# Patient Record
Sex: Female | Born: 1940 | Hispanic: No | Smoking: Never smoker
Health system: Southern US, Community
[De-identification: ages and names within clinical notes are randomized; demographics above are authoritative.]

## PROBLEM LIST (undated history)

## (undated) DIAGNOSIS — M1990 Unspecified osteoarthritis, unspecified site: Secondary | ICD-10-CM

## (undated) DIAGNOSIS — I10 Essential (primary) hypertension: Secondary | ICD-10-CM

## (undated) DIAGNOSIS — J45909 Unspecified asthma, uncomplicated: Secondary | ICD-10-CM

## (undated) DIAGNOSIS — G1221 Amyotrophic lateral sclerosis: Secondary | ICD-10-CM

## (undated) DIAGNOSIS — K5900 Constipation, unspecified: Secondary | ICD-10-CM

## (undated) DIAGNOSIS — E079 Disorder of thyroid, unspecified: Secondary | ICD-10-CM

## (undated) HISTORY — PX: KNEE ARTHROSCOPY: SHX127

## (undated) HISTORY — PX: JOINT REPLACEMENT: SHX530

## (undated) HISTORY — PX: APPENDECTOMY: SHX54

## (undated) HISTORY — PX: BACK SURGERY: SHX140

---

## 1998-07-23 ENCOUNTER — Inpatient Hospital Stay (HOSPITAL_COMMUNITY): Admission: RE | Admit: 1998-07-23 | Discharge: 1998-07-27 | Payer: Self-pay | Admitting: Neurosurgery

## 1999-09-22 ENCOUNTER — Encounter: Admission: RE | Admit: 1999-09-22 | Discharge: 1999-09-22 | Payer: Self-pay | Admitting: Neurosurgery

## 1999-09-22 ENCOUNTER — Encounter: Payer: Self-pay | Admitting: Neurosurgery

## 2000-07-21 ENCOUNTER — Ambulatory Visit (HOSPITAL_COMMUNITY): Admission: RE | Admit: 2000-07-21 | Discharge: 2000-07-21 | Payer: Self-pay | Admitting: Gastroenterology

## 2000-08-25 ENCOUNTER — Encounter: Payer: Self-pay | Admitting: Gastroenterology

## 2000-08-25 ENCOUNTER — Ambulatory Visit (HOSPITAL_COMMUNITY): Admission: RE | Admit: 2000-08-25 | Discharge: 2000-08-25 | Payer: Self-pay | Admitting: Gastroenterology

## 2001-06-29 ENCOUNTER — Encounter: Payer: Self-pay | Admitting: Internal Medicine

## 2001-06-29 ENCOUNTER — Ambulatory Visit (HOSPITAL_COMMUNITY): Admission: RE | Admit: 2001-06-29 | Discharge: 2001-06-29 | Payer: Self-pay | Admitting: Internal Medicine

## 2004-12-29 ENCOUNTER — Ambulatory Visit (HOSPITAL_COMMUNITY): Admission: RE | Admit: 2004-12-29 | Discharge: 2004-12-29 | Payer: Self-pay | Admitting: Obstetrics and Gynecology

## 2005-01-05 ENCOUNTER — Ambulatory Visit: Payer: Self-pay | Admitting: Orthopedic Surgery

## 2005-07-06 ENCOUNTER — Ambulatory Visit: Payer: Self-pay | Admitting: Orthopedic Surgery

## 2005-07-14 ENCOUNTER — Ambulatory Visit: Payer: Self-pay | Admitting: Orthopedic Surgery

## 2005-07-14 ENCOUNTER — Ambulatory Visit (HOSPITAL_COMMUNITY): Admission: RE | Admit: 2005-07-14 | Discharge: 2005-07-14 | Payer: Self-pay | Admitting: Orthopedic Surgery

## 2005-07-16 ENCOUNTER — Ambulatory Visit: Payer: Self-pay | Admitting: Orthopedic Surgery

## 2005-07-17 ENCOUNTER — Encounter (HOSPITAL_COMMUNITY): Admission: RE | Admit: 2005-07-17 | Discharge: 2005-08-16 | Payer: Self-pay | Admitting: Orthopedic Surgery

## 2005-08-06 ENCOUNTER — Ambulatory Visit: Payer: Self-pay | Admitting: Orthopedic Surgery

## 2005-08-18 ENCOUNTER — Encounter (HOSPITAL_COMMUNITY): Admission: RE | Admit: 2005-08-18 | Discharge: 2005-08-29 | Payer: Self-pay | Admitting: Orthopedic Surgery

## 2005-08-31 ENCOUNTER — Ambulatory Visit: Payer: Self-pay | Admitting: Orthopedic Surgery

## 2005-10-12 ENCOUNTER — Ambulatory Visit: Payer: Self-pay | Admitting: Orthopedic Surgery

## 2005-10-19 ENCOUNTER — Ambulatory Visit: Payer: Self-pay | Admitting: Orthopedic Surgery

## 2005-10-26 ENCOUNTER — Ambulatory Visit: Payer: Self-pay | Admitting: Orthopedic Surgery

## 2005-11-19 ENCOUNTER — Ambulatory Visit: Payer: Self-pay | Admitting: Orthopedic Surgery

## 2005-12-10 ENCOUNTER — Ambulatory Visit: Payer: Self-pay | Admitting: Orthopedic Surgery

## 2005-12-23 ENCOUNTER — Ambulatory Visit: Payer: Self-pay | Admitting: Orthopedic Surgery

## 2005-12-30 ENCOUNTER — Ambulatory Visit: Payer: Self-pay | Admitting: Orthopedic Surgery

## 2006-07-22 ENCOUNTER — Encounter: Payer: Self-pay | Admitting: Orthopedic Surgery

## 2006-07-22 ENCOUNTER — Ambulatory Visit (HOSPITAL_COMMUNITY): Admission: RE | Admit: 2006-07-22 | Discharge: 2006-07-22 | Payer: Self-pay | Admitting: Internal Medicine

## 2008-02-10 ENCOUNTER — Other Ambulatory Visit: Admission: RE | Admit: 2008-02-10 | Discharge: 2008-02-10 | Payer: Self-pay | Admitting: Obstetrics & Gynecology

## 2008-04-02 ENCOUNTER — Ambulatory Visit (HOSPITAL_COMMUNITY): Admission: RE | Admit: 2008-04-02 | Discharge: 2008-04-02 | Payer: Self-pay | Admitting: Cardiology

## 2008-06-25 ENCOUNTER — Ambulatory Visit: Payer: Self-pay | Admitting: Orthopedic Surgery

## 2008-06-25 DIAGNOSIS — M1710 Unilateral primary osteoarthritis, unspecified knee: Secondary | ICD-10-CM

## 2008-06-25 DIAGNOSIS — IMO0002 Reserved for concepts with insufficient information to code with codable children: Secondary | ICD-10-CM | POA: Insufficient documentation

## 2008-06-25 DIAGNOSIS — M25559 Pain in unspecified hip: Secondary | ICD-10-CM | POA: Insufficient documentation

## 2008-06-25 DIAGNOSIS — M76899 Other specified enthesopathies of unspecified lower limb, excluding foot: Secondary | ICD-10-CM | POA: Insufficient documentation

## 2008-07-20 ENCOUNTER — Encounter: Payer: Self-pay | Admitting: Orthopedic Surgery

## 2011-02-17 ENCOUNTER — Emergency Department (HOSPITAL_BASED_OUTPATIENT_CLINIC_OR_DEPARTMENT_OTHER): Payer: Medicare Other

## 2011-02-17 ENCOUNTER — Emergency Department (HOSPITAL_BASED_OUTPATIENT_CLINIC_OR_DEPARTMENT_OTHER)
Admission: EM | Admit: 2011-02-17 | Discharge: 2011-02-17 | Disposition: A | Discharge status: Home / Self Care | Payer: Medicare Other | Attending: Emergency Medicine | Admitting: Emergency Medicine

## 2011-02-17 DIAGNOSIS — M79609 Pain in unspecified limb: Secondary | ICD-10-CM

## 2011-02-17 DIAGNOSIS — E039 Hypothyroidism, unspecified: Secondary | ICD-10-CM | POA: Insufficient documentation

## 2011-02-17 DIAGNOSIS — R079 Chest pain, unspecified: Secondary | ICD-10-CM

## 2011-02-17 DIAGNOSIS — I10 Essential (primary) hypertension: Secondary | ICD-10-CM | POA: Insufficient documentation

## 2011-02-17 DIAGNOSIS — Z79899 Other long term (current) drug therapy: Secondary | ICD-10-CM | POA: Insufficient documentation

## 2011-02-17 DIAGNOSIS — K219 Gastro-esophageal reflux disease without esophagitis: Secondary | ICD-10-CM | POA: Insufficient documentation

## 2011-02-17 DIAGNOSIS — Z8739 Personal history of other diseases of the musculoskeletal system and connective tissue: Secondary | ICD-10-CM | POA: Insufficient documentation

## 2011-02-17 LAB — DIFFERENTIAL
Basophils Relative: 0 % (ref 0–1)
Eosinophils Absolute: 0.2 10*3/uL (ref 0.0–0.7)
Eosinophils Relative: 2 % (ref 0–5)
Lymphs Abs: 2.5 10*3/uL (ref 0.7–4.0)
Monocytes Relative: 9 % (ref 3–12)

## 2011-02-17 LAB — BASIC METABOLIC PANEL
BUN: 23 mg/dL (ref 6–23)
Creatinine, Ser: 0.7 mg/dL (ref 0.4–1.2)
GFR calc Af Amer: >60 mL/min (ref >60)
GFR calc non Af Amer: >60 mL/min (ref >60)
Potassium: 3.7 mEq/L (ref 3.5–5.1)

## 2011-02-17 LAB — POCT CARDIAC MARKERS: Myoglobin, poc: 61.6 ng/mL (ref 12–200)

## 2011-02-17 LAB — CBC
MCH: 29.4 pg (ref 26.0–34.0)
MCHC: 34.9 g/dL (ref 30.0–36.0)
MCV: 84.3 fL (ref 78.0–100.0)
Platelets: 281 10*3/uL (ref 150–400)
RDW: 13.6 % (ref 11.5–15.5)

## 2011-03-06 ENCOUNTER — Inpatient Hospital Stay (HOSPITAL_BASED_OUTPATIENT_CLINIC_OR_DEPARTMENT_OTHER)
Admission: RE | Admit: 2011-03-06 | Discharge: 2011-03-06 | Disposition: A | Discharge status: Home / Self Care | Payer: Medicare Other | Source: Ambulatory Visit | Attending: Cardiology | Admitting: Cardiology

## 2011-03-06 DIAGNOSIS — R072 Precordial pain: Secondary | ICD-10-CM | POA: Insufficient documentation

## 2011-03-06 DIAGNOSIS — I2510 Atherosclerotic heart disease of native coronary artery without angina pectoris: Secondary | ICD-10-CM | POA: Insufficient documentation

## 2011-03-09 ENCOUNTER — Other Ambulatory Visit (HOSPITAL_COMMUNITY): Payer: Self-pay | Admitting: Gastroenterology

## 2011-03-09 DIAGNOSIS — R110 Nausea: Secondary | ICD-10-CM

## 2011-03-10 ENCOUNTER — Inpatient Hospital Stay (HOSPITAL_BASED_OUTPATIENT_CLINIC_OR_DEPARTMENT_OTHER): Admission: RE | Admit: 2011-03-10 | Payer: Medicare Other | Source: Ambulatory Visit | Admitting: Cardiology

## 2011-03-17 ENCOUNTER — Other Ambulatory Visit (HOSPITAL_COMMUNITY): Payer: Medicare Other

## 2011-04-02 NOTE — Cardiovascular Report (Signed)
NAMEJIZELLE, Rebecca Osborn                ACCOUNT NO.:  [id number]  MEDICAL RECORD NO.:  [id number]          PATIENT TYPE:  LOCATION:                                 FACILITY:  PHYSICIAN:  Finnis Colee N. Sharyn Lull, M.D. DATE OF BIRTH:  March 23, 1941  DATE OF PROCEDURE:  03/06/2011 DATE OF DISCHARGE:                           CARDIAC CATHETERIZATION   PROCEDURE:  Left cardiac catheterization with selective left and right coronary angiography, left ventriculography graft via right groin using Judkins technique.  INDICATIONS FOR PROCEDURE:  Rebecca Osborn is a 70 year old Asian female with past medical history significant for hypertension, hypothyroidism, GERD, and positive family history of coronary artery disease who complains of recurrent left infraclavicular and left precordial chest pain and left arm pain off and on.  Denies any nausea, vomiting, or diaphoresis.  Also complains of exertional dyspnea with minimal exertion.  Denies PND, orthopnea, or leg swelling.  Denies palpitation, lightheadedness, or syncope.  The patient had Persantine Myoview in May 2009 which was positive by EKG criteria, but showed no evidence of reversible ischemia on nuclear scan and was treated medically, but due to recurrent chest pain and left arm pain, the patient is referred for left cath.  PROCEDURE:  After obtaining informed consent, the patient was brought to the Cath Lab and was placed on fluoroscopy table.  Right groin was prepped and draped in the usual fashion.  Xylocaine 1% was used for local anesthesia in the right groin.  With the help of thin-wall needle, a 4-French arterial sheath was placed.  The sheath was aspirated and flushed.  Next, 4-French left Judkins catheter was advanced over the wire under fluoroscopic guidance up to the ascending aorta.  Wire was pulled out.  The catheter was aspirated and connected to the manifold. Catheter was further advanced and engaged into left coronary ostium. Multiple  views of the left system were taken.  Next, catheter was disengaged and was pulled out over the wire and was replaced with 4- [place] 3-D right diagnostic catheter which was advanced over the wire under fluoroscopic guidance up to the ascending aorta.  Wire was pulled out.  The catheter was aspirated and connected to the manifold. Catheter was further advanced and engaged into right coronary ostium. Multiple views of the right system were taken.  Next, the catheter was disengaged and was pulled out over the wire and was replaced with 4- [place] pigtail catheter which was advanced over the wire under fluoroscopic guidance up to the ascending aorta.  Wire was pulled out. The catheter was aspirated and connected to the manifold.  Catheter was further advanced across aortic valve into the LV.  LV pressures were recorded.  Next, LV-graphy was done in 30-degree RAO position.  Post- angiographic pressures were recorded from LV and then pullback pressures were recorded from the aorta.  There was no gradient across aortic valve.  Next, pigtail catheter was pulled out over the wire.  Sheaths were aspirated and flushed.  FINDINGS:  LV showed good LV systolic function.  EF of 55-60%.  Left main was patent.  LAD has 5-10% mid stenosis.  Diagonal 1 to diagonal 3  were small which were patent.  Left circumflex was large which was patent which tapers down in AV groove after giving off OM-2.  OM-1 is very, very small.  OM-2 is moderate size which is patent, which bifurcates in inferior and superior branch.  RCA is patent.  PDA and PLV branches are patent.  The patient tolerated the procedure well.  There are no complications.  The patient was transferred to the recovery room in stable condition.     Eduardo Osier. Sharyn Lull, M.D.     MNH/MEDQ  D:  03/06/2011  T:  03/07/2011  Job:  [id number]  Electronically Signed by Rinaldo Cloud M.D. on 04/02/2011 08:33:14 AM

## 2011-04-17 NOTE — Op Note (Signed)
[name], [name]                ACCOUNT NO.:  [id number]   MEDICAL RECORD NO.:  [id number]          PATIENT TYPE:  AMB   LOCATION:  DAY                           FACILITY:  [facility]   PHYSICIAN:  Vickki Hearing, M.D.DATE OF BIRTH:  [birth year]   DATE OF PROCEDURE:  07/14/2005  DATE OF DISCHARGE:  07/14/2005                                 OPERATIVE REPORT   PREOPERATIVE DIAGNOSIS:  Medial meniscal tear, left knee.   POSTOPERATIVE DIAGNOSIS:  Osteoarthritis, left knee.   PROCEDURE:  Arthroscopy, left knee, limited synovectomy.   SURGEON:  Dr. Romeo Apple.   ANESTHETIC:  General.   OPERATIVE FINDINGS:  The patient had complete bare bone on the trochlear  portion of her femur. Medial femoral condyle had grade 1 changes. The  patella had grade 1 changes. The entire rest of the knee was normal. There  were no loose bodies, but there were two pedunculated cartilaginous  structures which were attached to the synovium which were removed.   The patient identified as Rebecca Osborn, left knee marked for surgery,  countersigned by the surgeon, and history and physical updated. She was  taken to the operating room for general anesthesia. Left leg was prepped and  draped in sterile technique. Time-out was taken as required. Two-portal  arthroscopy was done. Diagnostic arthroscopy was performed first. Probe was  placed medially. Structures were palpated. We also looked through the notch  posteriorly to look for loose bodies. We only found two enlarged structures  on the medial side which appeared to be pedunculated and appeared to have  the potential to become loose bodies. We removed those. We did a limited  synovectomy including removal of medial plica, irrigated the knee, and  closed with Steri-Strips and injected 30 cc of Marcaine plain. We also  addressed the knee sterilely, placed a CryoCuff, extubated the patient, and  took her to the recovery room in stable condition      Vickki Hearing, M.D.  Electronically Signed     SEH/MEDQ  D:  07/15/2005  T:  07/16/2005  Job:  [id number]

## 2011-04-17 NOTE — Procedures (Signed)
[place]. [facility]  Patient:    Rebecca Osborn, Rebecca Osborn                       MRN: [id number] Proc. Date: 07/21/00 Adm. Date:  [id number] Attending:  Charna Elizabeth CC:         Dr. Theodis Blaze, [place], [place]   Procedure Report  DATE OF BIRTH:  May 13, 1942  PROCEDURE PERFORMED:  Colonoscopy.  ENDOSCOPIST:  Anselmo Rod, M.D.  INSTRUMENT USED:  Olympus video colonoscope.  INDICATIONS:  Rectal bleeding with history of chronic constipation in a 70 year old [place] female.  Rule out polyps, masses, hemorrhoids, etc.  PREPROCEDURE PREPARATION:  Informed consent was procured from the patient. The patient was fasted for 8 hours prior to the procedure and prepped with a bottle of magnesium citrate and a gallon of NuLytely the night prior to the procedure.  PREPROCEDURE PHYSICAL:  Patient has stable vital signs.  NECK: Supple.  CHEST:  Clear to auscultation. S1, S2 regular.  ABDOMEN:  Soft with normal abdominal bowel sounds.  DESCRIPTION OF PROCEDURE:  The patient was placed in left lateral decubitus position and sedated with 70 mg of Demerol and 7 mg of Versed intravenously. Once the patient was adequately sedated and maintained on low-flow oxygen and continuous cardiac monitoring, the Olympus video colonoscope was advanced from the rectum to the cecum without difficulty.  The patient had an excellent prep.  Small external hemorrhoids were appreciated on anal inspection and small nonbleeding internal hemorrhoids were seen on retroflexion.  The rest of the colonic mucosa appeared healthy up to the cecum.  The appendiceal orifice and the ileocecal valve were clearly visualized.  The patient had no evidence of masses, polyps, erosions, ulcerations or diverticular disease.  The patient tolerated the procedure well without complication.  IMPRESSION:  Normal colonoscopy except for small nonbleeding internal and external hemorrhoids.  RECOMMENDATIONS: 1. A  high-fiber diet has been recommended for the patient along with liberal    fluid intake. 2. Stool softeners are to be used at nighttime until her fiber intake is    maximized and bowel movements are regulated. 3. Outpatient follow-up is advised with Dr. Minus Liberty in the next 3-4 weeks. DD:  07/22/00 TD:  07/22/00 Job: [id number]

## 2011-04-17 NOTE — H&P (Signed)
Rebecca Osborn, Rebecca Osborn                ACCOUNT NO.:  [id number]   MEDICAL RECORD NO.:  [id number]          PATIENT TYPE:  AMB   LOCATION:  DAY                           FACILITY:  [facility]   PHYSICIAN:  Vickki Hearing, M.D.DATE OF BIRTH:  1941-01-17   DATE OF ADMISSION:  DATE OF DISCHARGE:  LH                                HISTORY & PHYSICAL   CHIEF COMPLAINT:  Pain and catching, left knee.   HISTORY:  She is 70 years old.  She had a lumbar fusion.  Has some right hip  bursitis and mechanical problems in the left knee with loose body documented  by MRI with more frequent giving away, and this has become worse since  February 2006.  She presents now for arthroscopy, left knee, removal of  loose body, and resection of torn medial meniscus.  She has occasionally in  the past complained of some shortness of breath.  She has some thyroid  disease.  Her other systems were normal.   She has no allergies.   She does take Prevacid, Zyrtec, thyroxine, verapamil.   She is status post appendectomy.   Family history of asthma.  Family physician is Dr. Felecia Shelling.   SOCIAL HISTORY:  She is married. 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  She does not  smoke or drink.  She does report some caffeine use via 3 cups of tea per  day.   PHYSICAL EXAMINATION:  VITAL SIGNS:  Her weight is 170.  Her pulse is 72,.  Her respiratory rate is 18.  GENERAL:  Her appearance is normal.  Body habitus is ectomorphic.  CARDIOVASCULAR:  No swelling or varicosities.  Pulses and temperature  normal.  LYMPH NODES:  Benign.  SKIN:  Normal.  NEUROPSYCH:  Exam shows no abnormal findings.  LUNGS:  Normal respirations with clear breath sounds.  HEART:  Rate and rhythm are normal.  MUSCULOSKELETAL:  Exam shows medial joint line tenderness.  Full range of  motion.  Good stability to the knee and tenderness along the medial  compartment.  There are some mild crepitations.   She had an MRI done several months ago, and it showed she had a  loose body.  We do not have that film with [place].  I did review it February 2006.   IMPRESSION:  Loose body, torn medial meniscus, left knee.   PLAN:  Arthroscopy, left knee.      Vickki Hearing, M.D.  Electronically Signed     SEH/MEDQ  D:  07/13/2005  T:  07/13/2005  Job:  [id number]

## 2011-12-08 DIAGNOSIS — R319 Hematuria, unspecified: Secondary | ICD-10-CM | POA: Diagnosis not present

## 2011-12-08 DIAGNOSIS — K5900 Constipation, unspecified: Secondary | ICD-10-CM | POA: Diagnosis not present

## 2011-12-08 DIAGNOSIS — R3915 Urgency of urination: Secondary | ICD-10-CM | POA: Diagnosis not present

## 2011-12-08 DIAGNOSIS — R350 Frequency of micturition: Secondary | ICD-10-CM | POA: Diagnosis not present

## 2011-12-30 DIAGNOSIS — Z1231 Encounter for screening mammogram for malignant neoplasm of breast: Secondary | ICD-10-CM | POA: Diagnosis not present

## 2011-12-30 DIAGNOSIS — Z13820 Encounter for screening for osteoporosis: Secondary | ICD-10-CM | POA: Diagnosis not present

## 2011-12-30 DIAGNOSIS — M899 Disorder of bone, unspecified: Secondary | ICD-10-CM | POA: Diagnosis not present

## 2011-12-30 DIAGNOSIS — M949 Disorder of cartilage, unspecified: Secondary | ICD-10-CM | POA: Diagnosis not present

## 2012-01-12 DIAGNOSIS — R3915 Urgency of urination: Secondary | ICD-10-CM | POA: Diagnosis not present

## 2012-01-12 DIAGNOSIS — R350 Frequency of micturition: Secondary | ICD-10-CM | POA: Diagnosis not present

## 2012-01-12 DIAGNOSIS — R319 Hematuria, unspecified: Secondary | ICD-10-CM | POA: Diagnosis not present

## 2012-01-12 DIAGNOSIS — K5900 Constipation, unspecified: Secondary | ICD-10-CM | POA: Diagnosis not present

## 2012-01-12 DIAGNOSIS — D259 Leiomyoma of uterus, unspecified: Secondary | ICD-10-CM | POA: Diagnosis not present

## 2012-01-12 DIAGNOSIS — R109 Unspecified abdominal pain: Secondary | ICD-10-CM | POA: Diagnosis not present

## 2012-02-09 ENCOUNTER — Ambulatory Visit: Payer: Self-pay | Admitting: Internal Medicine

## 2012-02-09 DIAGNOSIS — G4700 Insomnia, unspecified: Secondary | ICD-10-CM | POA: Diagnosis not present

## 2012-02-09 DIAGNOSIS — J45909 Unspecified asthma, uncomplicated: Secondary | ICD-10-CM | POA: Diagnosis not present

## 2012-02-09 DIAGNOSIS — M1990 Unspecified osteoarthritis, unspecified site: Secondary | ICD-10-CM | POA: Diagnosis not present

## 2012-02-09 DIAGNOSIS — M76899 Other specified enthesopathies of unspecified lower limb, excluding foot: Secondary | ICD-10-CM | POA: Diagnosis not present

## 2012-02-09 DIAGNOSIS — R059 Cough, unspecified: Secondary | ICD-10-CM | POA: Diagnosis not present

## 2012-02-09 DIAGNOSIS — E039 Hypothyroidism, unspecified: Secondary | ICD-10-CM | POA: Diagnosis not present

## 2012-02-09 DIAGNOSIS — R05 Cough: Secondary | ICD-10-CM | POA: Diagnosis not present

## 2012-02-09 DIAGNOSIS — M25559 Pain in unspecified hip: Secondary | ICD-10-CM | POA: Diagnosis not present

## 2012-02-09 DIAGNOSIS — M25569 Pain in unspecified knee: Secondary | ICD-10-CM | POA: Diagnosis not present

## 2012-04-20 DIAGNOSIS — M2240 Chondromalacia patellae, unspecified knee: Secondary | ICD-10-CM | POA: Diagnosis not present

## 2012-04-20 DIAGNOSIS — M25569 Pain in unspecified knee: Secondary | ICD-10-CM | POA: Diagnosis not present

## 2012-04-20 DIAGNOSIS — M25469 Effusion, unspecified knee: Secondary | ICD-10-CM | POA: Diagnosis not present

## 2012-05-06 DIAGNOSIS — IMO0002 Reserved for concepts with insufficient information to code with codable children: Secondary | ICD-10-CM | POA: Diagnosis not present

## 2012-05-06 DIAGNOSIS — M25569 Pain in unspecified knee: Secondary | ICD-10-CM | POA: Diagnosis not present

## 2012-05-06 DIAGNOSIS — M1710 Unilateral primary osteoarthritis, unspecified knee: Secondary | ICD-10-CM | POA: Diagnosis not present

## 2012-05-12 DIAGNOSIS — M1710 Unilateral primary osteoarthritis, unspecified knee: Secondary | ICD-10-CM | POA: Diagnosis not present

## 2012-05-12 DIAGNOSIS — IMO0002 Reserved for concepts with insufficient information to code with codable children: Secondary | ICD-10-CM | POA: Diagnosis not present

## 2012-05-12 DIAGNOSIS — M25569 Pain in unspecified knee: Secondary | ICD-10-CM | POA: Diagnosis not present

## 2012-05-20 DIAGNOSIS — M1710 Unilateral primary osteoarthritis, unspecified knee: Secondary | ICD-10-CM | POA: Diagnosis not present

## 2012-07-19 DIAGNOSIS — M1710 Unilateral primary osteoarthritis, unspecified knee: Secondary | ICD-10-CM | POA: Diagnosis not present

## 2012-07-25 DIAGNOSIS — M1710 Unilateral primary osteoarthritis, unspecified knee: Secondary | ICD-10-CM | POA: Diagnosis not present

## 2012-07-29 DIAGNOSIS — M25569 Pain in unspecified knee: Secondary | ICD-10-CM | POA: Diagnosis not present

## 2012-07-30 DIAGNOSIS — M25569 Pain in unspecified knee: Secondary | ICD-10-CM | POA: Diagnosis not present

## 2012-09-16 DIAGNOSIS — M25569 Pain in unspecified knee: Secondary | ICD-10-CM | POA: Diagnosis not present

## 2012-09-17 DIAGNOSIS — I10 Essential (primary) hypertension: Secondary | ICD-10-CM | POA: Diagnosis not present

## 2012-09-17 DIAGNOSIS — E039 Hypothyroidism, unspecified: Secondary | ICD-10-CM | POA: Diagnosis not present

## 2012-09-17 DIAGNOSIS — R5383 Other fatigue: Secondary | ICD-10-CM | POA: Diagnosis not present

## 2012-09-17 DIAGNOSIS — R252 Cramp and spasm: Secondary | ICD-10-CM | POA: Diagnosis not present

## 2012-09-17 DIAGNOSIS — I872 Venous insufficiency (chronic) (peripheral): Secondary | ICD-10-CM | POA: Diagnosis not present

## 2012-09-17 DIAGNOSIS — E669 Obesity, unspecified: Secondary | ICD-10-CM | POA: Diagnosis not present

## 2012-09-17 DIAGNOSIS — R5381 Other malaise: Secondary | ICD-10-CM | POA: Diagnosis not present

## 2012-10-03 DIAGNOSIS — Z1211 Encounter for screening for malignant neoplasm of colon: Secondary | ICD-10-CM | POA: Diagnosis not present

## 2012-10-03 DIAGNOSIS — R143 Flatulence: Secondary | ICD-10-CM | POA: Diagnosis not present

## 2012-10-03 DIAGNOSIS — R141 Gas pain: Secondary | ICD-10-CM | POA: Diagnosis not present

## 2012-10-03 DIAGNOSIS — K219 Gastro-esophageal reflux disease without esophagitis: Secondary | ICD-10-CM | POA: Diagnosis not present

## 2012-10-03 DIAGNOSIS — K5900 Constipation, unspecified: Secondary | ICD-10-CM | POA: Diagnosis not present

## 2012-10-11 DIAGNOSIS — K219 Gastro-esophageal reflux disease without esophagitis: Secondary | ICD-10-CM | POA: Diagnosis not present

## 2012-10-11 DIAGNOSIS — E039 Hypothyroidism, unspecified: Secondary | ICD-10-CM | POA: Diagnosis not present

## 2012-10-11 DIAGNOSIS — I10 Essential (primary) hypertension: Secondary | ICD-10-CM | POA: Diagnosis not present

## 2012-10-11 DIAGNOSIS — R300 Dysuria: Secondary | ICD-10-CM | POA: Diagnosis not present

## 2012-10-11 DIAGNOSIS — R809 Proteinuria, unspecified: Secondary | ICD-10-CM | POA: Diagnosis not present

## 2012-10-11 DIAGNOSIS — Z23 Encounter for immunization: Secondary | ICD-10-CM | POA: Diagnosis not present

## 2012-10-23 ENCOUNTER — Emergency Department (HOSPITAL_BASED_OUTPATIENT_CLINIC_OR_DEPARTMENT_OTHER)
Admission: EM | Admit: 2012-10-23 | Discharge: 2012-10-23 | Disposition: A | Discharge status: Home / Self Care | Payer: Medicare Other | Attending: Emergency Medicine | Admitting: Emergency Medicine

## 2012-10-23 ENCOUNTER — Emergency Department (HOSPITAL_BASED_OUTPATIENT_CLINIC_OR_DEPARTMENT_OTHER): Payer: Medicare Other

## 2012-10-23 ENCOUNTER — Encounter (HOSPITAL_BASED_OUTPATIENT_CLINIC_OR_DEPARTMENT_OTHER): Payer: Self-pay | Admitting: Emergency Medicine

## 2012-10-23 DIAGNOSIS — K7689 Other specified diseases of liver: Secondary | ICD-10-CM | POA: Diagnosis not present

## 2012-10-23 DIAGNOSIS — E669 Obesity, unspecified: Secondary | ICD-10-CM | POA: Diagnosis not present

## 2012-10-23 DIAGNOSIS — R110 Nausea: Secondary | ICD-10-CM | POA: Insufficient documentation

## 2012-10-23 DIAGNOSIS — J45909 Unspecified asthma, uncomplicated: Secondary | ICD-10-CM | POA: Insufficient documentation

## 2012-10-23 DIAGNOSIS — I10 Essential (primary) hypertension: Secondary | ICD-10-CM | POA: Insufficient documentation

## 2012-10-23 DIAGNOSIS — Z8739 Personal history of other diseases of the musculoskeletal system and connective tissue: Secondary | ICD-10-CM | POA: Diagnosis not present

## 2012-10-23 DIAGNOSIS — R1013 Epigastric pain: Secondary | ICD-10-CM | POA: Diagnosis not present

## 2012-10-23 DIAGNOSIS — E079 Disorder of thyroid, unspecified: Secondary | ICD-10-CM | POA: Insufficient documentation

## 2012-10-23 DIAGNOSIS — Z79899 Other long term (current) drug therapy: Secondary | ICD-10-CM | POA: Insufficient documentation

## 2012-10-23 HISTORY — DX: Disorder of thyroid, unspecified: E07.9

## 2012-10-23 HISTORY — DX: Essential (primary) hypertension: I10

## 2012-10-23 HISTORY — DX: Unspecified osteoarthritis, unspecified site: M19.90

## 2012-10-23 HISTORY — DX: Unspecified asthma, uncomplicated: J45.909

## 2012-10-23 LAB — URINALYSIS, ROUTINE W REFLEX MICROSCOPIC
Bilirubin Urine: NEGATIVE
Glucose, UA: NEGATIVE mg/dL
Nitrite: NEGATIVE
Specific Gravity, Urine: 1.007 (ref 1.005–1.030)
pH: 6.5 (ref 5.0–8.0)

## 2012-10-23 LAB — CBC WITH DIFFERENTIAL/PLATELET
Eosinophils Relative: 1 % (ref 0–5)
HCT: 37.1 % (ref 36.0–46.0)
Hemoglobin: 13 g/dL (ref 12.0–15.0)
Lymphocytes Relative: 30 % (ref 12–46)
Lymphs Abs: 2.5 10*3/uL (ref 0.7–4.0)
MCV: 85.1 fL (ref 78.0–100.0)
Platelets: 336 10*3/uL (ref 150–400)
RBC: 4.36 MIL/uL (ref 3.87–5.11)
WBC: 8.4 10*3/uL (ref 4.0–10.5)

## 2012-10-23 LAB — COMPREHENSIVE METABOLIC PANEL
ALT: 22 U/L (ref 0–35)
Alkaline Phosphatase: 68 U/L (ref 39–117)
CO2: 25 mEq/L (ref 19–32)
Calcium: 10.3 mg/dL (ref 8.4–10.5)
GFR calc Af Amer: 85 mL/min — ABNORMAL LOW (ref >90)
GFR calc non Af Amer: 74 mL/min — ABNORMAL LOW (ref >90)
Glucose, Bld: 100 mg/dL — ABNORMAL HIGH (ref 70–99)
Potassium: 3.7 mEq/L (ref 3.5–5.1)
Sodium: 138 mEq/L (ref 135–145)

## 2012-10-23 MED ORDER — GI COCKTAIL ~~LOC~~
30.0000 mL | Freq: Once | ORAL | Status: AC
  Administered 2012-10-23: 30 mL via ORAL
  Filled 2012-10-23: qty 30

## 2012-10-23 MED ORDER — FAMOTIDINE IN NACL 20-0.9 MG/50ML-% IV SOLN
20.0000 mg | Freq: Once | INTRAVENOUS | Status: AC
  Administered 2012-10-23: 20 mg via INTRAVENOUS
  Filled 2012-10-23: qty 50

## 2012-10-23 MED ORDER — PANTOPRAZOLE SODIUM 40 MG IV SOLR
40.0000 mg | Freq: Once | INTRAVENOUS | Status: AC
  Administered 2012-10-23: 40 mg via INTRAVENOUS
  Filled 2012-10-23: qty 40

## 2012-10-23 MED ORDER — TRAMADOL HCL 50 MG PO TABS: 50.0000 mg | ORAL_TABLET | Freq: Four times a day (QID) | ORAL | Status: DC | PRN

## 2012-10-23 NOTE — ED Notes (Signed)
burning in diffuse abdomen with reflux, pt taknig nexium; constipation 3 weeks ago; nausea, no vomiting do diarrhea, but looser than normal stools; abd pain dexribed as cramping. denies fever but has had chills. Denies bloody or black stools.

## 2012-10-23 NOTE — ED Provider Notes (Signed)
History     CSN: [id number]  Arrival date & time 10/23/12  1351   First MD Initiated Contact with Patient 10/23/12 1419      Chief Complaint  Patient presents with  . Abdominal Pain    burning in diffuse abdomen with reflux, pt taknig nexium; constipation 3 weeks ago; nausea, no vomiting do diarrhea, but looser than normal stools; abd pain dexribed as cramping. denies fever but has had chills.     (Consider location/radiation/quality/duration/timing/severity/associated sxs/prior treatment) HPI History provided by pt and her son.  Patient's son reports chronic epigastric pain, for which she is scheduled to have an endoscopy for further evaluation on 11/10/12.  Pt reports that pain acutely worsened 4 days ago.  Describes as burning that is aggravated by eating, occasionally radiates into chest and is associated w/ mild nausea.  Non-positional and non-exertional.  No relief w/ nexium and carafate.  Denies associated fever, cough, SOB, vomiting, diarrhea, hematochezia/melena, and urinary sx, w/ exception of isolated episode of dysuria 2 days ago.  Past abd surgeries include appendectomy and hysterectomy.  Dr. Loreta Ave contacted ED today and spoke w/ Dr. Bernette Mayers.  She recommends CT abdomen. Past Medical History  Diagnosis Date  . Hypertension   . Thyroid disease   . Arthritis   . Asthma     Past Surgical History  Procedure Date  . Joint replacement   . Appendectomy     Family History  Problem Relation Age of Onset  . Asthma Brother   . Hypertension Brother     History  Substance Use Topics  . Smoking status: Never Smoker   . Smokeless tobacco: Not on file  . Alcohol Use: No    OB History    Grav Para Term Preterm Abortions TAB SAB Ect Mult Living                  Review of Systems  All other systems reviewed and are negative.    Allergies  Penicillins  Home Medications  No current outpatient prescriptions on file.  BP 150/77  Pulse 65  Temp 97.7 F (36.5 C)  (Oral)  Resp 20  SpO2 99%  Physical Exam  Nursing note and vitals reviewed. Constitutional: She is oriented to person, place, and time. She appears well-developed and well-nourished. No distress.  HENT:  Head: Normocephalic and atraumatic.  Eyes:       Normal appearance  Neck: Normal range of motion.  Cardiovascular: Normal rate and regular rhythm.   Pulmonary/Chest: Effort normal and breath sounds normal. No respiratory distress. She exhibits no tenderness.  Abdominal: Soft. Bowel sounds are normal. She exhibits no distension and no mass. There is no tenderness. There is no rebound and no guarding.       obese  Genitourinary:       No CVA tenderness  Musculoskeletal: Normal range of motion.  Neurological: She is alert and oriented to person, place, and time.  Skin: Skin is warm and dry. No rash noted.  Psychiatric: She has a normal mood and affect. Her behavior is normal.    ED Course  Procedures (including critical care time)   Date: 10/23/2012  Rate: 59  Rhythm: sinus bradycardia  QRS Axis: normal  Intervals: normal  ST/T Wave abnormalities: nonspecific T wave changes  Conduction Disutrbances:none  Narrative Interpretation:   Old EKG Reviewed: unchanged   Labs Reviewed  URINALYSIS, ROUTINE W REFLEX MICROSCOPIC - Abnormal; Notable for the following:    Hgb urine dipstick TRACE (*)  All other components within normal limits  COMPREHENSIVE METABOLIC PANEL - Abnormal; Notable for the following:    Glucose, Bld 100 (*)     GFR calc non Af Amer 74 (*)     GFR calc Af Amer 85 (*)     All other components within normal limits  URINE MICROSCOPIC-ADD ON  CBC WITH DIFFERENTIAL  LIPASE, BLOOD   US Abdomen Complete  10/23/2012  *RADIOLOGY REPORT*  Clinical Data:  Epigastric pain.  COMPLETE ABDOMINAL ULTRASOUND  Comparison:  None.  Findings:  Gallbladder:  No gallstones, gallbladder wall thickening, or pericholecystic fluid.  Common bile duct:  Measures 0.3 cm.  Liver:   Demonstrates diffusely increased echogenicity consistent with fatty infiltration.  No focal lesion or intrahepatic biliary ductal dilatation.  IVC:  Appears normal.  Pancreas:  No focal abnormality seen.  Spleen:  Measures 7.1 cm and is unremarkable in appearance.  Right Kidney:  Measures 10.7 cm and appears normal.  Left Kidney:  Measures 9.9 cm and appears normal.  Abdominal aorta:  No aneurysm identified.  IMPRESSION: Negative for gallstones or acute abnormality.  Diffuse fatty infiltration of the liver.   Original Report Authenticated By: Holley Dexter, M.D.      1. Epigastric pain       MDM  Pt w/ h/o chronic gastritis presents w/ acutely worsened epigastric pain; radiates into chest, aggravated by eating and associated w/ mild nausea.  Dr. Loreta Ave, GI, spoke with Dr. Bernette Mayers and recommended CT abd/pelvis for further evaluation.  Based on history and exam, CT is not indicated at this time.  [place] ordered to r/o cholelithiasis and negative.  Labs unremarkable as well.  All results discussed with patient and her son.  She received IV protonix and pepcid as well as GI cocktail and pain improved.  Recommended that she continue her nexium and carafate and I prescribed tramadol as well.  She has an endoscopy scheduled for 12/12.  Return precautions discussed.         Otilio Miu, [place] 10/23/12 2257

## 2012-10-24 NOTE — ED Provider Notes (Signed)
Medical screening examination/treatment/procedure(s) were performed by non-physician practitioner and as supervising physician I was immediately available for consultation/collaboration.   Shelda Jakes, MD 10/24/12 [phone]

## 2012-11-10 DIAGNOSIS — Z1211 Encounter for screening for malignant neoplasm of colon: Secondary | ICD-10-CM | POA: Diagnosis not present

## 2012-11-10 DIAGNOSIS — K219 Gastro-esophageal reflux disease without esophagitis: Secondary | ICD-10-CM | POA: Diagnosis not present

## 2012-11-10 DIAGNOSIS — K2980 Duodenitis without bleeding: Secondary | ICD-10-CM | POA: Diagnosis not present

## 2012-11-10 DIAGNOSIS — K5900 Constipation, unspecified: Secondary | ICD-10-CM | POA: Diagnosis not present

## 2012-11-10 DIAGNOSIS — D126 Benign neoplasm of colon, unspecified: Secondary | ICD-10-CM | POA: Diagnosis not present

## 2012-12-05 DIAGNOSIS — H10029 Other mucopurulent conjunctivitis, unspecified eye: Secondary | ICD-10-CM | POA: Diagnosis not present

## 2012-12-12 DIAGNOSIS — I10 Essential (primary) hypertension: Secondary | ICD-10-CM | POA: Diagnosis not present

## 2012-12-12 DIAGNOSIS — R0602 Shortness of breath: Secondary | ICD-10-CM | POA: Diagnosis not present

## 2012-12-12 DIAGNOSIS — J309 Allergic rhinitis, unspecified: Secondary | ICD-10-CM | POA: Diagnosis not present

## 2012-12-12 DIAGNOSIS — N390 Urinary tract infection, site not specified: Secondary | ICD-10-CM | POA: Diagnosis not present

## 2012-12-12 DIAGNOSIS — E782 Mixed hyperlipidemia: Secondary | ICD-10-CM | POA: Diagnosis not present

## 2013-03-06 DIAGNOSIS — M1990 Unspecified osteoarthritis, unspecified site: Secondary | ICD-10-CM | POA: Diagnosis not present

## 2013-03-06 DIAGNOSIS — M25569 Pain in unspecified knee: Secondary | ICD-10-CM | POA: Diagnosis not present

## 2013-03-06 DIAGNOSIS — M25519 Pain in unspecified shoulder: Secondary | ICD-10-CM | POA: Diagnosis not present

## 2013-03-06 DIAGNOSIS — M359 Systemic involvement of connective tissue, unspecified: Secondary | ICD-10-CM | POA: Diagnosis not present

## 2013-03-13 DIAGNOSIS — M25519 Pain in unspecified shoulder: Secondary | ICD-10-CM | POA: Diagnosis not present

## 2013-03-20 DIAGNOSIS — M25519 Pain in unspecified shoulder: Secondary | ICD-10-CM | POA: Diagnosis not present

## 2013-04-11 DIAGNOSIS — M25519 Pain in unspecified shoulder: Secondary | ICD-10-CM | POA: Diagnosis not present

## 2013-04-11 DIAGNOSIS — T7840XA Allergy, unspecified, initial encounter: Secondary | ICD-10-CM | POA: Diagnosis not present

## 2013-05-26 DIAGNOSIS — R05 Cough: Secondary | ICD-10-CM | POA: Diagnosis not present

## 2013-05-26 DIAGNOSIS — J45909 Unspecified asthma, uncomplicated: Secondary | ICD-10-CM | POA: Diagnosis not present

## 2013-05-26 DIAGNOSIS — R350 Frequency of micturition: Secondary | ICD-10-CM | POA: Diagnosis not present

## 2013-05-26 DIAGNOSIS — R059 Cough, unspecified: Secondary | ICD-10-CM | POA: Diagnosis not present

## 2013-07-26 DIAGNOSIS — M1990 Unspecified osteoarthritis, unspecified site: Secondary | ICD-10-CM | POA: Diagnosis not present

## 2013-07-26 DIAGNOSIS — M25569 Pain in unspecified knee: Secondary | ICD-10-CM | POA: Diagnosis not present

## 2013-09-07 DIAGNOSIS — R609 Edema, unspecified: Secondary | ICD-10-CM | POA: Diagnosis not present

## 2013-09-07 DIAGNOSIS — M79609 Pain in unspecified limb: Secondary | ICD-10-CM | POA: Diagnosis not present

## 2013-09-07 DIAGNOSIS — M47812 Spondylosis without myelopathy or radiculopathy, cervical region: Secondary | ICD-10-CM | POA: Diagnosis not present

## 2013-09-07 DIAGNOSIS — M629 Disorder of muscle, unspecified: Secondary | ICD-10-CM | POA: Diagnosis not present

## 2013-09-07 DIAGNOSIS — M5030 Other cervical disc degeneration, unspecified cervical region: Secondary | ICD-10-CM | POA: Diagnosis not present

## 2013-09-07 DIAGNOSIS — M542 Cervicalgia: Secondary | ICD-10-CM | POA: Diagnosis not present

## 2013-10-02 DIAGNOSIS — Z23 Encounter for immunization: Secondary | ICD-10-CM | POA: Diagnosis not present

## 2013-10-02 DIAGNOSIS — I10 Essential (primary) hypertension: Secondary | ICD-10-CM | POA: Diagnosis not present

## 2013-10-02 DIAGNOSIS — R300 Dysuria: Secondary | ICD-10-CM | POA: Diagnosis not present

## 2013-10-02 DIAGNOSIS — E039 Hypothyroidism, unspecified: Secondary | ICD-10-CM | POA: Diagnosis not present

## 2013-10-03 DIAGNOSIS — I10 Essential (primary) hypertension: Secondary | ICD-10-CM | POA: Diagnosis not present

## 2013-10-03 DIAGNOSIS — E782 Mixed hyperlipidemia: Secondary | ICD-10-CM | POA: Diagnosis not present

## 2013-10-03 DIAGNOSIS — Z79899 Other long term (current) drug therapy: Secondary | ICD-10-CM | POA: Diagnosis not present

## 2013-10-03 DIAGNOSIS — E039 Hypothyroidism, unspecified: Secondary | ICD-10-CM | POA: Diagnosis not present

## 2013-10-25 DIAGNOSIS — IMO0002 Reserved for concepts with insufficient information to code with codable children: Secondary | ICD-10-CM | POA: Diagnosis not present

## 2013-10-25 DIAGNOSIS — M25569 Pain in unspecified knee: Secondary | ICD-10-CM | POA: Diagnosis not present

## 2013-10-25 DIAGNOSIS — M1710 Unilateral primary osteoarthritis, unspecified knee: Secondary | ICD-10-CM | POA: Diagnosis not present

## 2013-12-21 DIAGNOSIS — M25569 Pain in unspecified knee: Secondary | ICD-10-CM | POA: Diagnosis not present

## 2013-12-26 DIAGNOSIS — M25569 Pain in unspecified knee: Secondary | ICD-10-CM | POA: Diagnosis not present

## 2014-01-02 DIAGNOSIS — H2510 Age-related nuclear cataract, unspecified eye: Secondary | ICD-10-CM | POA: Diagnosis not present

## 2014-01-02 DIAGNOSIS — H04129 Dry eye syndrome of unspecified lacrimal gland: Secondary | ICD-10-CM | POA: Diagnosis not present

## 2014-01-02 DIAGNOSIS — H43399 Other vitreous opacities, unspecified eye: Secondary | ICD-10-CM | POA: Diagnosis not present

## 2014-01-03 DIAGNOSIS — G500 Trigeminal neuralgia: Secondary | ICD-10-CM | POA: Diagnosis not present

## 2014-01-04 DIAGNOSIS — M25569 Pain in unspecified knee: Secondary | ICD-10-CM | POA: Diagnosis not present

## 2014-01-08 DIAGNOSIS — R51 Headache: Secondary | ICD-10-CM | POA: Diagnosis not present

## 2014-01-09 DIAGNOSIS — M25569 Pain in unspecified knee: Secondary | ICD-10-CM | POA: Diagnosis not present

## 2014-02-01 DIAGNOSIS — G4489 Other headache syndrome: Secondary | ICD-10-CM | POA: Diagnosis not present

## 2014-02-01 DIAGNOSIS — R109 Unspecified abdominal pain: Secondary | ICD-10-CM | POA: Diagnosis not present

## 2014-02-01 DIAGNOSIS — E039 Hypothyroidism, unspecified: Secondary | ICD-10-CM | POA: Diagnosis not present

## 2014-02-01 DIAGNOSIS — I10 Essential (primary) hypertension: Secondary | ICD-10-CM | POA: Diagnosis not present

## 2014-02-02 DIAGNOSIS — R109 Unspecified abdominal pain: Secondary | ICD-10-CM | POA: Diagnosis not present

## 2014-04-11 ENCOUNTER — Emergency Department (HOSPITAL_BASED_OUTPATIENT_CLINIC_OR_DEPARTMENT_OTHER): Payer: Medicare Other

## 2014-04-11 ENCOUNTER — Emergency Department (HOSPITAL_BASED_OUTPATIENT_CLINIC_OR_DEPARTMENT_OTHER)
Admission: EM | Admit: 2014-04-11 | Discharge: 2014-04-11 | Disposition: A | Discharge status: Home / Self Care | Payer: Medicare Other | Attending: Emergency Medicine | Admitting: Emergency Medicine

## 2014-04-11 ENCOUNTER — Encounter (HOSPITAL_BASED_OUTPATIENT_CLINIC_OR_DEPARTMENT_OTHER): Payer: Self-pay | Admitting: Emergency Medicine

## 2014-04-11 DIAGNOSIS — Z87442 Personal history of urinary calculi: Secondary | ICD-10-CM | POA: Diagnosis not present

## 2014-04-11 DIAGNOSIS — J45909 Unspecified asthma, uncomplicated: Secondary | ICD-10-CM | POA: Insufficient documentation

## 2014-04-11 DIAGNOSIS — R072 Precordial pain: Secondary | ICD-10-CM | POA: Diagnosis not present

## 2014-04-11 DIAGNOSIS — E079 Disorder of thyroid, unspecified: Secondary | ICD-10-CM | POA: Diagnosis not present

## 2014-04-11 DIAGNOSIS — I10 Essential (primary) hypertension: Secondary | ICD-10-CM | POA: Insufficient documentation

## 2014-04-11 DIAGNOSIS — R079 Chest pain, unspecified: Secondary | ICD-10-CM

## 2014-04-11 DIAGNOSIS — Z79899 Other long term (current) drug therapy: Secondary | ICD-10-CM | POA: Diagnosis not present

## 2014-04-11 DIAGNOSIS — Z8739 Personal history of other diseases of the musculoskeletal system and connective tissue: Secondary | ICD-10-CM | POA: Diagnosis not present

## 2014-04-11 DIAGNOSIS — Z880 Allergy status to penicillin: Secondary | ICD-10-CM | POA: Insufficient documentation

## 2014-04-11 LAB — CBC
HEMATOCRIT: 37.3 % (ref 36.0–46.0)
Hemoglobin: 12.8 g/dL (ref 12.0–15.0)
MCH: 29.9 pg (ref 26.0–34.0)
MCHC: 34.3 g/dL (ref 30.0–36.0)
MCV: 87.1 fL (ref 78.0–100.0)
PLATELETS: 299 10*3/uL (ref 150–400)
RBC: 4.28 MIL/uL (ref 3.87–5.11)
RDW: 14.7 % (ref 11.5–15.5)
WBC: 8.2 10*3/uL (ref 4.0–10.5)

## 2014-04-11 LAB — COMPREHENSIVE METABOLIC PANEL
ALT: 23 U/L (ref 0–35)
AST: 22 U/L (ref 0–37)
Albumin: 4.2 g/dL (ref 3.5–5.2)
Alkaline Phosphatase: 72 U/L (ref 39–117)
BUN: 20 mg/dL (ref 6–23)
CALCIUM: 10.3 mg/dL (ref 8.4–10.5)
CO2: 24 meq/L (ref 19–32)
Chloride: 104 mEq/L (ref 96–112)
Creatinine, Ser: 0.7 mg/dL (ref 0.50–1.10)
GFR calc Af Amer: >90 mL/min (ref >90)
GFR calc non Af Amer: 86 mL/min — ABNORMAL LOW (ref >90)
Glucose, Bld: 99 mg/dL (ref 70–99)
Potassium: 3.9 mEq/L (ref 3.7–5.3)
Sodium: 143 mEq/L (ref 137–147)
Total Bilirubin: 0.3 mg/dL (ref 0.3–1.2)
Total Protein: 7.3 g/dL (ref 6.0–8.3)

## 2014-04-11 LAB — TROPONIN I

## 2014-04-11 NOTE — ED Notes (Signed)
Pt c/o chest pain " goes and comes" x 3 days today left arm numbness

## 2014-04-11 NOTE — ED Provider Notes (Addendum)
CSN: [id number]     Arrival date & time 04/11/14  1248 History   First MD Initiated Contact with Patient 04/11/14 1303     Chief Complaint  Patient presents with  . Chest Pain     (Consider location/radiation/quality/duration/timing/severity/associated sxs/prior Treatment) HPI Comments: Patient is a 73 year old female with history of hypertension. She presents today with complaints of tightness in her chest and neck that has been coming and going for the past 3 days. She denies any injury or trauma. She denies any shortness of breath, nausea, diaphoresis. She denies any exertional symptoms. She has seen Dr. Terrence Dupont in the past Caryl Pina had a heart cath 3 years ago. This revealed essentially normal coronary arteries.  Patient is a 73 y.o. female presenting with chest pain. The history is provided by the patient.  Chest Pain Pain location:  Substernal area Pain quality: pressure   Pain radiates to:  Does not radiate Pain radiates to the back: no   Pain severity:  Moderate Onset quality:  Sudden Duration:  3 days Timing:  Intermittent Progression:  Unchanged Chronicity:  New Relieved by:  Nothing Worsened by:  Nothing tried Ineffective treatments:  None tried   Past Medical History  Diagnosis Date  . Hypertension   . Thyroid disease   . Arthritis   . Asthma    Past Surgical History  Procedure Laterality Date  . Joint replacement    . Appendectomy     Family History  Problem Relation Age of Onset  . Asthma Brother   . Hypertension Brother    History  Substance Use Topics  . Smoking status: Never Smoker   . Smokeless tobacco: Not on file  . Alcohol Use: No   OB History   Grav Para Term Preterm Abortions TAB SAB Ect Mult Living                 Review of Systems  Cardiovascular: Positive for chest pain.  All other systems reviewed and are negative.     Allergies  Penicillins  Home Medications   Prior to Admission medications   Medication Sig Start Date End  Date Taking? Authorizing Provider  amLODipine (NORVASC) 10 MG tablet Take 10 mg by mouth daily.    Historical Provider, MD  bisoprolol-hydrochlorothiazide [facility]) 2.5-6.25 MG per tablet Take 1 tablet by mouth daily.    Historical Provider, MD  esomeprazole (NEXIUM) 40 MG capsule Take 40 mg by mouth daily before breakfast.    Historical Provider, MD  levothyroxine (SYNTHROID, LEVOTHROID) 88 MCG tablet Take 88 mcg by mouth daily.    Historical Provider, MD  sucralfate (CARAFATE) 1 GM/10ML suspension Take 1 g by mouth 4 (four) times daily.    Historical Provider, MD  traMADol (ULTRAM) 50 MG tablet Take 1 tablet (50 mg total) by mouth every 6 (six) hours as needed for pain. 10/23/12   Arville Lime Schinlever, PA-C   BP 149/74  Pulse 72  Temp(Src) 98 F (36.7 C)  Resp 16  Ht 5' (1.524 m)  Wt 175 lb (79.379 kg)  BMI 34.18 kg/m2  SpO2 99% Physical Exam  Nursing note and vitals reviewed. Constitutional: She is oriented to person, place, and time. She appears well-developed and well-nourished. No distress.  HENT:  Head: Normocephalic and atraumatic.  Neck: Normal range of motion. Neck supple.  Cardiovascular: Normal rate and regular rhythm.  Exam reveals no gallop and no friction rub.   No murmur heard. Pulmonary/Chest: Effort normal and breath sounds normal. No respiratory distress.  She has no wheezes.  Abdominal: Soft. Bowel sounds are normal. She exhibits no distension. There is no tenderness.  Musculoskeletal: Normal range of motion.  Neurological: She is alert and oriented to person, place, and time.  Skin: Skin is warm and dry. She is not diaphoretic.    ED Course  Procedures (including critical care time) Labs Review Labs Reviewed  COMPREHENSIVE METABOLIC PANEL - Abnormal; Notable for the following:    GFR calc non Af Amer 86 (*)    All other components within normal limits  TROPONIN I  CBC    Imaging Review Dg Chest 2 View  04/11/2014   CLINICAL DATA:  Left-sided chest pain  with arm numbness and tingling  EXAM: CHEST  2 VIEW  COMPARISON:  None.  FINDINGS: The lungs are adequately inflated. There is no focal infiltrate. The cardiopericardial silhouette is not enlarged. The pulmonary vascularity is not engorged. The mediastinum is normal in width. There is no pleural effusion or pneumothorax or pneumomediastinum. The observed portions of the bony thorax appear normal. The left shoulder exhibits no significant degenerative change. There is gentle dextroscoliosis of the lumbar spine.  IMPRESSION: There is no evidence of pneumonia nor CHF nor other active cardiopulmonary disease.   Electronically Signed   By: David  Martinique   On: 04/11/2014 13:53     EKG Interpretation   Date/Time:  Wednesday Apr 11 2014 12:55:21 EDT Ventricular Rate:  68 PR Interval:  136 QRS Duration: 90 QT Interval:  428 QTC Calculation: 455 R Axis:   71 Text Interpretation:  Normal sinus rhythm Normal ECG Confirmed by DELOS   MD, Hobson Lax ([id number]) on 04/11/2014 1:19:33 PM      MDM   Final diagnoses:  None    Patient is a 73 year old female who presents with complaints of chest and neck discomfort. She has a history of hypertension but no other cardiac risk factors. Workup reveals an unchanged ekg times 2, negative troponin with 3 days of symptoms, and no other acute abnormality on the chest xray or labs. Reviewing the medical record, she had an essentially normal heart cath three years ago.  I strongly doubt a cardiac etiology.  While in the ED, she experienced "numbness" in the neck and face.  Repeat ekg was unchanged.  I spent a significant amount of time with the patient and family discussing the results of the tests and possible cause of her symptoms.  I have offered to speak with Dr. Terrence Dupont about admission, however the patient declines and would like to follow up on an out patient basis.  Will discharge to home with follow up with Dr. Terrence Dupont.    Veryl Speak, MD 04/11/14 [place], MD 04/11/14 [phone]

## 2014-04-11 NOTE — ED Notes (Signed)
MD at bedside. 

## 2014-04-11 NOTE — Discharge Instructions (Signed)
Follow up with Dr. Terrence Dupont in the next 2-3 days and return to the ER if your symptoms significantly worsen or change.   Chest Pain (Nonspecific) It is often hard to give a specific diagnosis for the cause of chest pain. There is always a chance that your pain could be related to something serious, such as a heart attack or a blood clot in the lungs. You need to follow up with your caregiver for further evaluation. CAUSES   Heartburn.  Pneumonia or bronchitis.  Anxiety or stress.  Inflammation around your heart (pericarditis) or lung (pleuritis or pleurisy).  A blood clot in the lung.  A collapsed lung (pneumothorax). It can develop suddenly on its own (spontaneous pneumothorax) or from injury (trauma) to the chest.  Shingles infection (herpes zoster virus). The chest wall is composed of bones, muscles, and cartilage. Any of these can be the source of the pain.  The bones can be bruised by injury.  The muscles or cartilage can be strained by coughing or overwork.  The cartilage can be affected by inflammation and become sore (costochondritis). DIAGNOSIS  Lab tests or other studies, such as X-rays, electrocardiography, stress testing, or cardiac imaging, may be needed to find the cause of your pain.  TREATMENT   Treatment depends on what may be causing your chest pain. Treatment may include:  Acid blockers for heartburn.  Anti-inflammatory medicine.  Pain medicine for inflammatory conditions.  Antibiotics if an infection is present.  You may be advised to change lifestyle habits. This includes stopping smoking and avoiding alcohol, caffeine, and chocolate.  You may be advised to keep your head raised (elevated) when sleeping. This reduces the chance of acid going backward from your stomach into your esophagus.  Most of the time, nonspecific chest pain will improve within 2 to 3 days with rest and mild pain medicine. HOME CARE INSTRUCTIONS   If antibiotics were  prescribed, take your antibiotics as directed. Finish them even if you start to feel better.  For the next few days, avoid physical activities that bring on chest pain. Continue physical activities as directed.  Do not smoke.  Avoid drinking alcohol.  Only take over-the-counter or prescription medicine for pain, discomfort, or fever as directed by your caregiver.  Follow your caregiver's suggestions for further testing if your chest pain does not go away.  Keep any follow-up appointments you made. If you do not go to an appointment, you could develop lasting (chronic) problems with pain. If there is any problem keeping an appointment, you must call to reschedule. SEEK MEDICAL CARE IF:   You think you are having problems from the medicine you are taking. Read your medicine instructions carefully.  Your chest pain does not go away, even after treatment.  You develop a rash with blisters on your chest. SEEK IMMEDIATE MEDICAL CARE IF:   You have increased chest pain or pain that spreads to your arm, neck, jaw, back, or abdomen.  You develop shortness of breath, an increasing cough, or you are coughing up blood.  You have severe back or abdominal pain, feel nauseous, or vomit.  You develop severe weakness, fainting, or chills.  You have a fever. THIS IS AN EMERGENCY. Do not wait to see if the pain will go away. Get medical help at once. Call your local emergency services (911 in [place] Do not drive yourself to the hospital. MAKE SURE YOU:   Understand these instructions.  Will watch your condition.  Will get help right  away if you are not doing well or get worse. Document Released: 08/26/2005 Document Revised: 02/08/2012 Document Reviewed: 06/21/2008 [facility] Patient Information 2014 [place].

## 2014-04-11 NOTE — ED Notes (Addendum)
Pt c/o pain to back of neck, bilat jaw-NSR on monitor-EDP [place] notified-orders for 2nd EKG

## 2014-07-04 DIAGNOSIS — M76829 Posterior tibial tendinitis, unspecified leg: Secondary | ICD-10-CM | POA: Diagnosis not present

## 2014-08-02 DIAGNOSIS — E669 Obesity, unspecified: Secondary | ICD-10-CM | POA: Diagnosis not present

## 2014-08-02 DIAGNOSIS — R1013 Epigastric pain: Secondary | ICD-10-CM | POA: Diagnosis not present

## 2014-08-02 DIAGNOSIS — R1011 Right upper quadrant pain: Secondary | ICD-10-CM | POA: Diagnosis not present

## 2014-08-02 DIAGNOSIS — R110 Nausea: Secondary | ICD-10-CM | POA: Diagnosis not present

## 2014-08-02 DIAGNOSIS — K219 Gastro-esophageal reflux disease without esophagitis: Secondary | ICD-10-CM | POA: Diagnosis not present

## 2014-08-03 ENCOUNTER — Other Ambulatory Visit: Payer: Self-pay | Admitting: Gastroenterology

## 2014-08-03 DIAGNOSIS — R110 Nausea: Secondary | ICD-10-CM

## 2014-08-03 DIAGNOSIS — R1011 Right upper quadrant pain: Secondary | ICD-10-CM

## 2014-08-07 DIAGNOSIS — R300 Dysuria: Secondary | ICD-10-CM | POA: Diagnosis not present

## 2014-08-07 DIAGNOSIS — Z0000 Encounter for general adult medical examination without abnormal findings: Secondary | ICD-10-CM | POA: Diagnosis not present

## 2014-08-07 DIAGNOSIS — N390 Urinary tract infection, site not specified: Secondary | ICD-10-CM | POA: Diagnosis not present

## 2014-08-07 DIAGNOSIS — E039 Hypothyroidism, unspecified: Secondary | ICD-10-CM | POA: Diagnosis not present

## 2014-08-07 DIAGNOSIS — I10 Essential (primary) hypertension: Secondary | ICD-10-CM | POA: Diagnosis not present

## 2014-08-07 DIAGNOSIS — R109 Unspecified abdominal pain: Secondary | ICD-10-CM | POA: Diagnosis not present

## 2014-08-07 DIAGNOSIS — R319 Hematuria, unspecified: Secondary | ICD-10-CM | POA: Diagnosis not present

## 2014-08-13 DIAGNOSIS — R319 Hematuria, unspecified: Secondary | ICD-10-CM | POA: Diagnosis not present

## 2014-08-13 DIAGNOSIS — N390 Urinary tract infection, site not specified: Secondary | ICD-10-CM | POA: Diagnosis not present

## 2014-08-16 ENCOUNTER — Encounter (HOSPITAL_COMMUNITY)
Admission: RE | Admit: 2014-08-16 | Discharge: 2014-08-16 | Disposition: A | Discharge status: Home / Self Care | Payer: Medicare Other | Source: Ambulatory Visit | Attending: Gastroenterology | Admitting: Gastroenterology

## 2014-08-16 ENCOUNTER — Ambulatory Visit (HOSPITAL_COMMUNITY)
Admission: RE | Admit: 2014-08-16 | Discharge: 2014-08-16 | Disposition: A | Discharge status: Home / Self Care | Payer: Medicare Other | Source: Ambulatory Visit | Attending: Gastroenterology | Admitting: Gastroenterology

## 2014-08-16 DIAGNOSIS — K219 Gastro-esophageal reflux disease without esophagitis: Secondary | ICD-10-CM | POA: Insufficient documentation

## 2014-08-16 DIAGNOSIS — R110 Nausea: Secondary | ICD-10-CM

## 2014-08-16 DIAGNOSIS — R1011 Right upper quadrant pain: Secondary | ICD-10-CM

## 2014-08-16 DIAGNOSIS — K7689 Other specified diseases of liver: Secondary | ICD-10-CM | POA: Diagnosis not present

## 2014-08-16 MED ORDER — SINCALIDE 5 MCG IJ SOLR
0.0200 ug/kg | Freq: Once | INTRAMUSCULAR | Status: AC
  Administered 2014-08-16: 1.59 ug via INTRAVENOUS

## 2014-08-16 MED ORDER — SINCALIDE 5 MCG IJ SOLR
INTRAMUSCULAR | Status: AC
  Administered 2014-08-16: 1.59 ug via INTRAVENOUS
  Filled 2014-08-16: qty 5

## 2014-08-16 MED ORDER — TECHNETIUM TC 99M MEBROFENIN IV KIT
5.0000 | PACK | Freq: Once | INTRAVENOUS | Status: AC | PRN
  Administered 2014-08-16: 5 via INTRAVENOUS

## 2014-09-18 DIAGNOSIS — N390 Urinary tract infection, site not specified: Secondary | ICD-10-CM | POA: Diagnosis not present

## 2014-09-18 DIAGNOSIS — E039 Hypothyroidism, unspecified: Secondary | ICD-10-CM | POA: Diagnosis not present

## 2014-09-18 DIAGNOSIS — R319 Hematuria, unspecified: Secondary | ICD-10-CM | POA: Diagnosis not present

## 2014-09-18 DIAGNOSIS — R10819 Abdominal tenderness, unspecified site: Secondary | ICD-10-CM | POA: Diagnosis not present

## 2014-09-18 DIAGNOSIS — J309 Allergic rhinitis, unspecified: Secondary | ICD-10-CM | POA: Diagnosis not present

## 2014-09-18 DIAGNOSIS — K219 Gastro-esophageal reflux disease without esophagitis: Secondary | ICD-10-CM | POA: Diagnosis not present

## 2014-09-18 DIAGNOSIS — I10 Essential (primary) hypertension: Secondary | ICD-10-CM | POA: Diagnosis not present

## 2014-11-16 DIAGNOSIS — N3281 Overactive bladder: Secondary | ICD-10-CM | POA: Diagnosis not present

## 2014-11-16 DIAGNOSIS — R319 Hematuria, unspecified: Secondary | ICD-10-CM | POA: Diagnosis not present

## 2014-11-16 DIAGNOSIS — M545 Low back pain: Secondary | ICD-10-CM | POA: Diagnosis not present

## 2014-11-16 DIAGNOSIS — K5900 Constipation, unspecified: Secondary | ICD-10-CM | POA: Diagnosis not present

## 2014-11-17 DIAGNOSIS — R350 Frequency of micturition: Secondary | ICD-10-CM | POA: Diagnosis not present

## 2014-11-21 DIAGNOSIS — N889 Noninflammatory disorder of cervix uteri, unspecified: Secondary | ICD-10-CM | POA: Diagnosis not present

## 2014-11-21 DIAGNOSIS — Z124 Encounter for screening for malignant neoplasm of cervix: Secondary | ICD-10-CM | POA: Diagnosis not present

## 2014-11-21 DIAGNOSIS — N9489 Other specified conditions associated with female genital organs and menstrual cycle: Secondary | ICD-10-CM | POA: Diagnosis not present

## 2014-11-21 DIAGNOSIS — Z780 Asymptomatic menopausal state: Secondary | ICD-10-CM | POA: Diagnosis not present

## 2014-11-21 DIAGNOSIS — R109 Unspecified abdominal pain: Secondary | ICD-10-CM | POA: Diagnosis not present

## 2014-11-21 DIAGNOSIS — Z1151 Encounter for screening for human papillomavirus (HPV): Secondary | ICD-10-CM | POA: Diagnosis not present

## 2014-11-29 DIAGNOSIS — N889 Noninflammatory disorder of cervix uteri, unspecified: Secondary | ICD-10-CM | POA: Diagnosis not present

## 2014-11-29 DIAGNOSIS — Z1151 Encounter for screening for human papillomavirus (HPV): Secondary | ICD-10-CM | POA: Diagnosis not present

## 2014-11-29 DIAGNOSIS — Z124 Encounter for screening for malignant neoplasm of cervix: Secondary | ICD-10-CM | POA: Diagnosis not present

## 2014-12-07 DIAGNOSIS — R312 Other microscopic hematuria: Secondary | ICD-10-CM | POA: Diagnosis not present

## 2014-12-11 DIAGNOSIS — R102 Pelvic and perineal pain: Secondary | ICD-10-CM | POA: Diagnosis not present

## 2014-12-14 DIAGNOSIS — N3281 Overactive bladder: Secondary | ICD-10-CM | POA: Diagnosis not present

## 2014-12-14 DIAGNOSIS — K5900 Constipation, unspecified: Secondary | ICD-10-CM | POA: Diagnosis not present

## 2014-12-14 DIAGNOSIS — M545 Low back pain: Secondary | ICD-10-CM | POA: Diagnosis not present

## 2014-12-14 DIAGNOSIS — R312 Other microscopic hematuria: Secondary | ICD-10-CM | POA: Diagnosis not present

## 2014-12-14 DIAGNOSIS — R319 Hematuria, unspecified: Secondary | ICD-10-CM | POA: Diagnosis not present

## 2014-12-18 DIAGNOSIS — Z1231 Encounter for screening mammogram for malignant neoplasm of breast: Secondary | ICD-10-CM | POA: Diagnosis not present

## 2014-12-18 DIAGNOSIS — Z13820 Encounter for screening for osteoporosis: Secondary | ICD-10-CM | POA: Diagnosis not present

## 2014-12-18 DIAGNOSIS — Z780 Asymptomatic menopausal state: Secondary | ICD-10-CM | POA: Diagnosis not present

## 2015-01-04 DIAGNOSIS — R109 Unspecified abdominal pain: Secondary | ICD-10-CM | POA: Diagnosis not present

## 2015-01-09 DIAGNOSIS — K2970 Gastritis, unspecified, without bleeding: Secondary | ICD-10-CM | POA: Diagnosis not present

## 2015-01-09 DIAGNOSIS — R1013 Epigastric pain: Secondary | ICD-10-CM | POA: Diagnosis not present

## 2015-01-09 DIAGNOSIS — K317 Polyp of stomach and duodenum: Secondary | ICD-10-CM | POA: Diagnosis not present

## 2015-01-09 DIAGNOSIS — K219 Gastro-esophageal reflux disease without esophagitis: Secondary | ICD-10-CM | POA: Diagnosis not present

## 2015-01-18 DIAGNOSIS — M545 Low back pain: Secondary | ICD-10-CM | POA: Diagnosis not present

## 2015-01-18 DIAGNOSIS — K5900 Constipation, unspecified: Secondary | ICD-10-CM | POA: Diagnosis not present

## 2015-01-18 DIAGNOSIS — R312 Other microscopic hematuria: Secondary | ICD-10-CM | POA: Diagnosis not present

## 2015-01-18 DIAGNOSIS — N3281 Overactive bladder: Secondary | ICD-10-CM | POA: Diagnosis not present

## 2015-01-23 DIAGNOSIS — Z23 Encounter for immunization: Secondary | ICD-10-CM | POA: Diagnosis not present

## 2015-09-20 DIAGNOSIS — M25669 Stiffness of unspecified knee, not elsewhere classified: Secondary | ICD-10-CM | POA: Diagnosis not present

## 2015-09-20 DIAGNOSIS — J3089 Other allergic rhinitis: Secondary | ICD-10-CM | POA: Diagnosis not present

## 2015-09-20 DIAGNOSIS — E039 Hypothyroidism, unspecified: Secondary | ICD-10-CM | POA: Diagnosis not present

## 2015-09-20 DIAGNOSIS — M25569 Pain in unspecified knee: Secondary | ICD-10-CM | POA: Diagnosis not present

## 2015-09-20 DIAGNOSIS — R5383 Other fatigue: Secondary | ICD-10-CM | POA: Diagnosis not present

## 2015-10-15 DIAGNOSIS — K219 Gastro-esophageal reflux disease without esophagitis: Secondary | ICD-10-CM | POA: Diagnosis not present

## 2015-10-15 DIAGNOSIS — E039 Hypothyroidism, unspecified: Secondary | ICD-10-CM | POA: Diagnosis not present

## 2015-10-15 DIAGNOSIS — E782 Mixed hyperlipidemia: Secondary | ICD-10-CM | POA: Diagnosis not present

## 2015-10-15 DIAGNOSIS — N390 Urinary tract infection, site not specified: Secondary | ICD-10-CM | POA: Diagnosis not present

## 2015-10-15 DIAGNOSIS — I10 Essential (primary) hypertension: Secondary | ICD-10-CM | POA: Diagnosis not present

## 2015-10-29 DIAGNOSIS — Z7409 Other reduced mobility: Secondary | ICD-10-CM | POA: Diagnosis not present

## 2015-10-29 DIAGNOSIS — M25669 Stiffness of unspecified knee, not elsewhere classified: Secondary | ICD-10-CM | POA: Diagnosis not present

## 2015-10-29 DIAGNOSIS — M2550 Pain in unspecified joint: Secondary | ICD-10-CM | POA: Diagnosis not present

## 2015-10-29 DIAGNOSIS — R2689 Other abnormalities of gait and mobility: Secondary | ICD-10-CM | POA: Diagnosis not present

## 2015-11-14 DIAGNOSIS — M25669 Stiffness of unspecified knee, not elsewhere classified: Secondary | ICD-10-CM | POA: Diagnosis not present

## 2015-11-14 DIAGNOSIS — M2550 Pain in unspecified joint: Secondary | ICD-10-CM | POA: Diagnosis not present

## 2015-11-14 DIAGNOSIS — Z7409 Other reduced mobility: Secondary | ICD-10-CM | POA: Diagnosis not present

## 2015-11-14 DIAGNOSIS — R2689 Other abnormalities of gait and mobility: Secondary | ICD-10-CM | POA: Diagnosis not present

## 2015-11-19 DIAGNOSIS — Z7409 Other reduced mobility: Secondary | ICD-10-CM | POA: Diagnosis not present

## 2015-11-19 DIAGNOSIS — M25669 Stiffness of unspecified knee, not elsewhere classified: Secondary | ICD-10-CM | POA: Diagnosis not present

## 2015-11-19 DIAGNOSIS — M2550 Pain in unspecified joint: Secondary | ICD-10-CM | POA: Diagnosis not present

## 2015-11-19 DIAGNOSIS — R2689 Other abnormalities of gait and mobility: Secondary | ICD-10-CM | POA: Diagnosis not present

## 2015-12-11 DIAGNOSIS — R062 Wheezing: Secondary | ICD-10-CM | POA: Diagnosis not present

## 2015-12-11 DIAGNOSIS — R05 Cough: Secondary | ICD-10-CM | POA: Diagnosis not present

## 2015-12-11 DIAGNOSIS — J45901 Unspecified asthma with (acute) exacerbation: Secondary | ICD-10-CM | POA: Diagnosis not present

## 2015-12-28 DIAGNOSIS — H938X1 Other specified disorders of right ear: Secondary | ICD-10-CM | POA: Diagnosis not present

## 2015-12-28 DIAGNOSIS — H8111 Benign paroxysmal vertigo, right ear: Secondary | ICD-10-CM | POA: Diagnosis not present

## 2015-12-28 DIAGNOSIS — M542 Cervicalgia: Secondary | ICD-10-CM | POA: Diagnosis not present

## 2015-12-28 DIAGNOSIS — R51 Headache: Secondary | ICD-10-CM | POA: Diagnosis not present

## 2016-01-17 DIAGNOSIS — N390 Urinary tract infection, site not specified: Secondary | ICD-10-CM | POA: Diagnosis not present

## 2016-01-17 DIAGNOSIS — M150 Primary generalized (osteo)arthritis: Secondary | ICD-10-CM | POA: Diagnosis not present

## 2016-01-17 DIAGNOSIS — D519 Vitamin B12 deficiency anemia, unspecified: Secondary | ICD-10-CM | POA: Diagnosis not present

## 2016-01-17 DIAGNOSIS — N952 Postmenopausal atrophic vaginitis: Secondary | ICD-10-CM | POA: Diagnosis not present

## 2016-01-17 DIAGNOSIS — R319 Hematuria, unspecified: Secondary | ICD-10-CM | POA: Diagnosis not present

## 2016-01-17 DIAGNOSIS — E039 Hypothyroidism, unspecified: Secondary | ICD-10-CM | POA: Diagnosis not present

## 2016-01-22 DIAGNOSIS — E042 Nontoxic multinodular goiter: Secondary | ICD-10-CM | POA: Diagnosis not present

## 2016-01-22 DIAGNOSIS — E034 Atrophy of thyroid (acquired): Secondary | ICD-10-CM | POA: Diagnosis not present

## 2016-01-23 DIAGNOSIS — Z0001 Encounter for general adult medical examination with abnormal findings: Secondary | ICD-10-CM | POA: Diagnosis not present

## 2016-01-23 DIAGNOSIS — E782 Mixed hyperlipidemia: Secondary | ICD-10-CM | POA: Diagnosis not present

## 2016-01-23 DIAGNOSIS — E039 Hypothyroidism, unspecified: Secondary | ICD-10-CM | POA: Diagnosis not present

## 2016-02-14 DIAGNOSIS — R319 Hematuria, unspecified: Secondary | ICD-10-CM | POA: Diagnosis not present

## 2016-02-14 DIAGNOSIS — E039 Hypothyroidism, unspecified: Secondary | ICD-10-CM | POA: Diagnosis not present

## 2016-02-14 DIAGNOSIS — E782 Mixed hyperlipidemia: Secondary | ICD-10-CM | POA: Diagnosis not present

## 2016-02-14 DIAGNOSIS — R10819 Abdominal tenderness, unspecified site: Secondary | ICD-10-CM | POA: Diagnosis not present

## 2016-02-14 DIAGNOSIS — N390 Urinary tract infection, site not specified: Secondary | ICD-10-CM | POA: Diagnosis not present

## 2016-02-28 DIAGNOSIS — R1030 Lower abdominal pain, unspecified: Secondary | ICD-10-CM | POA: Diagnosis not present

## 2016-02-28 DIAGNOSIS — M545 Low back pain: Secondary | ICD-10-CM | POA: Diagnosis not present

## 2016-02-28 DIAGNOSIS — R0782 Intercostal pain: Secondary | ICD-10-CM | POA: Diagnosis not present

## 2016-02-28 DIAGNOSIS — R918 Other nonspecific abnormal finding of lung field: Secondary | ICD-10-CM | POA: Diagnosis not present

## 2016-02-28 DIAGNOSIS — R0602 Shortness of breath: Secondary | ICD-10-CM | POA: Diagnosis not present

## 2016-02-28 DIAGNOSIS — N3001 Acute cystitis with hematuria: Secondary | ICD-10-CM | POA: Diagnosis not present

## 2016-03-05 DIAGNOSIS — R319 Hematuria, unspecified: Secondary | ICD-10-CM | POA: Diagnosis not present

## 2016-03-05 DIAGNOSIS — R10819 Abdominal tenderness, unspecified site: Secondary | ICD-10-CM | POA: Diagnosis not present

## 2016-03-05 DIAGNOSIS — R079 Chest pain, unspecified: Secondary | ICD-10-CM | POA: Diagnosis not present

## 2016-03-05 DIAGNOSIS — N390 Urinary tract infection, site not specified: Secondary | ICD-10-CM | POA: Diagnosis not present

## 2016-03-11 ENCOUNTER — Other Ambulatory Visit: Payer: Self-pay | Admitting: Physician Assistant

## 2016-03-11 ENCOUNTER — Ambulatory Visit
Admission: RE | Admit: 2016-03-11 | Discharge: 2016-03-11 | Disposition: A | Discharge status: Home / Self Care | Payer: Medicare Other | Source: Ambulatory Visit | Attending: Physician Assistant | Admitting: Physician Assistant

## 2016-03-11 DIAGNOSIS — Z09 Encounter for follow-up examination after completed treatment for conditions other than malignant neoplasm: Secondary | ICD-10-CM | POA: Diagnosis not present

## 2016-03-11 DIAGNOSIS — J189 Pneumonia, unspecified organism: Secondary | ICD-10-CM

## 2016-03-11 DIAGNOSIS — Z8701 Personal history of pneumonia (recurrent): Secondary | ICD-10-CM | POA: Diagnosis not present

## 2016-03-11 DIAGNOSIS — R10819 Abdominal tenderness, unspecified site: Secondary | ICD-10-CM | POA: Diagnosis not present

## 2016-03-11 DIAGNOSIS — R05 Cough: Secondary | ICD-10-CM | POA: Diagnosis not present

## 2016-03-19 DIAGNOSIS — I10 Essential (primary) hypertension: Secondary | ICD-10-CM | POA: Diagnosis not present

## 2016-03-19 DIAGNOSIS — F419 Anxiety disorder, unspecified: Secondary | ICD-10-CM | POA: Diagnosis not present

## 2016-03-19 DIAGNOSIS — M5117 Intervertebral disc disorders with radiculopathy, lumbosacral region: Secondary | ICD-10-CM | POA: Diagnosis not present

## 2016-03-19 DIAGNOSIS — N390 Urinary tract infection, site not specified: Secondary | ICD-10-CM | POA: Diagnosis not present

## 2016-03-25 DIAGNOSIS — M7582 Other shoulder lesions, left shoulder: Secondary | ICD-10-CM | POA: Diagnosis not present

## 2016-03-25 DIAGNOSIS — G8929 Other chronic pain: Secondary | ICD-10-CM | POA: Diagnosis not present

## 2016-03-25 DIAGNOSIS — Z9889 Other specified postprocedural states: Secondary | ICD-10-CM | POA: Diagnosis not present

## 2016-03-25 DIAGNOSIS — M545 Low back pain: Secondary | ICD-10-CM | POA: Diagnosis not present

## 2016-04-01 DIAGNOSIS — M7582 Other shoulder lesions, left shoulder: Secondary | ICD-10-CM | POA: Diagnosis not present

## 2016-04-01 DIAGNOSIS — M25512 Pain in left shoulder: Secondary | ICD-10-CM | POA: Diagnosis not present

## 2016-04-01 DIAGNOSIS — M25511 Pain in right shoulder: Secondary | ICD-10-CM | POA: Diagnosis not present

## 2016-04-01 DIAGNOSIS — M6281 Muscle weakness (generalized): Secondary | ICD-10-CM | POA: Diagnosis not present

## 2016-04-08 DIAGNOSIS — M6281 Muscle weakness (generalized): Secondary | ICD-10-CM | POA: Diagnosis not present

## 2016-04-08 DIAGNOSIS — M25512 Pain in left shoulder: Secondary | ICD-10-CM | POA: Diagnosis not present

## 2016-04-08 DIAGNOSIS — M7582 Other shoulder lesions, left shoulder: Secondary | ICD-10-CM | POA: Diagnosis not present

## 2016-04-08 DIAGNOSIS — M25511 Pain in right shoulder: Secondary | ICD-10-CM | POA: Diagnosis not present

## 2016-04-09 ENCOUNTER — Ambulatory Visit: Payer: Medicare Other

## 2016-04-09 ENCOUNTER — Ambulatory Visit (INDEPENDENT_AMBULATORY_CARE_PROVIDER_SITE_OTHER): Payer: Medicare Other | Admitting: Orthopedic Surgery

## 2016-04-09 VITALS — BP 137/87 | HR 84 | Ht 60.0 in | Wt 188.5 lb

## 2016-04-09 DIAGNOSIS — M2242 Chondromalacia patellae, left knee: Secondary | ICD-10-CM | POA: Diagnosis not present

## 2016-04-09 DIAGNOSIS — M25562 Pain in left knee: Secondary | ICD-10-CM

## 2016-04-09 DIAGNOSIS — M7052 Other bursitis of knee, left knee: Secondary | ICD-10-CM | POA: Diagnosis not present

## 2016-04-09 MED ORDER — DICLOFENAC POTASSIUM 50 MG PO TABS: 50.0000 mg | ORAL_TABLET | Freq: Two times a day (BID) | ORAL | Status: DC

## 2016-04-09 NOTE — Patient Instructions (Signed)
ICE 20 MINUTES THREE TIMES DAILY

## 2016-04-09 NOTE — Progress Notes (Signed)
Chief Complaint  Patient presents with  . Knee Pain    Left knee pain    HPI 75 year old female had the left knee arthroscopy did well several years ago. She presents now with acute pain starting 3 days ago medial aspect of her left knee with some grinding and patellofemoral pain also in the left knee. The pain medially is severe the pain anteriorly is mild. This is exacerbated by stairclimbing. She denies trauma Review of Systems  Constitutional: Negative for fever, chills, weight loss and malaise/fatigue.  Musculoskeletal: Positive for joint pain. Negative for falls.  Neurological: Negative for tingling.    Past Medical History  Diagnosis Date  . Hypertension   . Thyroid disease   . Arthritis   . Asthma     Past Surgical History  Procedure Laterality Date  . Joint replacement    . Appendectomy    Arthroscopy left knee   Family History  Problem Relation Age of Onset  . Asthma Brother   . Hypertension Brother    Social History  Substance Use Topics  . Smoking status: Never Smoker   . Smokeless tobacco: Not on file  . Alcohol Use: No    Current outpatient prescriptions:  .  amLODipine (NORVASC) 10 MG tablet, Take 10 mg by mouth daily., Disp: , Rfl:  .  Bioflavonoid Products (BIOFLEX PO), Take by mouth., Disp: , Rfl:  .  esomeprazole (NEXIUM) 40 MG capsule, Take 40 mg by mouth daily before breakfast., Disp: , Rfl:  .  levothyroxine (SYNTHROID, LEVOTHROID) 88 MCG tablet, Take 88 mcg by mouth daily., Disp: , Rfl:  .  Multiple Vitamins-Minerals (MULTIVITAMIN WITH MINERALS) tablet, Take 1 tablet by mouth daily., Disp: , Rfl:  .  traMADol (ULTRAM) 50 MG tablet, Take 1 tablet (50 mg total) by mouth every 6 (six) hours as needed for pain., Disp: 20 tablet, Rfl: 0 .  diclofenac (CATAFLAM) 50 MG tablet, Take 1 tablet (50 mg total) by mouth 2 (two) times daily., Disp: 60 tablet, Rfl: 0  BP 137/87 mmHg  Pulse 84  Ht 5' (1.524 m)  Wt 188 lb 8 oz (85.503 kg)  BMI 36.81  kg/m2  Physical Exam  Constitutional: She is oriented to person, place, and time. She appears well-developed and well-nourished. No distress.  Cardiovascular: Normal rate and intact distal pulses.   Neurological: She is alert and oriented to person, place, and time. She has normal reflexes. She exhibits normal muscle tone. Coordination normal.  Skin: Skin is warm and dry. No rash noted. She is not diaphoretic. No erythema. No pallor.  Psychiatric: She has a normal mood and affect. Her behavior is normal. Judgment and thought content normal.    Ortho Exam Tenderness over the medial portion of both versus crepitance in the right patellofemoral joint with pain joint lines in each knee seems nontender flexion arc and the left nasal 125 right knee 125 as well both knees right and left are stable  Alignment looks normal. She does have some peripheral edema in each leg at the ankle area. Sensation is normal in both.  ASSESSMENT: My personal interpretation of the images:  none  Bursitis left knee patellofemoral arthritis left knee  PLAN Recommend injection patient wanted medication recommend diclofenac 50 mg twice a day and ice 20 minutes 3 times a day

## 2016-04-10 DIAGNOSIS — M25512 Pain in left shoulder: Secondary | ICD-10-CM | POA: Diagnosis not present

## 2016-04-10 DIAGNOSIS — M7582 Other shoulder lesions, left shoulder: Secondary | ICD-10-CM | POA: Diagnosis not present

## 2016-04-10 DIAGNOSIS — M25511 Pain in right shoulder: Secondary | ICD-10-CM | POA: Diagnosis not present

## 2016-04-10 DIAGNOSIS — M6281 Muscle weakness (generalized): Secondary | ICD-10-CM | POA: Diagnosis not present

## 2016-04-15 DIAGNOSIS — M6281 Muscle weakness (generalized): Secondary | ICD-10-CM | POA: Diagnosis not present

## 2016-04-15 DIAGNOSIS — M25511 Pain in right shoulder: Secondary | ICD-10-CM | POA: Diagnosis not present

## 2016-04-15 DIAGNOSIS — M7582 Other shoulder lesions, left shoulder: Secondary | ICD-10-CM | POA: Diagnosis not present

## 2016-04-15 DIAGNOSIS — M25512 Pain in left shoulder: Secondary | ICD-10-CM | POA: Diagnosis not present

## 2016-04-16 DIAGNOSIS — M6281 Muscle weakness (generalized): Secondary | ICD-10-CM | POA: Diagnosis not present

## 2016-04-16 DIAGNOSIS — M25511 Pain in right shoulder: Secondary | ICD-10-CM | POA: Diagnosis not present

## 2016-04-16 DIAGNOSIS — M25512 Pain in left shoulder: Secondary | ICD-10-CM | POA: Diagnosis not present

## 2016-04-16 DIAGNOSIS — M7582 Other shoulder lesions, left shoulder: Secondary | ICD-10-CM | POA: Diagnosis not present

## 2016-04-20 DIAGNOSIS — L299 Pruritus, unspecified: Secondary | ICD-10-CM | POA: Diagnosis not present

## 2016-04-22 DIAGNOSIS — M6281 Muscle weakness (generalized): Secondary | ICD-10-CM | POA: Diagnosis not present

## 2016-04-22 DIAGNOSIS — M25511 Pain in right shoulder: Secondary | ICD-10-CM | POA: Diagnosis not present

## 2016-04-22 DIAGNOSIS — M25512 Pain in left shoulder: Secondary | ICD-10-CM | POA: Diagnosis not present

## 2016-04-22 DIAGNOSIS — M7582 Other shoulder lesions, left shoulder: Secondary | ICD-10-CM | POA: Diagnosis not present

## 2016-04-23 DIAGNOSIS — M25512 Pain in left shoulder: Secondary | ICD-10-CM | POA: Diagnosis not present

## 2016-04-23 DIAGNOSIS — M7582 Other shoulder lesions, left shoulder: Secondary | ICD-10-CM | POA: Diagnosis not present

## 2016-04-23 DIAGNOSIS — M6281 Muscle weakness (generalized): Secondary | ICD-10-CM | POA: Diagnosis not present

## 2016-04-23 DIAGNOSIS — M25511 Pain in right shoulder: Secondary | ICD-10-CM | POA: Diagnosis not present

## 2016-05-15 DIAGNOSIS — R0789 Other chest pain: Secondary | ICD-10-CM | POA: Diagnosis not present

## 2016-05-15 DIAGNOSIS — R079 Chest pain, unspecified: Secondary | ICD-10-CM | POA: Diagnosis not present

## 2016-05-16 DIAGNOSIS — R079 Chest pain, unspecified: Secondary | ICD-10-CM | POA: Diagnosis not present

## 2016-07-21 DIAGNOSIS — K219 Gastro-esophageal reflux disease without esophagitis: Secondary | ICD-10-CM | POA: Diagnosis not present

## 2016-07-21 DIAGNOSIS — R140 Abdominal distension (gaseous): Secondary | ICD-10-CM | POA: Diagnosis not present

## 2016-07-21 DIAGNOSIS — R1013 Epigastric pain: Secondary | ICD-10-CM | POA: Diagnosis not present

## 2016-07-21 DIAGNOSIS — R194 Change in bowel habit: Secondary | ICD-10-CM | POA: Diagnosis not present

## 2016-07-21 DIAGNOSIS — E669 Obesity, unspecified: Secondary | ICD-10-CM | POA: Diagnosis not present

## 2016-08-04 ENCOUNTER — Other Ambulatory Visit: Payer: Self-pay

## 2016-09-09 DIAGNOSIS — M5441 Lumbago with sciatica, right side: Secondary | ICD-10-CM | POA: Diagnosis not present

## 2016-09-24 ENCOUNTER — Other Ambulatory Visit: Payer: Self-pay | Admitting: Nurse Practitioner

## 2016-09-24 ENCOUNTER — Ambulatory Visit
Admission: RE | Admit: 2016-09-24 | Discharge: 2016-09-24 | Disposition: A | Discharge status: Home / Self Care | Payer: Medicare Other | Source: Ambulatory Visit | Attending: Nurse Practitioner | Admitting: Nurse Practitioner

## 2016-09-24 DIAGNOSIS — M25551 Pain in right hip: Secondary | ICD-10-CM | POA: Diagnosis not present

## 2016-09-24 DIAGNOSIS — Z981 Arthrodesis status: Secondary | ICD-10-CM | POA: Diagnosis not present

## 2016-09-24 DIAGNOSIS — M4186 Other forms of scoliosis, lumbar region: Secondary | ICD-10-CM | POA: Diagnosis not present

## 2016-09-24 DIAGNOSIS — M791 Myalgia: Secondary | ICD-10-CM | POA: Diagnosis not present

## 2016-09-24 DIAGNOSIS — M47816 Spondylosis without myelopathy or radiculopathy, lumbar region: Secondary | ICD-10-CM | POA: Insufficient documentation

## 2016-09-24 DIAGNOSIS — R52 Pain, unspecified: Secondary | ICD-10-CM

## 2016-09-24 DIAGNOSIS — Z23 Encounter for immunization: Secondary | ICD-10-CM | POA: Diagnosis not present

## 2016-09-24 DIAGNOSIS — D219 Benign neoplasm of connective and other soft tissue, unspecified: Secondary | ICD-10-CM | POA: Diagnosis not present

## 2016-09-24 DIAGNOSIS — M5117 Intervertebral disc disorders with radiculopathy, lumbosacral region: Secondary | ICD-10-CM | POA: Diagnosis not present

## 2016-09-24 DIAGNOSIS — R0602 Shortness of breath: Secondary | ICD-10-CM | POA: Diagnosis not present

## 2016-09-24 DIAGNOSIS — I10 Essential (primary) hypertension: Secondary | ICD-10-CM | POA: Diagnosis not present

## 2016-09-28 DIAGNOSIS — D509 Iron deficiency anemia, unspecified: Secondary | ICD-10-CM | POA: Diagnosis not present

## 2016-09-28 DIAGNOSIS — E559 Vitamin D deficiency, unspecified: Secondary | ICD-10-CM | POA: Diagnosis not present

## 2016-09-28 DIAGNOSIS — I10 Essential (primary) hypertension: Secondary | ICD-10-CM | POA: Diagnosis not present

## 2016-09-28 DIAGNOSIS — E039 Hypothyroidism, unspecified: Secondary | ICD-10-CM | POA: Diagnosis not present

## 2016-09-29 DIAGNOSIS — Z1231 Encounter for screening mammogram for malignant neoplasm of breast: Secondary | ICD-10-CM | POA: Diagnosis not present

## 2016-10-01 DIAGNOSIS — I700 Atherosclerosis of aorta: Secondary | ICD-10-CM | POA: Diagnosis not present

## 2016-10-01 DIAGNOSIS — R0602 Shortness of breath: Secondary | ICD-10-CM | POA: Diagnosis not present

## 2016-10-08 DIAGNOSIS — M25551 Pain in right hip: Secondary | ICD-10-CM | POA: Diagnosis not present

## 2016-10-08 DIAGNOSIS — G2581 Restless legs syndrome: Secondary | ICD-10-CM | POA: Diagnosis not present

## 2016-10-08 DIAGNOSIS — E039 Hypothyroidism, unspecified: Secondary | ICD-10-CM | POA: Diagnosis not present

## 2016-10-08 DIAGNOSIS — K5900 Constipation, unspecified: Secondary | ICD-10-CM | POA: Diagnosis not present

## 2016-10-08 DIAGNOSIS — I10 Essential (primary) hypertension: Secondary | ICD-10-CM | POA: Diagnosis not present

## 2017-06-26 ENCOUNTER — Emergency Department (HOSPITAL_BASED_OUTPATIENT_CLINIC_OR_DEPARTMENT_OTHER): Payer: Medicare Other

## 2017-06-26 ENCOUNTER — Encounter (HOSPITAL_BASED_OUTPATIENT_CLINIC_OR_DEPARTMENT_OTHER): Payer: Self-pay | Admitting: *Deleted

## 2017-06-26 ENCOUNTER — Emergency Department (HOSPITAL_BASED_OUTPATIENT_CLINIC_OR_DEPARTMENT_OTHER)
Admission: EM | Admit: 2017-06-26 | Discharge: 2017-06-26 | Disposition: A | Discharge status: Home / Self Care | Payer: Medicare Other | Attending: Emergency Medicine | Admitting: Emergency Medicine

## 2017-06-26 DIAGNOSIS — K644 Residual hemorrhoidal skin tags: Secondary | ICD-10-CM | POA: Insufficient documentation

## 2017-06-26 DIAGNOSIS — Z79899 Other long term (current) drug therapy: Secondary | ICD-10-CM | POA: Insufficient documentation

## 2017-06-26 DIAGNOSIS — M549 Dorsalgia, unspecified: Secondary | ICD-10-CM | POA: Diagnosis present

## 2017-06-26 DIAGNOSIS — J45909 Unspecified asthma, uncomplicated: Secondary | ICD-10-CM | POA: Insufficient documentation

## 2017-06-26 DIAGNOSIS — K5900 Constipation, unspecified: Secondary | ICD-10-CM | POA: Diagnosis not present

## 2017-06-26 DIAGNOSIS — E039 Hypothyroidism, unspecified: Secondary | ICD-10-CM | POA: Insufficient documentation

## 2017-06-26 DIAGNOSIS — R1032 Left lower quadrant pain: Secondary | ICD-10-CM | POA: Insufficient documentation

## 2017-06-26 DIAGNOSIS — R109 Unspecified abdominal pain: Secondary | ICD-10-CM | POA: Diagnosis not present

## 2017-06-26 DIAGNOSIS — I10 Essential (primary) hypertension: Secondary | ICD-10-CM | POA: Insufficient documentation

## 2017-06-26 DIAGNOSIS — K648 Other hemorrhoids: Secondary | ICD-10-CM | POA: Diagnosis not present

## 2017-06-26 HISTORY — DX: Constipation, unspecified: K59.00

## 2017-06-26 LAB — CBC WITH DIFFERENTIAL/PLATELET
BASOS ABS: 0 10*3/uL (ref 0.0–0.1)
BASOS PCT: 0 %
EOS PCT: 4 %
Eosinophils Absolute: 0.3 10*3/uL (ref 0.0–0.7)
HEMATOCRIT: 37.6 % (ref 36.0–46.0)
Hemoglobin: 13 g/dL (ref 12.0–15.0)
Lymphocytes Relative: 27 %
Lymphs Abs: 2 10*3/uL (ref 0.7–4.0)
MCH: 30 pg (ref 26.0–34.0)
MCHC: 34.6 g/dL (ref 30.0–36.0)
MCV: 86.6 fL (ref 78.0–100.0)
Monocytes Absolute: 0.7 10*3/uL (ref 0.1–1.0)
Monocytes Relative: 9 %
NEUTROS ABS: 4.5 10*3/uL (ref 1.7–7.7)
Neutrophils Relative %: 60 %
PLATELETS: 295 10*3/uL (ref 150–400)
RBC: 4.34 MIL/uL (ref 3.87–5.11)
RDW: 14.6 % (ref 11.5–15.5)
WBC: 7.5 10*3/uL (ref 4.0–10.5)

## 2017-06-26 LAB — URINALYSIS, ROUTINE W REFLEX MICROSCOPIC
Bilirubin Urine: NEGATIVE
Glucose, UA: NEGATIVE mg/dL
Hgb urine dipstick: NEGATIVE
KETONES UR: NEGATIVE mg/dL
NITRITE: NEGATIVE
PROTEIN: NEGATIVE mg/dL
Specific Gravity, Urine: 1.026 (ref 1.005–1.030)
pH: 6 (ref 5.0–8.0)

## 2017-06-26 LAB — BASIC METABOLIC PANEL
Anion gap: 8 (ref 5–15)
BUN: 22 mg/dL — ABNORMAL HIGH (ref 6–20)
CO2: 26 mmol/L (ref 22–32)
Calcium: 9.2 mg/dL (ref 8.9–10.3)
Chloride: 103 mmol/L (ref 101–111)
Creatinine, Ser: 0.74 mg/dL (ref 0.44–1.00)
GFR calc Af Amer: >60 mL/min (ref >60)
GFR calc non Af Amer: >60 mL/min (ref >60)
Glucose, Bld: 135 mg/dL — ABNORMAL HIGH (ref 65–99)
Potassium: 3.8 mmol/L (ref 3.5–5.1)
Sodium: 137 mmol/L (ref 135–145)

## 2017-06-26 LAB — URINALYSIS, MICROSCOPIC (REFLEX): RBC / HPF: NONE SEEN RBC/hpf (ref 0–5)

## 2017-06-26 MED ORDER — MORPHINE SULFATE (PF) 2 MG/ML IV SOLN
2.0000 mg | Freq: Once | INTRAVENOUS | Status: AC
  Administered 2017-06-26: 2 mg via INTRAVENOUS
  Filled 2017-06-26: qty 1

## 2017-06-26 MED ORDER — IOPAMIDOL (ISOVUE-300) INJECTION 61%
100.0000 mL | Freq: Once | INTRAVENOUS | Status: AC | PRN
  Administered 2017-06-26: 100 mL via INTRAVENOUS

## 2017-06-26 NOTE — Discharge Instructions (Signed)
Take Tylenol as directed for pain. Use Senna as a laxative to help with constipation as needed. Follow-up with Dr.Abboud if not feeling better by next week. Return if concern for any reason

## 2017-06-26 NOTE — ED Triage Notes (Addendum)
Pt reports L lower back pain x3days that's radiating to LLQ. Reports worse with movement. Denies genitourinary symptoms, n/v, fever. Reports taking Vicodin with no relief (last around 0600).

## 2017-06-26 NOTE — ED Notes (Signed)
ED Provider at bedside. 

## 2017-06-26 NOTE — ED Provider Notes (Signed)
[facility] Provider Note   CSN: [id number] Arrival date & time: 06/26/17  1748 By signing my name below, I, [name], attest that this documentation has been prepared under the direction and in the presence of [name], MD . Electronically Signed: [name], Scribe. 06/26/2017. 6:09 PM.   History   Chief Complaint Chief Complaint  Patient presents with  . Back Pain   HPI Rebecca Osborn is a 76 y.o. female who presents to the Emergency Department complaining of left flank pain with radiation to her LLQ onset three days ago. Pain is exacerbated by Palpating the area and by changing positions; no alleviating factors noted. She has taken Motrin, hydrocodone, and Tylenol with no significant relief of pain. She does report that hydrocodone made her sleep No recent fall or injury. Last normal bowel movement was 2 days ago. Pt is a nonsmoker and does not drink. She denies any fever, dysuria, nausea, vomiting, or appetite change. Pt has no other acute complaints or associated symptoms at this time.    PCP: [name], MD   The history is provided by the patient and a relative. No language interpreter was used.   Past Medical History:  Diagnosis Date  . Arthritis   . Asthma   . Constipation   . Hypertension   . Thyroid disease     Patient Active Problem List   Diagnosis Date Noted  . 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 HIP PAIN 06/25/2008  . [place] DEGENERATION 06/25/2008  . BURSITIS, HIP 06/25/2008    Past Surgical History:  Procedure Laterality Date  . APPENDECTOMY    . JOINT REPLACEMENT      OB History    No data available       Home Medications    Prior to Admission medications   Medication Sig Start Date End Date Taking? Authorizing Provider  amLODipine (NORVASC) 10 MG tablet Take 10 mg by mouth daily.   Yes [provider]  diclofenac (CATAFLAM) 50 MG tablet Take 1 tablet (50 mg total) by mouth 2 (two) times  daily. 04/09/16  Yes Carole Civil, MD  levothyroxine (SYNTHROID, LEVOTHROID) 88 MCG tablet Take 88 mcg by mouth daily.   Yes [provider]  traMADol (ULTRAM) 50 MG tablet Take 1 tablet (50 mg total) by mouth every 6 (six) hours as needed for pain. 10/23/12  Yes Schinlever, Barnetta Chapel, PA-C  Bioflavonoid Products (BIOFLEX PO) Take by mouth.    [provider]  esomeprazole (NEXIUM) 40 MG capsule Take 40 mg by mouth daily before breakfast.    [provider]  Multiple Vitamins-Minerals (MULTIVITAMIN WITH MINERALS) tablet Take 1 tablet by mouth daily.    [provider]    Family History Family History  Problem Relation Age of Onset  . Asthma Brother   . Hypertension Brother     Social History Social History  Substance Use Topics  . Smoking status: Never Smoker  . Smokeless tobacco: Never Used  . Alcohol use No     Allergies   Penicillins   Review of Systems Review of Systems  Constitutional: Negative.   HENT: Negative.   Respiratory: Negative.   Cardiovascular: Negative.   Gastrointestinal: Positive for abdominal pain and constipation.  Genitourinary: Positive for flank pain.  Skin: Negative.   Neurological: Negative.   Psychiatric/Behavioral: Negative.   All other systems reviewed and are negative.    Physical Exam Updated Vital Signs BP 131/78 (BP Location: Left Arm)  Pulse 77   Temp 98 F (36.7 C) (Oral)   Resp 19   Ht 5\' 1"  (1.549 m)   Wt 185 lb (83.9 kg)   SpO2 100%   BMI 34.96 kg/m   Physical Exam  Constitutional: She appears well-developed and well-nourished. No distress.  HENT:  Head: Normocephalic and atraumatic.  Eyes: Pupils are equal, round, and reactive to light. Conjunctivae are normal.  Neck: Neck supple. No tracheal deviation present. No thyromegaly present.  Cardiovascular: Normal rate and regular rhythm.   No murmur heard. Pulmonary/Chest: Effort normal and breath sounds normal.  Abdominal:  Soft. Bowel sounds are normal. She exhibits no distension. There is tenderness.  Minimally tender at right lower quadrant. No left lower quadrant tenderness.  Genitourinary:  Genitourinary Comments: Left flank tenderness rectum external exam only. There is a nonthrombosed hemorrhoid. No fissure  Musculoskeletal: Normal range of motion. She exhibits no edema or tenderness.  Neurological: She is alert. Coordination normal.  Skin: Skin is warm and dry. No rash noted.  Psychiatric: She has a normal mood and affect.  Nursing note and vitals reviewed.    ED Treatments / Results  DIAGNOSTIC STUDIES:  Oxygen Saturation is 100% on RA, normal by my interpretation.    COORDINATION OF CARE:  6:08 PM Will order UA, CBC, BMP, and CT abdomen pelvis. Discussed treatment plan which includes morphine with pt at bedside and pt agreed to plan.   Labs (all labs ordered are listed, but only abnormal results are displayed) Labs Reviewed  BASIC METABOLIC PANEL - Abnormal; Notable for the following:       Result Value   Glucose, Bld 135 (*)    BUN 22 (*)    All other components within normal limits  URINALYSIS, ROUTINE W REFLEX MICROSCOPIC - Abnormal; Notable for the following:    Leukocytes, UA SMALL (*)    All other components within normal limits  URINALYSIS, MICROSCOPIC (REFLEX) - Abnormal; Notable for the following:    Bacteria, UA RARE (*)    Squamous Epithelial / LPF 0-5 (*)    All other components within normal limits  CBC WITH DIFFERENTIAL/PLATELET    EKG  EKG Interpretation None       Radiology Ct Abdomen Pelvis W Contrast  Result Date: 06/26/2017 CLINICAL DATA:  Patient with left lower quadrant abdominal pain. EXAM: CT ABDOMEN AND PELVIS WITH CONTRAST TECHNIQUE: Multidetector CT imaging of the abdomen and pelvis was performed using the standard protocol following bolus administration of intravenous contrast. CONTRAST:  152mL ISOVUE-300 IOPAMIDOL (ISOVUE-300) INJECTION 61%  COMPARISON:  None. FINDINGS: Lower chest: Normal heart size. Dependent atelectasis within the bilateral lower lobes. No pleural effusion. Hepatobiliary: Liver is normal in size and contour. Gallbladder is unremarkable. No intrahepatic or extrahepatic biliary ductal dilatation. Pancreas: Unremarkable Spleen: Unremarkable Adrenals/Urinary Tract: The adrenal glands are normal. Kidneys enhance symmetrically with contrast. No hydronephrosis. Urinary bladder is unremarkable. Possible 2 mm stone inferior pole right kidney (image 50; series 5). No hydronephrosis. Stomach/Bowel: Descending and sigmoid colonic diverticulosis. No CT evidence for acute diverticulitis. No abnormal bowel wall thickening or evidence for bowel obstruction. Normal morphology of the stomach. Vascular/Lymphatic: Normal caliber abdominal aorta. Peripheral calcified atherosclerotic plaque. No retroperitoneal lymphadenopathy. Reproductive: Calcified fibroid in the uterus. Other: None. Musculoskeletal: Lumbar spine degenerative changes. No aggressive or acute appearing osseous lesions. IMPRESSION: No acute process within the abdomen or pelvis. Fibroid uterus. Electronically Signed   By: Lovey Newcomer M.D.   On: 06/26/2017 19:20    Procedures Procedures (including  critical care time)  Medications Ordered in ED Medications  morphine 2 MG/ML injection 2 mg (2 mg Intravenous Given 06/26/17 1944)  iopamidol (ISOVUE-300) 61 % injection 100 mL (100 mLs Intravenous Contrast Given 06/26/17 1858)     Initial Impression / Assessment and Plan / ED Course  I have reviewed the triage vital signs and the nursing notes.  Pertinent labs & imaging results that were available during my care of the patient were reviewed by me and considered in my medical decision making (see chart for details).      7:55 PM pain improved after treatment with intravenous morphine. Patient resting comfortably. Pain is likely muscular in etiology. No evidence of infection. Plan  Tylenol for pain. Warm compresses. Senna for constipation follow-up with PMD as needed Final Clinical Impressions(s) / ED Diagnoses  Diagnosis #1 left flank pain #2 constipation #3 external hemorrhoids Final diagnoses:  None    New Prescriptions New Prescriptions   No medications on file    I personally performed the services described in this documentation, which was scribed in my presence. The recorded information has been reviewed and considered.     [name], MD 06/26/17 2005

## 2017-07-15 DIAGNOSIS — K5904 Chronic idiopathic constipation: Secondary | ICD-10-CM | POA: Diagnosis not present

## 2017-07-15 DIAGNOSIS — K625 Hemorrhage of anus and rectum: Secondary | ICD-10-CM | POA: Diagnosis not present

## 2017-07-15 DIAGNOSIS — K6289 Other specified diseases of anus and rectum: Secondary | ICD-10-CM | POA: Diagnosis not present

## 2017-07-15 DIAGNOSIS — K219 Gastro-esophageal reflux disease without esophagitis: Secondary | ICD-10-CM | POA: Diagnosis not present

## 2017-07-15 DIAGNOSIS — E669 Obesity, unspecified: Secondary | ICD-10-CM | POA: Diagnosis not present

## 2017-07-15 DIAGNOSIS — K602 Anal fissure, unspecified: Secondary | ICD-10-CM | POA: Diagnosis not present

## 2017-08-02 DIAGNOSIS — E559 Vitamin D deficiency, unspecified: Secondary | ICD-10-CM | POA: Diagnosis not present

## 2017-08-02 DIAGNOSIS — R739 Hyperglycemia, unspecified: Secondary | ICD-10-CM | POA: Diagnosis not present

## 2017-08-02 DIAGNOSIS — G8929 Other chronic pain: Secondary | ICD-10-CM | POA: Diagnosis not present

## 2017-08-02 DIAGNOSIS — I10 Essential (primary) hypertension: Secondary | ICD-10-CM | POA: Diagnosis not present

## 2017-08-02 DIAGNOSIS — R202 Paresthesia of skin: Secondary | ICD-10-CM | POA: Diagnosis not present

## 2017-08-02 DIAGNOSIS — E7800 Pure hypercholesterolemia, unspecified: Secondary | ICD-10-CM | POA: Diagnosis not present

## 2017-08-02 DIAGNOSIS — E039 Hypothyroidism, unspecified: Secondary | ICD-10-CM | POA: Diagnosis not present

## 2017-08-02 DIAGNOSIS — M25512 Pain in left shoulder: Secondary | ICD-10-CM | POA: Diagnosis not present

## 2017-08-10 DIAGNOSIS — K219 Gastro-esophageal reflux disease without esophagitis: Secondary | ICD-10-CM | POA: Diagnosis not present

## 2017-08-10 DIAGNOSIS — E669 Obesity, unspecified: Secondary | ICD-10-CM | POA: Diagnosis not present

## 2017-08-10 DIAGNOSIS — Z1211 Encounter for screening for malignant neoplasm of colon: Secondary | ICD-10-CM | POA: Diagnosis not present

## 2017-08-10 DIAGNOSIS — K5904 Chronic idiopathic constipation: Secondary | ICD-10-CM | POA: Diagnosis not present

## 2017-08-10 DIAGNOSIS — K602 Anal fissure, unspecified: Secondary | ICD-10-CM | POA: Diagnosis not present

## 2017-08-21 DIAGNOSIS — M25512 Pain in left shoulder: Secondary | ICD-10-CM | POA: Diagnosis not present

## 2017-08-21 DIAGNOSIS — G8929 Other chronic pain: Secondary | ICD-10-CM | POA: Diagnosis not present

## 2017-08-21 DIAGNOSIS — R202 Paresthesia of skin: Secondary | ICD-10-CM | POA: Diagnosis not present

## 2017-08-21 DIAGNOSIS — R05 Cough: Secondary | ICD-10-CM | POA: Diagnosis not present

## 2017-08-21 DIAGNOSIS — K5909 Other constipation: Secondary | ICD-10-CM | POA: Diagnosis not present

## 2017-09-09 DIAGNOSIS — G8929 Other chronic pain: Secondary | ICD-10-CM | POA: Diagnosis not present

## 2017-09-09 DIAGNOSIS — M25512 Pain in left shoulder: Secondary | ICD-10-CM | POA: Diagnosis not present

## 2017-10-22 DIAGNOSIS — Z23 Encounter for immunization: Secondary | ICD-10-CM | POA: Diagnosis not present

## 2017-10-22 DIAGNOSIS — M25512 Pain in left shoulder: Secondary | ICD-10-CM | POA: Diagnosis not present

## 2017-10-22 DIAGNOSIS — G8929 Other chronic pain: Secondary | ICD-10-CM | POA: Diagnosis not present

## 2017-10-22 DIAGNOSIS — M79641 Pain in right hand: Secondary | ICD-10-CM | POA: Diagnosis not present

## 2017-11-26 DIAGNOSIS — D124 Benign neoplasm of descending colon: Secondary | ICD-10-CM | POA: Diagnosis not present

## 2017-11-26 DIAGNOSIS — Z1211 Encounter for screening for malignant neoplasm of colon: Secondary | ICD-10-CM | POA: Diagnosis not present

## 2017-11-26 DIAGNOSIS — K635 Polyp of colon: Secondary | ICD-10-CM | POA: Diagnosis not present

## 2017-11-26 DIAGNOSIS — K210 Gastro-esophageal reflux disease with esophagitis: Secondary | ICD-10-CM | POA: Diagnosis not present

## 2017-11-26 DIAGNOSIS — K2950 Unspecified chronic gastritis without bleeding: Secondary | ICD-10-CM | POA: Diagnosis not present

## 2017-11-26 DIAGNOSIS — K319 Disease of stomach and duodenum, unspecified: Secondary | ICD-10-CM | POA: Diagnosis not present

## 2017-11-26 DIAGNOSIS — K269 Duodenal ulcer, unspecified as acute or chronic, without hemorrhage or perforation: Secondary | ICD-10-CM | POA: Diagnosis not present

## 2017-11-26 DIAGNOSIS — K219 Gastro-esophageal reflux disease without esophagitis: Secondary | ICD-10-CM | POA: Diagnosis not present

## 2017-12-20 ENCOUNTER — Encounter (HOSPITAL_BASED_OUTPATIENT_CLINIC_OR_DEPARTMENT_OTHER): Payer: Self-pay

## 2017-12-20 ENCOUNTER — Other Ambulatory Visit: Payer: Self-pay

## 2017-12-20 ENCOUNTER — Emergency Department (HOSPITAL_BASED_OUTPATIENT_CLINIC_OR_DEPARTMENT_OTHER): Payer: Medicare Other

## 2017-12-20 ENCOUNTER — Emergency Department (HOSPITAL_BASED_OUTPATIENT_CLINIC_OR_DEPARTMENT_OTHER)
Admission: EM | Admit: 2017-12-20 | Discharge: 2017-12-20 | Disposition: A | Discharge status: Home / Self Care | Payer: Medicare Other | Attending: Emergency Medicine | Admitting: Emergency Medicine

## 2017-12-20 DIAGNOSIS — R52 Pain, unspecified: Secondary | ICD-10-CM

## 2017-12-20 DIAGNOSIS — J45909 Unspecified asthma, uncomplicated: Secondary | ICD-10-CM | POA: Insufficient documentation

## 2017-12-20 DIAGNOSIS — I10 Essential (primary) hypertension: Secondary | ICD-10-CM | POA: Insufficient documentation

## 2017-12-20 DIAGNOSIS — M545 Low back pain, unspecified: Secondary | ICD-10-CM

## 2017-12-20 DIAGNOSIS — Z79899 Other long term (current) drug therapy: Secondary | ICD-10-CM | POA: Insufficient documentation

## 2017-12-20 LAB — URINALYSIS, ROUTINE W REFLEX MICROSCOPIC
Bilirubin Urine: NEGATIVE
GLUCOSE, UA: NEGATIVE mg/dL
Ketones, ur: NEGATIVE mg/dL
Nitrite: NEGATIVE
Protein, ur: NEGATIVE mg/dL
Specific Gravity, Urine: 1.025 (ref 1.005–1.030)
pH: 6 (ref 5.0–8.0)

## 2017-12-20 LAB — URINALYSIS, MICROSCOPIC (REFLEX)

## 2017-12-20 MED ORDER — CYCLOBENZAPRINE HCL 10 MG PO TABS: 10.0000 mg | ORAL_TABLET | Freq: Two times a day (BID) | ORAL | 0 refills | Status: DC | PRN

## 2017-12-20 MED ORDER — HYDROCODONE-ACETAMINOPHEN 5-325 MG PO TABS
1.0000 | ORAL_TABLET | Freq: Once | ORAL | Status: AC
  Administered 2017-12-20: 1 via ORAL
  Filled 2017-12-20: qty 1

## 2017-12-20 MED ORDER — HYDROCODONE-ACETAMINOPHEN 5-325 MG PO TABS: 1.0000 | ORAL_TABLET | Freq: Four times a day (QID) | ORAL | 0 refills | Status: DC | PRN

## 2017-12-20 NOTE — ED Triage Notes (Signed)
C/o lower back pain, left flank pain x 2-3 weeks-denies injury-presents to triage in w/c-NAD

## 2017-12-20 NOTE — ED Provider Notes (Signed)
[facility] EMERGENCY DEPARTMENT Provider Note   CSN: [id number] Arrival date & time: 12/20/17  1907     History   Chief Complaint Chief Complaint  Patient presents with  . Back Pain    HPI Rebecca Osborn is a 77 y.o. female.  77yo F w/ PMH including PUD, osteoporosis, HTN, lumbar spine surgery who p/w back pain.  Family reports that she has been complaining of lower back pain off and on for the past 2-3 weeks, worse on the left side.  Pain became more severe today when they noted that she was hunched over having a difficult time walking. Back pain is worse with any movement, worse on the left side. She feels burning when laying down. She denies any radiation of pain down her leg, leg weakness, numbness, or bowel/bladder problems. No urinary symptoms, fevers, vomiting, abdominal pain, or recent illness. No trauma, falls, or change in physical activity.   She had back surgery ~20 years ago. Son is concerned because she has osteoporosis and could have fracture.      Past Medical History:  Diagnosis Date  . Arthritis   . Asthma   . Constipation   . Hypertension   . Thyroid disease     Patient Active Problem List   Diagnosis Date Noted  . 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 HIP PAIN 06/25/2008  . [place] DEGENERATION 06/25/2008  . BURSITIS, HIP 06/25/2008    Past Surgical History:  Procedure Laterality Date  . APPENDECTOMY    . BACK SURGERY    . JOINT REPLACEMENT    . KNEE ARTHROSCOPY      OB History    No data available       Home Medications    Prior to Admission medications   Medication Sig Start Date End Date Taking? Authorizing Provider  TRAMADOL HCL ER PO Take by mouth.   Yes [provider]  amLODipine (NORVASC) 10 MG tablet Take 10 mg by mouth daily.    [provider]  Bioflavonoid Products (BIOFLEX PO) Take by mouth.    [provider]  cyclobenzaprine (FLEXERIL) 10 MG tablet Take 1 tablet (10 mg  total) by mouth 2 (two) times daily as needed for muscle spasms. 12/20/17   Little, Wenda Overland, MD  esomeprazole (NEXIUM) 40 MG capsule Take 40 mg by mouth daily before breakfast.    [provider]  HYDROcodone-acetaminophen (NORCO/VICODIN) 5-325 MG tablet Take 1 tablet by mouth every 6 (six) hours as needed for severe pain. 12/20/17   Little, Wenda Overland, MD  levothyroxine (SYNTHROID, LEVOTHROID) 88 MCG tablet Take 88 mcg by mouth daily.    [provider]  Multiple Vitamins-Minerals (MULTIVITAMIN WITH MINERALS) tablet Take 1 tablet by mouth daily.    [provider]    Family History Family History  Problem Relation Age of Onset  . Asthma Brother   . Hypertension Brother     Social History Social History   Tobacco Use  . Smoking status: Never Smoker  . Smokeless tobacco: Never Used  Substance Use Topics  . Alcohol use: No  . Drug use: No     Allergies   Penicillins   Review of Systems Review of Systems All other systems reviewed and are negative except that which was mentioned in HPI   Physical Exam Updated Vital Signs BP 128/71 (BP Location: Right Arm)   Pulse 77   Temp 98 F (36.7 C) (Oral)   Resp 18   Ht  5' (1.524 m)   Wt 83.9 kg (185 lb)   SpO2 99%   BMI 36.13 kg/m   Physical Exam  Constitutional: She is oriented to person, place, and time. She appears well-developed and well-nourished. No distress.  HENT:  Head: Normocephalic and atraumatic.  Moist mucous membranes  Eyes: Conjunctivae are normal.  Neck: Neck supple.  Cardiovascular: Normal rate, regular rhythm and normal heart sounds.  No murmur heard. Pulmonary/Chest: Effort normal and breath sounds normal.  Abdominal: Soft. Bowel sounds are normal. She exhibits no distension. There is no tenderness.  Musculoskeletal: She exhibits no edema.  Mild pain with palpation of L lumbar paraspinal muscles w/ no midline tenderness or step-off, no skin changes  Neurological: She  is alert and oriented to person, place, and time. She displays normal reflexes. No sensory deficit.  Fluent speech 5/5 strength BLE  Skin: Skin is warm and dry.  Psychiatric: She has a normal mood and affect. Judgment normal.  Nursing note and vitals reviewed.    ED Treatments / Results  Labs (all labs ordered are listed, but only abnormal results are displayed) Labs Reviewed  URINALYSIS, ROUTINE W REFLEX MICROSCOPIC - Abnormal; Notable for the following components:      Result Value   APPearance HAZY (*)    Hgb urine dipstick SMALL (*)    Leukocytes, UA SMALL (*)    All other components within normal limits  URINALYSIS, MICROSCOPIC (REFLEX) - Abnormal; Notable for the following components:   Bacteria, UA MANY (*)    Squamous Epithelial / LPF 0-5 (*)    All other components within normal limits  URINE CULTURE    EKG  EKG Interpretation None       Radiology Dg Thoracic Spine W/swimmers  Result Date: 12/20/2017 CLINICAL DATA:  Worsening back pain EXAM: THORACIC SPINE - 3 VIEWS COMPARISON:  05/15/2016 FINDINGS: Thoracic alignment is within normal limits. The vertebral body heights are within normal limits. Disc spaces are maintained. Minimal anterior osteophytes. IMPRESSION: Negative. Electronically Signed   By: Donavan Foil M.D.   On: 12/20/2017 21:42   Dg Lumbar Spine 2-3 Views  Result Date: 12/20/2017 CLINICAL DATA:  Worsening back pain EXAM: LUMBAR SPINE - 2-3 VIEW COMPARISON:  CT 06/26/2017, radiographs 09/24/2016 FINDINGS: Mild dextroscoliosis of the spine. Interbody device at L4-L5, similar appearance compared to prior radiograph. Fusion at L4-L5. Mild degenerative changes at L2-L3 and L3-L4 and L5-S1. The vertebral body heights are normal. Coarse calcification in the pelvis likely a fibroid IMPRESSION: 1. Postoperative changes at L4-L5 2. No acute osseous abnormality 3. Mild degenerative changes of the lumbar spine Electronically Signed   By: Donavan Foil M.D.   On:  12/20/2017 21:44    Procedures Procedures (including critical care time)  Medications Ordered in ED Medications  HYDROcodone-acetaminophen (NORCO/VICODIN) 5-325 MG per tablet 1 tablet (1 tablet Oral Given 12/20/17 2145)     Initial Impression / Assessment and Plan / ED Course  I have reviewed the triage vital signs and the nursing notes.  Pertinent labs & imaging results that were available during my care of the patient were reviewed by me and considered in my medical decision making (see chart for details).     Neurologically intact on exam. Denies any urinary symptoms, small amount of leukocytes and some WBC but with pt being asymptomatic and pain worse w/ certain movements, I doubt pyelonephritis as cause of sx. Ordered urine culture.   XR thoracic and lumbar spine show no changes in hardware and no  acute fractures.  Patient comfortable on reassessment.  Provided with a small amount of Norco as she cannot take NSAIDS due to PUD. Has tolerated flexeril previously so provided to use prn.  She has already been referred to neurosurgeon spine specialist for neck problems and instructed her to follow-up with them regarding the back pain as well.   Patient demonstrates no lower extremity weakness, saddle anesthesia, bowel or bladder incontinence, or any other neurologic deficits concerning for cauda equina. No fevers or other infectious symptoms to suggest by the patient's back pain is due to an infection. I have reviewed return precautions, including the development of any of these signs or symptoms, and the patient has voiced understanding.  Patient and family voiced understanding and was discharged in satisfactory condition.   Final Clinical Impressions(s) / ED Diagnoses   Final diagnoses:  Acute left-sided low back pain without sciatica    ED Discharge Orders        Ordered    cyclobenzaprine (FLEXERIL) 10 MG tablet  2 times daily PRN     12/20/17 2304    HYDROcodone-acetaminophen  (NORCO/VICODIN) 5-325 MG tablet  Every 6 hours PRN     12/20/17 2304       Little, Wenda Overland, MD 12/20/17 2344

## 2017-12-22 LAB — URINE CULTURE: SPECIAL REQUESTS: NORMAL

## 2018-12-14 ENCOUNTER — Emergency Department (HOSPITAL_BASED_OUTPATIENT_CLINIC_OR_DEPARTMENT_OTHER)
Admission: EM | Admit: 2018-12-14 | Discharge: 2018-12-15 | Disposition: A | Discharge status: Home / Self Care | Payer: Medicare Other | Attending: Emergency Medicine | Admitting: Emergency Medicine

## 2018-12-14 ENCOUNTER — Emergency Department (HOSPITAL_BASED_OUTPATIENT_CLINIC_OR_DEPARTMENT_OTHER): Payer: Medicare Other

## 2018-12-14 ENCOUNTER — Other Ambulatory Visit: Payer: Self-pay

## 2018-12-14 ENCOUNTER — Encounter (HOSPITAL_BASED_OUTPATIENT_CLINIC_OR_DEPARTMENT_OTHER): Payer: Self-pay | Admitting: Emergency Medicine

## 2018-12-14 DIAGNOSIS — J45909 Unspecified asthma, uncomplicated: Secondary | ICD-10-CM | POA: Diagnosis not present

## 2018-12-14 DIAGNOSIS — I10 Essential (primary) hypertension: Secondary | ICD-10-CM | POA: Diagnosis not present

## 2018-12-14 DIAGNOSIS — R079 Chest pain, unspecified: Secondary | ICD-10-CM | POA: Diagnosis present

## 2018-12-14 DIAGNOSIS — R0789 Other chest pain: Secondary | ICD-10-CM | POA: Diagnosis not present

## 2018-12-14 DIAGNOSIS — Z79899 Other long term (current) drug therapy: Secondary | ICD-10-CM | POA: Diagnosis not present

## 2018-12-14 LAB — CBC
HEMATOCRIT: 41 % (ref 36.0–46.0)
Hemoglobin: 12.8 g/dL (ref 12.0–15.0)
MCH: 28.3 pg (ref 26.0–34.0)
MCHC: 31.2 g/dL (ref 30.0–36.0)
MCV: 90.7 fL (ref 80.0–100.0)
Platelets: 326 10*3/uL (ref 150–400)
RBC: 4.52 MIL/uL (ref 3.87–5.11)
RDW: 13.5 % (ref 11.5–15.5)
WBC: 8 10*3/uL (ref 4.0–10.5)
nRBC: 0 % (ref 0.0–0.2)

## 2018-12-14 LAB — BASIC METABOLIC PANEL
Anion gap: 6 (ref 5–15)
BUN: 20 mg/dL (ref 8–23)
CO2: 24 mmol/L (ref 22–32)
CREATININE: 0.68 mg/dL (ref 0.44–1.00)
Calcium: 9.5 mg/dL (ref 8.9–10.3)
Chloride: 106 mmol/L (ref 98–111)
GFR calc Af Amer: >60 mL/min (ref >60)
GLUCOSE: 99 mg/dL (ref 70–99)
Potassium: 3.4 mmol/L — ABNORMAL LOW (ref 3.5–5.1)
Sodium: 136 mmol/L (ref 135–145)

## 2018-12-14 LAB — TROPONIN I

## 2018-12-14 MED ORDER — ALUM & MAG HYDROXIDE-SIMETH 200-200-20 MG/5ML PO SUSP
30.0000 mL | Freq: Once | ORAL | Status: AC
  Administered 2018-12-14: 30 mL via ORAL

## 2018-12-14 MED ORDER — SODIUM CHLORIDE 0.9% FLUSH
3.0000 mL | Freq: Once | INTRAVENOUS | Status: DC
  Filled 2018-12-14: qty 3

## 2018-12-14 MED ORDER — ALUM & MAG HYDROXIDE-SIMETH 200-200-20 MG/5ML PO SUSP
ORAL | Status: AC
  Filled 2018-12-14: qty 30

## 2018-12-14 MED ORDER — POTASSIUM CHLORIDE CRYS ER 20 MEQ PO TBCR
40.0000 meq | EXTENDED_RELEASE_TABLET | Freq: Once | ORAL | Status: AC
  Administered 2018-12-14: 40 meq via ORAL
  Filled 2018-12-14: qty 2

## 2018-12-14 NOTE — ED Provider Notes (Signed)
[facility] Provider Note   CSN: [id number] Arrival date & time: 12/14/18  1908   History   Chief Complaint Chief Complaint  Patient presents with  . Chest Pain    central     HPI Rebecca Osborn is a 78 y.o. female.  Patient reports that she was watching TV earlier when she had a stabbing pain in the center of her chest.  Patient also felt an associated heaviness in her left arm.  Patient reports she had some associated nausea but no vomiting.  Reports that she is never had anything like this happen before.  Patient has a history of hypertension and is only on amlodipine.  Does report that she was also just eaten prior to this happening.  Denies any shortness of breath, leg swelling.  Patient reports that she has had a dry cough and her son has been sick with cold.  The history is provided by the patient and a relative.    Past Medical History:  Diagnosis Date  . Arthritis   . Asthma   . Constipation   . Hypertension   . Thyroid disease     Patient Active Problem List   Diagnosis Date Noted  . 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 HIP PAIN 06/25/2008  . [place] DEGENERATION 06/25/2008  . BURSITIS, HIP 06/25/2008    Past Surgical History:  Procedure Laterality Date  . APPENDECTOMY    . BACK SURGERY    . JOINT REPLACEMENT    . KNEE ARTHROSCOPY       OB History   No obstetric history on file.      Home Medications    Prior to Admission medications   Medication Sig Start Date End Date Taking? Authorizing Provider  amLODipine (NORVASC) 10 MG tablet Take 10 mg by mouth daily.    [provider]  Bioflavonoid Products (BIOFLEX PO) Take by mouth.    [provider]  cyclobenzaprine (FLEXERIL) 10 MG tablet Take 1 tablet (10 mg total) by mouth 2 (two) times daily as needed for muscle spasms. 12/20/17   Little, Wenda Overland, MD  esomeprazole (NEXIUM) 40 MG capsule Take 40 mg by mouth daily before breakfast.     [provider]  HYDROcodone-acetaminophen (NORCO/VICODIN) 5-325 MG tablet Take 1 tablet by mouth every 6 (six) hours as needed for severe pain. 12/20/17   Little, Wenda Overland, MD  levothyroxine (SYNTHROID, LEVOTHROID) 88 MCG tablet Take 88 mcg by mouth daily.    [provider]  Multiple Vitamins-Minerals (MULTIVITAMIN WITH MINERALS) tablet Take 1 tablet by mouth daily.    [provider]  TRAMADOL HCL ER PO Take by mouth.    [provider]    Family History Family History  Problem Relation Age of Onset  . Asthma Brother   . Hypertension Brother     Social History Social History   Tobacco Use  . Smoking status: Never Smoker  . Smokeless tobacco: Never Used  Substance Use Topics  . Alcohol use: No  . Drug use: No     Allergies   Penicillins   Review of Systems Review of Systems  Constitutional: Negative for chills and fever.  HENT: Positive for rhinorrhea. Negative for congestion.   Respiratory: Positive for cough and chest tightness. Negative for shortness of breath.   Cardiovascular: Positive for chest pain. Negative for palpitations and leg swelling.  Gastrointestinal: Positive for nausea. Negative for abdominal pain, constipation, diarrhea and vomiting.  Genitourinary: Negative  for dysuria.  Neurological: Negative for dizziness, weakness, numbness and headaches.  All other systems reviewed and are negative.    Physical Exam Updated Vital Signs BP (!) 152/82   Pulse 72   Temp 98.2 F (36.8 C) (Oral)   Resp 17   Ht 5\' 2"  (1.575 m)   Wt 83 kg   SpO2 99%   BMI 33.47 kg/m   Physical Exam Vitals signs and nursing note reviewed.  Constitutional:      Appearance: She is well-developed and normal weight.  HENT:     Head: Normocephalic and atraumatic.  Eyes:     Extraocular Movements: Extraocular movements intact.     Pupils: Pupils are equal, round, and reactive to light.  Neck:     Musculoskeletal: Normal range of  motion and neck supple.  Cardiovascular:     Rate and Rhythm: Normal rate and regular rhythm.     Heart sounds: Normal heart sounds.  Pulmonary:     Effort: Pulmonary effort is normal.     Breath sounds: Normal breath sounds. No decreased breath sounds or wheezing.  Chest:     Chest wall: No tenderness.  Abdominal:     General: Bowel sounds are normal.     Palpations: Abdomen is soft.  Skin:    General: Skin is warm.  Neurological:     General: No focal deficit present.     Mental Status: She is alert.  Psychiatric:        Mood and Affect: Mood normal.        Behavior: Behavior normal.     ED Treatments / Results  Labs (all labs ordered are listed, but only abnormal results are displayed) Labs Reviewed  BASIC METABOLIC PANEL - Abnormal; Notable for the following components:      Result Value   Potassium 3.4 (*)    All other components within normal limits  CBC  TROPONIN I  TROPONIN I    EKG EKG Interpretation  Date/Time:  Wednesday December 14 2018 19:14:50 EST Ventricular Rate:  77 PR Interval:  192 QRS Duration: 86 QT Interval:  396 QTC Calculation: 448 R Axis:   47 Text Interpretation:  Normal sinus rhythm Cannot rule out Anterior infarct , age undetermined Abnormal ECG No significant change was found Confirmed by Jola Schmidt [phone]) on 12/14/2018 7:28:32 PM   Radiology Dg Chest 2 View  Result Date: 12/14/2018 CLINICAL DATA:  Left chest pain radiating to the shoulders since this afternoon. EXAM: CHEST - 2 VIEW COMPARISON:  05/15/2016 FINDINGS: Normal heart size and pulmonary vascularity. No focal airspace disease or consolidation in the lungs. No blunting of costophrenic angles. No pneumothorax. Mediastinal contours appear intact. IMPRESSION: No active cardiopulmonary disease. Electronically Signed   By: Lucienne Capers M.D.   On: 12/14/2018 20:15    Procedures Procedures (including critical care time)  Medications Ordered in ED Medications  sodium  chloride flush (NS) 0.9 % injection 3 mL (has no administration in time range)  potassium chloride SA (K-DUR,KLOR-CON) CR tablet 40 mEq (40 mEq Oral Given 12/14/18 2341)     Initial Impression / Assessment and Plan / ED Course  I have reviewed the triage vital signs and the nursing notes.  Pertinent labs & imaging results that were available during my care of the patient were reviewed by me and considered in my medical decision making (see chart for details).   78 year old female here with sudden onset of sharp chest pain with associated left arm heaviness.  EKG on arrival to ED is unchanged from previous.  Will obtain troponin, CBC, BMP and obtain chest x-ray given history of cough.  Initial troponin negative, CBC WNL and BMP only significant for potassium of 3.4.  Will give 17meq Kdur. Chest x-ray with no active disease.  We will continue to monitor patient on telemetry and will perform repeat troponin and repeat EKG to ensure that it remains negative.  Repeat EKG remains unchanged compared to prior studies.  Repeat troponin negative.  Atypical chest pain could be related to GERD given that patient experienced symptoms after eating.  Patient encouraged to follow-up with her regular doctor as well as cardiologist.  Given strict return precautions.  [place] Shiela Bruns, DO PGY-2, [facility]   Final Clinical Impressions(s) / ED Diagnoses   Final diagnoses:  Atypical chest pain    ED Discharge Orders    None       Taleeyah Bora, [place], DO 12/14/18 [place] Kevin, MD 12/15/18 [phone]

## 2018-12-14 NOTE — Discharge Instructions (Addendum)
It is important that you follow-up with your regular doctor and your cardiologist.  You will likely need an ultrasound of your heart.  The work-up that we did in the emergency room here today shows that you are not having signs of a heart attack.  Certainly if you develop any worsening chest pain, shortness of breath or trouble breathing then please do not hesitate to return to the emergency room.

## 2018-12-14 NOTE — ED Notes (Signed)
Pt c/o central chest pain rated 6/10 that radiates to left arm that started this evening after eating. Pt states she was sitting in a chair when the pain began Pt denies nausea or vomiting. Pt denies sob or dizziness.

## 2018-12-14 NOTE — ED Triage Notes (Signed)
Pt reports sharp central chest pain started this afternoon , got worse an hour ago  After eating . Hx HTN.  pain radiating to left shoulder , also reports nausea, no emesis. Alert and oriented x 4.

## 2019-06-15 ENCOUNTER — Other Ambulatory Visit: Payer: Self-pay

## 2019-06-15 ENCOUNTER — Emergency Department (HOSPITAL_BASED_OUTPATIENT_CLINIC_OR_DEPARTMENT_OTHER): Payer: Medicare Other

## 2019-06-15 ENCOUNTER — Emergency Department (HOSPITAL_BASED_OUTPATIENT_CLINIC_OR_DEPARTMENT_OTHER)
Admission: EM | Admit: 2019-06-15 | Discharge: 2019-06-16 | Disposition: A | Discharge status: Home / Self Care | Payer: Medicare Other | Attending: Emergency Medicine | Admitting: Emergency Medicine

## 2019-06-15 ENCOUNTER — Encounter (HOSPITAL_BASED_OUTPATIENT_CLINIC_OR_DEPARTMENT_OTHER): Payer: Self-pay

## 2019-06-15 DIAGNOSIS — E079 Disorder of thyroid, unspecified: Secondary | ICD-10-CM | POA: Diagnosis not present

## 2019-06-15 DIAGNOSIS — R1011 Right upper quadrant pain: Secondary | ICD-10-CM | POA: Insufficient documentation

## 2019-06-15 DIAGNOSIS — Z79899 Other long term (current) drug therapy: Secondary | ICD-10-CM | POA: Insufficient documentation

## 2019-06-15 DIAGNOSIS — I10 Essential (primary) hypertension: Secondary | ICD-10-CM | POA: Diagnosis not present

## 2019-06-15 DIAGNOSIS — J45909 Unspecified asthma, uncomplicated: Secondary | ICD-10-CM | POA: Diagnosis not present

## 2019-06-15 DIAGNOSIS — R109 Unspecified abdominal pain: Secondary | ICD-10-CM

## 2019-06-15 DIAGNOSIS — E876 Hypokalemia: Secondary | ICD-10-CM | POA: Insufficient documentation

## 2019-06-15 LAB — URINALYSIS, ROUTINE W REFLEX MICROSCOPIC
Bilirubin Urine: NEGATIVE
Glucose, UA: NEGATIVE mg/dL
Ketones, ur: NEGATIVE mg/dL
Nitrite: NEGATIVE
Protein, ur: NEGATIVE mg/dL
Specific Gravity, Urine: 1.01 (ref 1.005–1.030)
pH: 7 (ref 5.0–8.0)

## 2019-06-15 LAB — LIPASE, BLOOD: Lipase: 22 U/L (ref 11–51)

## 2019-06-15 LAB — COMPREHENSIVE METABOLIC PANEL
ALT: 22 U/L (ref 0–44)
AST: 17 U/L (ref 15–41)
Albumin: 4 g/dL (ref 3.5–5.0)
Alkaline Phosphatase: 68 U/L (ref 38–126)
Anion gap: 11 (ref 5–15)
BUN: 15 mg/dL (ref 8–23)
CO2: 23 mmol/L (ref 22–32)
Calcium: 9.6 mg/dL (ref 8.9–10.3)
Chloride: 106 mmol/L (ref 98–111)
Creatinine, Ser: 0.62 mg/dL (ref 0.44–1.00)
GFR calc Af Amer: >60 mL/min (ref >60)
GFR calc non Af Amer: >60 mL/min (ref >60)
Glucose, Bld: 105 mg/dL — ABNORMAL HIGH (ref 70–99)
Potassium: 3.3 mmol/L — ABNORMAL LOW (ref 3.5–5.1)
Sodium: 140 mmol/L (ref 135–145)
Total Bilirubin: 0.4 mg/dL (ref 0.3–1.2)
Total Protein: 7.1 g/dL (ref 6.5–8.1)

## 2019-06-15 LAB — CBC WITH DIFFERENTIAL/PLATELET
Abs Immature Granulocytes: 0.02 10*3/uL (ref 0.00–0.07)
Basophils Absolute: 0 10*3/uL (ref 0.0–0.1)
Basophils Relative: 0 %
Eosinophils Absolute: 0.1 10*3/uL (ref 0.0–0.5)
Eosinophils Relative: 1 %
HCT: 40.7 % (ref 36.0–46.0)
Hemoglobin: 13.1 g/dL (ref 12.0–15.0)
Immature Granulocytes: 0 %
Lymphocytes Relative: 28 %
Lymphs Abs: 2.2 10*3/uL (ref 0.7–4.0)
MCH: 29.9 pg (ref 26.0–34.0)
MCHC: 32.2 g/dL (ref 30.0–36.0)
MCV: 92.9 fL (ref 80.0–100.0)
Monocytes Absolute: 0.6 10*3/uL (ref 0.1–1.0)
Monocytes Relative: 8 %
Neutro Abs: 5.1 10*3/uL (ref 1.7–7.7)
Neutrophils Relative %: 63 %
Platelets: 375 10*3/uL (ref 150–400)
RBC: 4.38 MIL/uL (ref 3.87–5.11)
RDW: 15.4 % (ref 11.5–15.5)
WBC: 8 10*3/uL (ref 4.0–10.5)
nRBC: 0 % (ref 0.0–0.2)

## 2019-06-15 LAB — TROPONIN I (HIGH SENSITIVITY): Troponin I (High Sensitivity): 2 ng/L (ref <18)

## 2019-06-15 LAB — URINALYSIS, MICROSCOPIC (REFLEX): Bacteria, UA: NONE SEEN

## 2019-06-15 MED ORDER — IOHEXOL 300 MG/ML  SOLN
100.0000 mL | Freq: Once | INTRAMUSCULAR | Status: AC | PRN
  Administered 2019-06-15: 23:00:00 100 mL via INTRAVENOUS

## 2019-06-15 MED ORDER — SODIUM CHLORIDE 0.9 % IV BOLUS
1000.0000 mL | Freq: Once | INTRAVENOUS | Status: AC
  Administered 2019-06-15: 23:00:00 1000 mL via INTRAVENOUS

## 2019-06-15 MED ORDER — MORPHINE SULFATE (PF) 4 MG/ML IV SOLN
4.0000 mg | Freq: Once | INTRAVENOUS | Status: AC
  Administered 2019-06-15: 4 mg via INTRAVENOUS
  Filled 2019-06-15: qty 1

## 2019-06-15 MED ORDER — ONDANSETRON HCL 4 MG/2ML IJ SOLN
4.0000 mg | Freq: Once | INTRAMUSCULAR | Status: AC
  Administered 2019-06-15: 23:00:00 4 mg via INTRAVENOUS
  Filled 2019-06-15: qty 2

## 2019-06-15 NOTE — ED Notes (Signed)
Patient transported to CT 

## 2019-06-15 NOTE — ED Provider Notes (Signed)
[facility] EMERGENCY DEPARTMENT Provider Note   CSN: [id number] Arrival date & time: 06/15/19  1941    History   Chief Complaint Chief Complaint  Patient presents with   Abdominal Pain    HPI Rebecca Osborn is a 78 y.o. female.     HPI   1 week ago began having epigastric abdominal pain Called GI physician and took that medicine for the last week but it is not helping Called her today and she described the symptoms again Vomited yesterday after breakfast At 5PM had some yogurt but having continuing nausea Middle of abdomen and radiating to the right side.  Will radiate downwards.  Pain is constant but waxing and waning.  Was 10/10 now 5/10, today is more severe. 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      Home Medications    Prior to Admission medications   Medication Sig Start Date End Date Taking? Authorizing Provider  amLODipine (NORVASC) 10 MG tablet Take 10 mg by mouth daily.    [provider]  Bioflavonoid Products (BIOFLEX PO) Take by mouth.    [provider]  cyclobenzaprine (FLEXERIL) 10 MG tablet Take 1 tablet (10 mg total) by mouth 2 (two) times daily as needed for muscle spasms. 12/20/17   Little, Wenda Overland, MD  esomeprazole (NEXIUM) 40 MG capsule Take 40 mg by mouth daily  before breakfast.    [provider]  HYDROcodone-acetaminophen (NORCO/VICODIN) 5-325 MG tablet Take 1 tablet by mouth every 6 (six) hours as needed for severe pain. 12/20/17   Little, Wenda Overland, MD  levothyroxine (SYNTHROID, LEVOTHROID) 88 MCG tablet Take 88 mcg by mouth daily.    [provider]  Multiple Vitamins-Minerals (MULTIVITAMIN WITH MINERALS) tablet Take 1 tablet by mouth daily.    [provider]  TRAMADOL HCL ER PO Take by mouth.    [provider]    Family History Family History  Problem Relation Age of Onset   Asthma Brother    Hypertension Brother     Social History Social History   Tobacco Use   Smoking status: Never Smoker   Smokeless tobacco: Never Used  Substance Use Topics   Alcohol use: No   Drug use: No     Allergies   Penicillins   Review of Systems Review of Systems  Constitutional: Negative for fever (99).  HENT: Negative for sore throat.   Eyes: Negative for visual disturbance.  Respiratory: Negative for cough and shortness of breath.   Cardiovascular: Negative for chest pain.  Gastrointestinal: Positive for abdominal distention, abdominal pain, constipation (chronic), nausea and vomiting.  Genitourinary: Positive for frequency (chronic). Negative for difficulty urinating.  Musculoskeletal: Positive for arthralgias.  Negative for back pain and neck pain.  Skin: Negative for rash.  Neurological: Negative for syncope and headaches.     Physical Exam Updated Vital Signs BP (!) 149/87    Pulse (!) 101    Temp 98.6 F (37 C) (Oral)    Resp (!) 24    Ht 5' (1.524 m)    Wt 82.1 kg    SpO2 94%    BMI 35.35 kg/m   Physical Exam Vitals signs and nursing note reviewed.  Constitutional:      General: She is not in acute distress.    Appearance: She is well-developed. She is not diaphoretic.  HENT:     Head: Normocephalic and atraumatic.  Eyes:     Conjunctiva/sclera: Conjunctivae normal.  Neck:      Musculoskeletal: Normal range of motion.  Cardiovascular:     Rate and Rhythm: Normal rate and regular rhythm.     Heart sounds: Normal heart sounds. No murmur. No friction rub. No gallop.   Pulmonary:     Effort: Pulmonary effort is normal. No respiratory distress.     Breath sounds: Normal breath sounds. No wheezing or rales.  Abdominal:     General: There is no distension.     Palpations: Abdomen is soft.     Tenderness: There is abdominal tenderness in the right upper quadrant, right lower quadrant, epigastric area and left lower quadrant. There is no guarding. Negative signs include Murphy's sign and McBurney's sign.  Musculoskeletal:        General: No tenderness.  Skin:    General: Skin is warm and dry.     Findings: No erythema or rash.  Neurological:     Mental Status: She is alert and oriented to person, place, and time.      ED Treatments / Results  Labs (all labs ordered are listed, but only abnormal results are displayed) Labs Reviewed  URINALYSIS, ROUTINE W REFLEX MICROSCOPIC - Abnormal; Notable for the following components:      Result Value   Color, Urine STRAW (*)    Hgb urine dipstick TRACE (*)    Leukocytes,Ua TRACE (*)    All other components within normal limits  COMPREHENSIVE METABOLIC PANEL - Abnormal; Notable for the following components:   Potassium 3.3 (*)    Glucose, Bld 105 (*)    All other components within normal limits  CBC WITH DIFFERENTIAL/PLATELET  LIPASE, BLOOD  URINALYSIS, MICROSCOPIC (REFLEX)  TROPONIN I (HIGH SENSITIVITY)    EKG EKG Interpretation  Date/Time:  Thursday Rebecca 16 2020 22:57:02 EDT Ventricular Rate:  102 PR Interval:    QRS Duration: 92 QT Interval:  350 QTC Calculation: 456 R Axis:   45 Text Interpretation:  Sinus tachycardia Baseline wander in lead(s) V2 Otherwise within normal limits When compared with ECG of 12/14/2018, No significant change was found Confirmed by Delora Fuel ([id number]) on 06/16/2019 12:47:52  AM   Radiology Ct Abdomen Pelvis W Contrast  Result Date: 06/15/2019 CLINICAL DATA:  Bowel obstruction, low-grade abdominal pain, evaluate for SBO, pancreatitis or cholecystitis. EXAM: CT ABDOMEN AND PELVIS WITH CONTRAST TECHNIQUE: Multidetector CT imaging of the abdomen and pelvis was performed using the standard protocol following bolus administration of intravenous contrast. CONTRAST:  180mL OMNIPAQUE IOHEXOL 300 MG/ML  SOLN COMPARISON:  CT abdomen pelvis 06/18/2017, renal ultrasound 06/12/2019 FINDINGS: Lower chest: Lung bases are clear. Normal heart size. No pericardial effusion. Aortic leaflet calcifications. Hepatobiliary: No focal liver abnormality is seen. No gallstones, gallbladder wall thickening, or  biliary dilatation. Pancreas: Unremarkable. No pancreatic ductal dilatation or surrounding inflammatory changes. Spleen: Normal in size without focal abnormality. Adrenals/Urinary Tract: Adrenal glands are unremarkable. Kidneys are normal, without renal calculi, focal lesion, or hydronephrosis. Bladder is unremarkable. Stomach/Bowel: Distal esophagus, stomach and duodenal sweep are unremarkable. No bowel wall thickening or dilatation. No evidence of obstruction. The appendix is surgically absent. Moderate volume stool through the colon. Vascular/Lymphatic: Atherosclerotic plaque within the normal caliber aorta. No suspicious or enlarged lymph nodes in the included lymphatic chains. Reproductive: Retroflexed uterus with a densely calcified fundal subserosal fibroid. No concerning adnexal lesions Other: No abdominopelvic free fluid or free gas. No bowel containing hernias. Small fat containing umbilical hernia Musculoskeletal: Mild dextrocurvature of the thoracolumbar spine insert DJD spine No acute osseous abnormality or suspicious osseous lesion. L4-5 interbody spacer placement with L5 posterior decompression. IMPRESSION: No acute intra-abdominal process. Specifically, no bowel obstruction or CT  evidence of pancreatitis or cholecystitis. Retroflexed fibroid uterus. Aortic Atherosclerosis (ICD10-I70.0). Electronically Signed   By: Lovena Le M.D.   On: 06/15/2019 23:56    Procedures Procedures (including critical care time)  Medications Ordered in ED Medications  ondansetron (ZOFRAN) injection 4 mg (4 mg Intravenous Given 06/15/19 2244)  morphine 4 MG/ML injection 4 mg (4 mg Intravenous Given 06/15/19 2244)  sodium chloride 0.9 % bolus 1,000 mL ( Intravenous Stopped 06/15/19 2327)  iohexol (OMNIPAQUE) 300 MG/ML solution 100 mL (100 mLs Intravenous Contrast Given 06/15/19 2329)  potassium chloride SA (K-DUR) CR tablet 40 mEq (40 mEq Oral Given 06/16/19 0031)     Initial Impression / Assessment and Plan / ED Course  I have reviewed the triage vital signs and the nursing notes.  Pertinent labs & imaging results that were available during my care of the patient were reviewed by me and considered in my medical decision making (see chart for details).       78 year old female with a history of hypertension, thyroid disease, appendectomy, presents with concern for waxing and waning abdominal pain in the epigastrium for the last week.  Differential diagnosis includes SBO, pancreatitis, cholecystitis.  Tenderness worse in the right upper quadrant, however given other areas of pain, will obtain CT abdomen pelvis for further evaluation.  Labs and CT pending at time of transfer care to Dr. Roxanne Mins.   Final Clinical Impressions(s) / ED Diagnoses   Final diagnoses:  Abdominal pain, unspecified abdominal location  Hypokalemia    ED Discharge Orders         Ordered    US Abdomen Limited RUQ/Gall Bladder     06/16/19 0023           Gareth Morgan, MD 06/16/19 0147

## 2019-06-15 NOTE — ED Notes (Signed)
Delay in EKG, labs, med admin - pt is in the bathroom

## 2019-06-15 NOTE — ED Notes (Signed)
ED Provider at bedside. 

## 2019-06-15 NOTE — ED Triage Notes (Signed)
Pt c/o abd pain/gas feeling in both UE-states she was seen by GI-denies n/v/d-NAD-to triage in w/c-pt able to speak english/daughter with pt to fill in prn

## 2019-06-16 ENCOUNTER — Other Ambulatory Visit (HOSPITAL_COMMUNITY): Payer: Self-pay | Admitting: Gastroenterology

## 2019-06-16 ENCOUNTER — Other Ambulatory Visit (HOSPITAL_COMMUNITY): Payer: Self-pay | Admitting: Emergency Medicine

## 2019-06-16 ENCOUNTER — Ambulatory Visit (HOSPITAL_COMMUNITY)
Admission: RE | Admit: 2019-06-16 | Discharge: 2019-06-16 | Disposition: A | Discharge status: Home / Self Care | Payer: Medicare Other | Source: Ambulatory Visit | Attending: Gastroenterology | Admitting: Gastroenterology

## 2019-06-16 DIAGNOSIS — R1011 Right upper quadrant pain: Secondary | ICD-10-CM

## 2019-06-16 MED ORDER — POTASSIUM CHLORIDE CRYS ER 20 MEQ PO TBCR
40.0000 meq | EXTENDED_RELEASE_TABLET | Freq: Once | ORAL | Status: AC
  Administered 2019-06-16: 40 meq via ORAL
  Filled 2019-06-16: qty 2

## 2019-06-16 NOTE — ED Notes (Signed)
Family at bedside. 

## 2019-06-16 NOTE — Discharge Instructions (Addendum)
Come back in the morning for an ultrasound of your gallbladder.  Continue working with Dr. Collene Mares regarding your abdominal pain.  Return if symptoms are getting worse.

## 2019-06-16 NOTE — ED Notes (Signed)
ED Provider at bedside. 

## 2019-06-16 NOTE — ED Provider Notes (Signed)
Care assumed from Dr. Billy Fischer, patient with abdominal pain pending labs and CT of abdomen and pelvis.  Labs are significant only for mild hypokalemia, and she is given a dose of oral potassium.  WBC is normal with normal differential and hepatic function studies are all normal.  CT scan shows no evidence of any acute process in the abdomen, specifically no signs of cholecystitis.  On reexam, she continues to have epigastric and right upper quadrant tenderness.  She will be brought back in the morning for gallbladder ultrasound.  She is referred back to her gastroenterologist for ongoing outpatient work-up.  Results for orders placed or performed during the hospital encounter of 06/15/19  Urinalysis, Routine w reflex microscopic  Result Value Ref Range   Color, Urine STRAW (A) YELLOW   APPearance CLEAR CLEAR   Specific Gravity, Urine 1.010 1.005 - 1.030   pH 7.0 5.0 - 8.0   Glucose, UA NEGATIVE NEGATIVE mg/dL   Hgb urine dipstick TRACE (A) NEGATIVE   Bilirubin Urine NEGATIVE NEGATIVE   Ketones, ur NEGATIVE NEGATIVE mg/dL   Protein, ur NEGATIVE NEGATIVE mg/dL   Nitrite NEGATIVE NEGATIVE   Leukocytes,Ua TRACE (A) NEGATIVE  CBC with Differential  Result Value Ref Range   WBC 8.0 4.0 - 10.5 K/uL   RBC 4.38 3.87 - 5.11 MIL/uL   Hemoglobin 13.1 12.0 - 15.0 g/dL   HCT 40.7 36.0 - 46.0 %   MCV 92.9 80.0 - 100.0 fL   MCH 29.9 26.0 - 34.0 pg   MCHC 32.2 30.0 - 36.0 g/dL   RDW 15.4 11.5 - 15.5 %   Platelets 375 150 - 400 K/uL   nRBC 0.0 0.0 - 0.2 %   Neutrophils Relative % 63 %   Neutro Abs 5.1 1.7 - 7.7 K/uL   Lymphocytes Relative 28 %   Lymphs Abs 2.2 0.7 - 4.0 K/uL   Monocytes Relative 8 %   Monocytes Absolute 0.6 0.1 - 1.0 K/uL   Eosinophils Relative 1 %   Eosinophils Absolute 0.1 0.0 - 0.5 K/uL   Basophils Relative 0 %   Basophils Absolute 0.0 0.0 - 0.1 K/uL   Immature Granulocytes 0 %   Abs Immature Granulocytes 0.02 0.00 - 0.07 K/uL  Comprehensive metabolic panel  Result Value  Ref Range   Sodium 140 135 - 145 mmol/L   Potassium 3.3 (L) 3.5 - 5.1 mmol/L   Chloride 106 98 - 111 mmol/L   CO2 23 22 - 32 mmol/L   Glucose, Bld 105 (H) 70 - 99 mg/dL   BUN 15 8 - 23 mg/dL   Creatinine, Ser 0.62 0.44 - 1.00 mg/dL   Calcium 9.6 8.9 - 10.3 mg/dL   Total Protein 7.1 6.5 - 8.1 g/dL   Albumin 4.0 3.5 - 5.0 g/dL   AST 17 15 - 41 U/L   ALT 22 0 - 44 U/L   Alkaline Phosphatase 68 38 - 126 U/L   Total Bilirubin 0.4 0.3 - 1.2 mg/dL   GFR calc non Af Amer >60 >60 mL/min   GFR calc Af Amer >60 >60 mL/min   Anion gap 11 5 - 15  Lipase, blood  Result Value Ref Range   Lipase 22 11 - 51 U/L  Urinalysis, Microscopic (reflex)  Result Value Ref Range   RBC / HPF 0-5 0 - 5 RBC/hpf   WBC, UA 0-5 0 - 5 WBC/hpf   Bacteria, UA NONE SEEN NONE SEEN   Squamous Epithelial / LPF 0-5 0 - 5  Troponin I (High Sensitivity)  Result Value Ref Range   Troponin I (High Sensitivity) 2 <18 ng/L   Ct Abdomen Pelvis W Contrast  Result Date: 06/15/2019 CLINICAL DATA:  Bowel obstruction, low-grade abdominal pain, evaluate for SBO, pancreatitis or cholecystitis. EXAM: CT ABDOMEN AND PELVIS WITH CONTRAST TECHNIQUE: Multidetector CT imaging of the abdomen and pelvis was performed using the standard protocol following bolus administration of intravenous contrast. CONTRAST:  112mL OMNIPAQUE IOHEXOL 300 MG/ML  SOLN COMPARISON:  CT abdomen pelvis 06/18/2017, renal ultrasound 06/12/2019 FINDINGS: Lower chest: Lung bases are clear. Normal heart size. No pericardial effusion. Aortic leaflet calcifications. Hepatobiliary: No focal liver abnormality is seen. No gallstones, gallbladder wall thickening, or biliary dilatation. Pancreas: Unremarkable. No pancreatic ductal dilatation or surrounding inflammatory changes. Spleen: Normal in size without focal abnormality. Adrenals/Urinary Tract: Adrenal glands are unremarkable. Kidneys are normal, without renal calculi, focal lesion, or hydronephrosis. Bladder is  unremarkable. Stomach/Bowel: Distal esophagus, stomach and duodenal sweep are unremarkable. No bowel wall thickening or dilatation. No evidence of obstruction. The appendix is surgically absent. Moderate volume stool through the colon. Vascular/Lymphatic: Atherosclerotic plaque within the normal caliber aorta. No suspicious or enlarged lymph nodes in the included lymphatic chains. Reproductive: Retroflexed uterus with a densely calcified fundal subserosal fibroid. No concerning adnexal lesions Other: No abdominopelvic free fluid or free gas. No bowel containing hernias. Small fat containing umbilical hernia Musculoskeletal: Mild dextrocurvature of the thoracolumbar spine insert DJD spine No acute osseous abnormality or suspicious osseous lesion. L4-5 interbody spacer placement with L5 posterior decompression. IMPRESSION: No acute intra-abdominal process. Specifically, no bowel obstruction or CT evidence of pancreatitis or cholecystitis. Retroflexed fibroid uterus. Aortic Atherosclerosis (ICD10-I70.0). Electronically Signed   By: Lovena Le M.D.   On: 06/15/2019 23:56   Images viewed by me.    Delora Fuel, MD 71/21/97 [phone]

## 2019-06-19 ENCOUNTER — Other Ambulatory Visit (HOSPITAL_COMMUNITY): Payer: Self-pay | Admitting: Gastroenterology

## 2019-06-19 DIAGNOSIS — R1011 Right upper quadrant pain: Secondary | ICD-10-CM

## 2019-06-23 ENCOUNTER — Ambulatory Visit (HOSPITAL_COMMUNITY)
Admission: RE | Admit: 2019-06-23 | Discharge: 2019-06-23 | Disposition: A | Discharge status: Home / Self Care | Payer: Medicare Other | Source: Ambulatory Visit | Attending: Gastroenterology | Admitting: Gastroenterology

## 2019-06-23 ENCOUNTER — Other Ambulatory Visit: Payer: Self-pay

## 2019-06-23 DIAGNOSIS — R1011 Right upper quadrant pain: Secondary | ICD-10-CM | POA: Diagnosis present

## 2019-06-23 MED ORDER — TECHNETIUM TC 99M MEBROFENIN IV KIT
5.0000 | PACK | Freq: Once | INTRAVENOUS | Status: AC | PRN
  Administered 2019-06-23: 5 via INTRAVENOUS

## 2019-08-29 ENCOUNTER — Ambulatory Visit (INDEPENDENT_AMBULATORY_CARE_PROVIDER_SITE_OTHER): Payer: Medicare Other | Admitting: Orthopaedic Surgery

## 2019-08-29 ENCOUNTER — Ambulatory Visit (INDEPENDENT_AMBULATORY_CARE_PROVIDER_SITE_OTHER): Payer: Medicare Other

## 2019-08-29 ENCOUNTER — Encounter: Payer: Self-pay | Admitting: Orthopaedic Surgery

## 2019-08-29 VITALS — Ht 60.0 in | Wt 182.0 lb

## 2019-08-29 DIAGNOSIS — M7062 Trochanteric bursitis, left hip: Secondary | ICD-10-CM | POA: Diagnosis not present

## 2019-08-29 MED ORDER — BUPIVACAINE HCL 0.25 % IJ SOLN
2.0000 mL | INTRAMUSCULAR | Status: AC | PRN
  Administered 2019-08-29: 16:00:00 2 mL via INTRA_ARTICULAR

## 2019-08-29 MED ORDER — METHYLPREDNISOLONE ACETATE 40 MG/ML IJ SUSP
40.0000 mg | INTRAMUSCULAR | Status: AC | PRN
  Administered 2019-08-29: 40 mg via INTRA_ARTICULAR

## 2019-08-29 MED ORDER — LIDOCAINE HCL 1 % IJ SOLN
3.0000 mL | INTRAMUSCULAR | Status: AC | PRN
  Administered 2019-08-29: 3 mL

## 2019-08-29 NOTE — Progress Notes (Signed)
Office Visit Note   Patient: Rebecca Osborn           Date of Birth: 11-19-1941           MRN: [id number] Visit Date: 08/29/2019              Requested by: [name], MD [address] [place],  [place] [address] PCP: [name], MD   Assessment & Plan: Visit Diagnoses:  1. Trochanteric bursitis of left hip     Plan: Impression is left hip trochanteric bursitis.  We will inject this with cortisone today.  I will also provide her with an iliotibial band stretching program.  She will follow-up with [place] as needed.  Follow-Up Instructions: Return if symptoms worsen or fail to improve.   Orders:  Orders Placed This Encounter  Procedures   Large Joint Inj   XR HIP UNILAT W OR W/O PELVIS 2-3 VIEWS LEFT   No orders of the defined types were placed in this encounter.     Procedures: Large Joint Inj: L greater trochanter on 08/29/2019 4:01 PM Indications: pain Details: 22 G needle, lateral approach Medications: 2 mL bupivacaine 0.25 %; 3 mL lidocaine 1 %; 40 mg methylPREDNISolone acetate 40 MG/ML      Clinical Data: No additional findings.   Subjective: Chief Complaint  Patient presents with   Left Hip - Pain    HPI patient is a pleasant 78 year old female who presents our clinic today with left lateral hip pain.  This began approximately 3 months ago.  No known injury or change in activity.  She does note an occasional groin pain but her pain is primarily to the lateral hip.  This is worse when she goes from a seated to standing position as well as when she is standing or walking for too long period of time as well as when she flexes or externally rotates the hip.  She does have marked pain sleeping on the left side as well.  She denies any radicular symptoms.  She was previously seen in [place] where she was referred to Dr. Kathyrn Sheriff for her back.  She has been referred to [place] by Dr. Kathyrn Sheriff.  Her daughter who is in the room with her states that she had  previously been on diclofenac for quite some time and was getting GI upset.  She has been off of this for about 3 months which is about a period of time that her pain is been ongoing.  She also notes a remote history of right hip trochanteric bursitis which was injected a while back.  Review of Systems as detailed in HPI.  All others reviewed and are negative.  Objective: Vital Signs: Ht 5' (1.524 m)    Wt 182 lb (82.6 kg)    BMI 35.54 kg/m   Physical Exam well-developed and well-nourished female in no acute distress.  Alert and oriented x3.  Ortho Exam examination of the left hip reveals marked tenderness over the trochanteric bursa.  She has a negative logroll and negative Fater.  Negative straight leg raise.  She has increased pain with external rotation of the hip.  She is neurovascular intact distally.  Specialty Comments:  No specialty comments available.  Imaging: Xr Hip Unilat W Or W/o Pelvis 2-3 Views Left  Result Date: 08/29/2019 Minimal joint space narrowing    PMFS History: Patient Active Problem List   Diagnosis Date Noted   LOWER LEG, ARTHRITIS, DEGEN./OSTEO 06/25/2008   HIP PAIN 06/25/2008  [place] DEGENERATION 06/25/2008   BURSITIS, HIP 06/25/2008   Past Medical History:  Diagnosis Date   Arthritis    Asthma    Constipation    Hypertension    Thyroid disease     Family History  Problem Relation Age of Onset   Asthma Brother    Hypertension Brother     Past Surgical History:  Procedure Laterality Date   APPENDECTOMY     BACK SURGERY     JOINT REPLACEMENT     KNEE ARTHROSCOPY     Social History   Occupational History   Not on file  Tobacco Use   Smoking status: Never Smoker   Smokeless tobacco: Never Used  Substance and Sexual Activity   Alcohol use: No   Drug use: No   Sexual activity: Not on file

## 2019-09-06 ENCOUNTER — Encounter: Payer: Self-pay | Admitting: Orthopaedic Surgery

## 2019-09-06 ENCOUNTER — Ambulatory Visit (INDEPENDENT_AMBULATORY_CARE_PROVIDER_SITE_OTHER): Payer: Medicare Other | Admitting: Orthopaedic Surgery

## 2019-09-06 VITALS — Ht 60.0 in | Wt 182.0 lb

## 2019-09-06 DIAGNOSIS — M7062 Trochanteric bursitis, left hip: Secondary | ICD-10-CM | POA: Diagnosis not present

## 2019-09-06 MED ORDER — CELECOXIB 100 MG PO CAPS: 100.0000 mg | ORAL_CAPSULE | Freq: Two times a day (BID) | ORAL | 2 refills | Status: DC | PRN

## 2019-09-06 MED ORDER — ACETAMINOPHEN-CODEINE #3 300-30 MG PO TABS: 1.0000 | ORAL_TABLET | Freq: Three times a day (TID) | ORAL | 0 refills | Status: DC | PRN

## 2019-09-06 NOTE — Progress Notes (Signed)
   Office Visit Note   Patient: Rebecca Osborn           Date of Birth: Oct 29, 1941           MRN: [id number] Visit Date: 09/06/2019              Requested by: [name], MD [address] [place],  [place] [address] PCP: [name], MD   Assessment & Plan: Visit Diagnoses:  1. Trochanteric bursitis, left hip     Plan: impression is left hip trochanteric bursitis.  Patient needs to give this another week for injection.  She will let us know if she is still in pain one week from tomorrow.  In the meantime, I have called in celebrex per her request as well as tylenol #3.   Follow-Up Instructions: Return if symptoms worsen or fail to improve.   Orders:  No orders of the defined types were placed in this encounter.  No orders of the defined types were placed in this encounter.     Procedures: No procedures performed   Clinical Data: No additional findings.   Subjective: Chief Complaint  Patient presents with  . Left Hip - Pain    HPI patient is one week out from left trochanteric bursa injection.  She noticed initial improvement in pain, but the pain returned a few days ago and has worsened.  Her pain is to the lateral aspect worse with sleeping on side and with ambulation.  No new symptoms.     Objective: Vital Signs: Ht 5' (1.524 m)   Wt 182 lb (82.6 kg)   BMI 35.54 kg/m    Ortho Exam left hip has marked tenderness over greater trochanter.  Negative log roll and negative straight leg raise. She is neurovascularly intact distally.  Specialty Comments:  No specialty comments available.  Imaging: No new imaging   PMFS History: Patient Active Problem List   Diagnosis Date Noted  . 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 HIP PAIN 06/25/2008  . [place] DEGENERATION 06/25/2008  . BURSITIS, HIP 06/25/2008   Past Medical History:  Diagnosis Date  . Arthritis   . Asthma   . Constipation   . Hypertension   . Thyroid disease     Family History   Problem Relation Age of Onset  . Asthma Brother   . Hypertension Brother     Past Surgical History:  Procedure Laterality Date  . APPENDECTOMY    . BACK SURGERY    . JOINT REPLACEMENT    . KNEE ARTHROSCOPY     Social History   Occupational History  . Not on file  Tobacco Use  . Smoking status: Never Smoker  . Smokeless tobacco: Never Used  Substance and Sexual Activity  . Alcohol use: No  . Drug use: No  . Sexual activity: Not on file

## 2019-10-05 ENCOUNTER — Telehealth: Payer: Self-pay | Admitting: Orthopedic Surgery

## 2019-10-05 NOTE — Telephone Encounter (Signed)
Patient/daughter, designated contact, Ms.Rehman - [phone] - called to request appointment for "2nd opinion" on knees. Michela Pitcher would like to "come back to Dr Aline Brochure." States had some injections approximately 3 months ago by orthopaedic in [place](Dr Duran) but no other treatment; does not recall having Xrays at that time. Patient's last records of Dr Aline Brochure shows 'converted imaging' lumbar spine, 12/30/2010. Requests date 10/18/19, if possible. Discussed protocol of patient to request orthopaedic notes/reports/films, if any, for Dr Aline Brochure to review prior to scheduling.  Patient requesting 'just to talk with Dr Aline Brochure' and not await notes, which she said she can get afterward if needed.  Please advise.

## 2019-10-08 NOTE — Telephone Encounter (Signed)
appt for 18th in a new patient slot   No new problems or new patients this week

## 2019-10-09 NOTE — Telephone Encounter (Signed)
Patient aware; needs a particular day of the week; appointment scheduled.

## 2019-11-01 ENCOUNTER — Other Ambulatory Visit: Payer: Self-pay

## 2019-11-01 ENCOUNTER — Ambulatory Visit: Payer: Medicare Other

## 2019-11-01 ENCOUNTER — Telehealth: Payer: Self-pay | Admitting: Orthopedic Surgery

## 2019-11-01 ENCOUNTER — Ambulatory Visit (INDEPENDENT_AMBULATORY_CARE_PROVIDER_SITE_OTHER): Payer: Medicare Other | Admitting: Orthopedic Surgery

## 2019-11-01 VITALS — BP 136/89 | HR 97 | Ht 60.0 in | Wt 189.0 lb

## 2019-11-01 DIAGNOSIS — M25562 Pain in left knee: Secondary | ICD-10-CM

## 2019-11-01 DIAGNOSIS — G8929 Other chronic pain: Secondary | ICD-10-CM

## 2019-11-01 DIAGNOSIS — M25561 Pain in right knee: Secondary | ICD-10-CM

## 2019-11-01 MED ORDER — TRAMADOL-ACETAMINOPHEN 37.5-325 MG PO TABS: ORAL_TABLET | ORAL | 5 refills | Status: DC

## 2019-11-01 NOTE — Progress Notes (Signed)
Rebecca Osborn  11/01/2019  Body mass index is 36.91 kg/m.   HISTORY SECTION :  Chief Complaint  Patient presents with  . Knee Pain    both knees painful    HPI The patient presents for evaluation of  (mild/moderate/severe/ ) moderate pain, in the (right /left) both knees left greater than right, for several years status post left knee arthroscopy, associated with pain at night in the left knee some swelling in bilateral knee pain some difficulty with standing.  Prior treatment she has had cortisone injections and Synvisc injections  She is concerned that she may need for further surgery  She saw Dr. Donivan Scull who gave her her last injections which seemed to do fairly well   Review of Systems  Respiratory: Negative for shortness of breath.   Cardiovascular: Negative for chest pain.  Gastrointestinal: Positive for heartburn.     has a past medical history of Arthritis, Asthma, Constipation, Hypertension, and Thyroid disease.   Past Surgical History:  Procedure Laterality Date  . APPENDECTOMY    . BACK SURGERY    . JOINT REPLACEMENT    . KNEE ARTHROSCOPY      Body mass index is 36.91 kg/m.   Allergies  Allergen Reactions  . Penicillins Hives     Current Outpatient Medications:  .  acetaminophen-codeine (TYLENOL #3) 300-30 MG tablet, Take 1 tablet by mouth 3 (three) times daily as needed for moderate pain., Disp: 30 tablet, Rfl: 0 .  amLODipine (NORVASC) 10 MG tablet, Take 10 mg by mouth daily., Disp: , Rfl:  .  Bioflavonoid Products (BIOFLEX PO), Take by mouth., Disp: , Rfl:  .  celecoxib (CELEBREX) 100 MG capsule, Take 1 capsule (100 mg total) by mouth 2 (two) times daily as needed., Disp: 60 capsule, Rfl: 2 .  cyclobenzaprine (FLEXERIL) 10 MG tablet, Take 1 tablet (10 mg total) by mouth 2 (two) times daily as needed for muscle spasms., Disp: 8 tablet, Rfl: 0 .  esomeprazole (NEXIUM) 40 MG capsule, Take 40 mg by mouth daily before breakfast., Disp: , Rfl:  .   HYDROcodone-acetaminophen (NORCO/VICODIN) 5-325 MG tablet, Take 1 tablet by mouth every 6 (six) hours as needed for severe pain., Disp: 8 tablet, Rfl: 0 .  levothyroxine (SYNTHROID, LEVOTHROID) 88 MCG tablet, Take 88 mcg by mouth daily., Disp: , Rfl:  .  Multiple Vitamins-Minerals (MULTIVITAMIN WITH MINERALS) tablet, Take 1 tablet by mouth daily., Disp: , Rfl:  .  TRAMADOL HCL ER PO, Take by mouth., Disp: , Rfl:  .  traMADol-acetaminophen (ULTRACET) 37.5-325 MG tablet, 1 TAB AT NIGHT AND PRN DURING THE DAY, Disp: 30 tablet, Rfl: 5   PHYSICAL EXAM SECTION: 1) BP 136/89   Pulse 97   Ht 5' (1.524 m)   Wt 189 lb (85.7 kg)   BMI 36.91 kg/m   Body mass index is 36.91 kg/m. General appearance: Well-developed well-nourished no gross deformities  2) Cardiovascular normal pulse and perfusion in the upper  extremities normal color without edema  3) Neurologically deep tendon reflexes are equal and normal, no sensation loss or deficits no pathologic reflexes  4) Psychological: Awake alert and oriented x3 mood and affect normal  5) Skin no lacerations or ulcerations no nodularity no palpable masses, no erythema or nodularity  6) Musculoskeletal:   Right knee  Skin is normal.  The joint has no swelling or tenderness.  Range of motion is 115 degrees with full extension ligaments are stable muscle tone is normal  Left  Skin  is normal she is tender in the peripatellar region joint line is mildly tender range of motion is about 120 degrees of full extension ligaments stable muscle tone normal   MEDICAL DECISION SECTION:  Encounter Diagnoses  Name Primary?  . Chronic pain of left knee Yes  . Chronic pain of right knee     Imaging Both knees were x-rayed she has mild to moderate narrowing of both medial compartments with no secondary bone changes  Plan:  (Rx., Inj., surg., Frx, MRI/CT, XR:68)  78 year old female with progressively worsening arthritis both knees status post left knee  arthroscopy status post injection with cortisone and Orthovisc with some increasing pain primarily at night in the left knee some mild symptoms and some mild ADL issues but nothing severe  Recommend non-NSAID treatment secondary to reflux previously took Celebrex but had significant trouble with diclofenac  Start Ultracet 1 at night for pain and then as needed every 6 hours during the day  Rex-ray in a year 2:30 PM Arther Abbott, MD  11/01/2019

## 2019-11-01 NOTE — Telephone Encounter (Signed)
Fax received from [facility] [employer] [phone] / fax [phone]) regarding Tramadol prescription issued to patient today for Tramadol 37.5MG /325MG . Message noted on faxed request: "Need max daily dose. Also Tramadol 50 is prescribed. Which is correct?"

## 2019-11-02 NOTE — Telephone Encounter (Signed)
Dr Aline Brochure did not give Tramadol 50, he gave Ultracet I will call.

## 2019-11-02 NOTE — Telephone Encounter (Signed)
I called to Dr Mina Marble has written for Tramadol you wrote for Ultracet / she has not picked up the Rx from Dr Mina Marble yet, she states she will cancel his Rx and fill yours  To you FYI

## 2020-02-05 ENCOUNTER — Ambulatory Visit: Payer: Medicare Other

## 2020-05-04 ENCOUNTER — Encounter (HOSPITAL_COMMUNITY): Payer: Self-pay | Admitting: Emergency Medicine

## 2020-05-04 ENCOUNTER — Emergency Department (HOSPITAL_COMMUNITY)
Admission: EM | Admit: 2020-05-04 | Discharge: 2020-05-04 | Disposition: A | Discharge status: Home / Self Care | Payer: Medicare Other | Attending: Emergency Medicine | Admitting: Emergency Medicine

## 2020-05-04 ENCOUNTER — Other Ambulatory Visit: Payer: Self-pay

## 2020-05-04 ENCOUNTER — Emergency Department (HOSPITAL_COMMUNITY): Payer: Medicare Other

## 2020-05-04 DIAGNOSIS — Z79899 Other long term (current) drug therapy: Secondary | ICD-10-CM | POA: Insufficient documentation

## 2020-05-04 DIAGNOSIS — R110 Nausea: Secondary | ICD-10-CM | POA: Insufficient documentation

## 2020-05-04 DIAGNOSIS — J45909 Unspecified asthma, uncomplicated: Secondary | ICD-10-CM | POA: Insufficient documentation

## 2020-05-04 DIAGNOSIS — R0602 Shortness of breath: Secondary | ICD-10-CM | POA: Diagnosis not present

## 2020-05-04 DIAGNOSIS — R0600 Dyspnea, unspecified: Secondary | ICD-10-CM | POA: Diagnosis not present

## 2020-05-04 DIAGNOSIS — I10 Essential (primary) hypertension: Secondary | ICD-10-CM | POA: Diagnosis not present

## 2020-05-04 DIAGNOSIS — R079 Chest pain, unspecified: Secondary | ICD-10-CM | POA: Diagnosis present

## 2020-05-04 DIAGNOSIS — R5383 Other fatigue: Secondary | ICD-10-CM | POA: Diagnosis not present

## 2020-05-04 LAB — HEPATIC FUNCTION PANEL
ALT: 28 U/L (ref 0–44)
AST: 20 U/L (ref 15–41)
Albumin: 3.6 g/dL (ref 3.5–5.0)
Alkaline Phosphatase: 63 U/L (ref 38–126)
Bilirubin, Direct: <0.1 mg/dL (ref 0.0–0.2)
Total Bilirubin: 0.5 mg/dL (ref 0.3–1.2)
Total Protein: 6.7 g/dL (ref 6.5–8.1)

## 2020-05-04 LAB — TROPONIN I (HIGH SENSITIVITY)
Troponin I (High Sensitivity): <2 ng/L (ref <18)
Troponin I (High Sensitivity): <2 ng/L (ref <18)

## 2020-05-04 LAB — CBC
HCT: 40.7 % (ref 36.0–46.0)
Hemoglobin: 13 g/dL (ref 12.0–15.0)
MCH: 29.5 pg (ref 26.0–34.0)
MCHC: 31.9 g/dL (ref 30.0–36.0)
MCV: 92.3 fL (ref 80.0–100.0)
Platelets: 354 10*3/uL (ref 150–400)
RBC: 4.41 MIL/uL (ref 3.87–5.11)
RDW: 16.5 % — ABNORMAL HIGH (ref 11.5–15.5)
WBC: 9.2 10*3/uL (ref 4.0–10.5)
nRBC: 0 % (ref 0.0–0.2)

## 2020-05-04 LAB — BASIC METABOLIC PANEL
Anion gap: 10 (ref 5–15)
BUN: 17 mg/dL (ref 8–23)
CO2: 23 mmol/L (ref 22–32)
Calcium: 9.3 mg/dL (ref 8.9–10.3)
Chloride: 107 mmol/L (ref 98–111)
Creatinine, Ser: 0.8 mg/dL (ref 0.44–1.00)
GFR calc Af Amer: >60 mL/min (ref >60)
GFR calc non Af Amer: >60 mL/min (ref >60)
Glucose, Bld: 85 mg/dL (ref 70–99)
Potassium: 3.8 mmol/L (ref 3.5–5.1)
Sodium: 140 mmol/L (ref 135–145)

## 2020-05-04 LAB — LIPASE, BLOOD: Lipase: 23 U/L (ref 11–51)

## 2020-05-04 LAB — T4, FREE: Free T4: 1.31 ng/dL — ABNORMAL HIGH (ref 0.61–1.12)

## 2020-05-04 LAB — TSH: TSH: 2.842 u[IU]/mL (ref 0.350–4.500)

## 2020-05-04 MED ORDER — PREDNISONE 20 MG PO TABS
60.0000 mg | ORAL_TABLET | ORAL | Status: AC
  Administered 2020-05-04: 60 mg via ORAL
  Filled 2020-05-04: qty 3

## 2020-05-04 MED ORDER — SODIUM CHLORIDE 0.9% FLUSH: 3.0000 mL | Freq: Once | INTRAVENOUS | Status: DC

## 2020-05-04 MED ORDER — PREDNISONE 20 MG PO TABS
40.0000 mg | ORAL_TABLET | Freq: Every day | ORAL | 0 refills | Status: DC
Start: 2020-05-04 — End: 2021-09-05

## 2020-05-04 MED ORDER — ALBUTEROL SULFATE HFA 108 (90 BASE) MCG/ACT IN AERS
2.0000 | INHALATION_SPRAY | Freq: Four times a day (QID) | RESPIRATORY_TRACT | Status: DC
  Administered 2020-05-04: 2 via RESPIRATORY_TRACT
  Filled 2020-05-04: qty 6.7

## 2020-05-04 NOTE — ED Provider Notes (Signed)
[facility] EMERGENCY DEPARTMENT Provider Note   CSN: [id number] Arrival date & time: 05/04/20  1311     History Chief Complaint  Patient presents with  . Chest Pain  . Shortness of Breath    Rebecca Osborn is a 79 y.o. female.  HPI    Patient presents with her son who is a retired Sales promotion account executive.  He helps with the HPI. The patient speaks [place], but does require some assistance. Patient is generally well, though she has a history of thyroid dysfunction, hypertension.  She has been taking her medication regularly. She notes that over the past few days she has had dyspnea, fatigue, nausea, but no ability to vomit anything, nor productive cough.  Son states that symptoms may have begun after the patient had her second Covid vaccine dose about 1 month ago, but have progressed over the past few days. No fever, no pain.  Yesterday, she went to urgent care, was started on antibiotics, ostensibly for ear infection.  She notes that she has taken 2 doses of the medication thus far.  Past Medical History:  Diagnosis Date  . Arthritis   . Asthma   . Constipation   . Hypertension   . Thyroid disease     Patient Active Problem List   Diagnosis Date Noted  . 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 HIP PAIN 06/25/2008  . [place] DEGENERATION 06/25/2008  . BURSITIS, HIP 06/25/2008    Past Surgical History:  Procedure Laterality Date  . APPENDECTOMY    . BACK SURGERY    . JOINT REPLACEMENT    . KNEE ARTHROSCOPY       OB History   No obstetric history on file.     Family History  Problem Relation Age of Onset  . Asthma Brother   . Hypertension Brother     Social History   Tobacco Use  . Smoking status: Never Smoker  . Smokeless tobacco: Never Used  Substance Use Topics  . Alcohol use: No  . Drug use: No    Home Medications Prior to Admission medications   Medication Sig Start Date End Date Taking? Authorizing Provider    acetaminophen-codeine (TYLENOL #3) 300-30 MG tablet Take 1 tablet by mouth 3 (three) times daily as needed for moderate pain. 09/06/19   Aundra Dubin, PA-C  amLODipine (NORVASC) 10 MG tablet Take 10 mg by mouth daily.    [provider]  Bioflavonoid Products (BIOFLEX PO) Take by mouth.    [provider]  celecoxib (CELEBREX) 100 MG capsule Take 1 capsule (100 mg total) by mouth 2 (two) times daily as needed. 09/06/19   Aundra Dubin, PA-C  cyclobenzaprine (FLEXERIL) 10 MG tablet Take 1 tablet (10 mg total) by mouth 2 (two) times daily as needed for muscle spasms. 12/20/17   Little, Wenda Overland, MD  esomeprazole (NEXIUM) 40 MG capsule Take 40 mg by mouth daily before breakfast.    [provider]  HYDROcodone-acetaminophen (NORCO/VICODIN) 5-325 MG tablet Take 1 tablet by mouth every 6 (six) hours as needed for severe pain. 12/20/17   Little, Wenda Overland, MD  levothyroxine (SYNTHROID, LEVOTHROID) 88 MCG tablet Take 88 mcg by mouth daily.    [provider]  Multiple Vitamins-Minerals (MULTIVITAMIN WITH MINERALS) tablet Take 1 tablet by mouth daily.    [provider]  TRAMADOL HCL ER PO Take by mouth.    [provider]  traMADol-acetaminophen (ULTRACET) 37.5-325 MG tablet 1 TAB AT  NIGHT AND PRN DURING THE DAY 11/01/19   Carole Civil, MD    Allergies    Penicillins  Review of Systems   Review of Systems  Constitutional:       Per HPI, otherwise negative  HENT:       Per HPI, otherwise negative  Respiratory:       Per HPI, otherwise negative  Cardiovascular:       Per HPI, otherwise negative  Gastrointestinal: Positive for nausea. Negative for vomiting.  Endocrine:       Negative aside from HPI  Genitourinary:       Neg aside from HPI   Musculoskeletal:       Per HPI, otherwise negative  Skin: Negative.   Neurological: Negative for syncope.    Physical Exam Updated Vital Signs BP 124/64   Pulse 88   Temp 98  F (36.7 C) (Oral)   Resp 14   Ht 5\' 1"  (1.549 m)   Wt 83.5 kg   SpO2 98%   BMI 34.77 kg/m   Physical Exam Vitals and nursing note reviewed.  Constitutional:      General: She is not in acute distress.    Appearance: She is well-developed.  HENT:     Head: Normocephalic and atraumatic.  Eyes:     Conjunctiva/sclera: Conjunctivae normal.  Cardiovascular:     Rate and Rhythm: Normal rate and regular rhythm.  Pulmonary:     Effort: Pulmonary effort is normal. No respiratory distress.     Breath sounds: Normal breath sounds. No stridor.  Abdominal:     General: There is no distension.  Skin:    General: Skin is warm and dry.  Neurological:     Mental Status: She is alert and oriented to person, place, and time.     Cranial Nerves: No cranial nerve deficit.     ED Results / Procedures / Treatments   Labs (all labs ordered are listed, but only abnormal results are displayed) Labs Reviewed  CBC - Abnormal; Notable for the following components:      Result Value   RDW 16.5 (*)    All other components within normal limits  T4, FREE - Abnormal; Notable for the following components:   Free T4 1.31 (*)    All other components within normal limits  BASIC METABOLIC PANEL  HEPATIC FUNCTION PANEL  LIPASE, BLOOD  TSH  TROPONIN I (HIGH SENSITIVITY)  TROPONIN I (HIGH SENSITIVITY)    EKG EKG Interpretation  Date/Time:  Saturday May 04 2020 15:57:29 EDT Ventricular Rate:  91 PR Interval:    QRS Duration: 90 QT Interval:  381 QTC Calculation: 469 R Axis:   46 Text Interpretation: Sinus rhythm Abnormal R-wave progression, early transition Otherwise within normal limits Confirmed by Carmin Muskrat [phone]) on 05/04/2020 4:49:28 PM   Radiology DG Chest 2 View  Result Date: 05/04/2020 CLINICAL DATA:  Onset of shortness of breath and difficulty breathing 2 days ago, some nausea, under real-time, history asthma, hypertension EXAM: CHEST - 2 VIEW COMPARISON:  12/14/2018 FINDINGS:  Normal heart size, mediastinal contours, and pulmonary vascularity. Slightly decreased lung volumes with minimal bibasilar atelectasis. Lungs otherwise clear. No pulmonary infiltrate, pleural effusion or pneumothorax. Osseous demineralization. IMPRESSION: Decreased lung volumes with bibasilar atelectasis. Electronically Signed   By: Lavonia Dana M.D.   On: 05/04/2020 14:41    Procedures Procedures (including critical care time)  Medications Ordered in ED Medications  sodium chloride flush (NS) 0.9 % injection 3 mL (has  no administration in time range)  albuterol (VENTOLIN HFA) 108 (90 Base) MCG/ACT inhaler 2 puff (has no administration in time range)  predniSONE (DELTASONE) tablet 60 mg (has no administration in time range)    ED Course  I have reviewed the triage vital signs and the nursing notes.  Pertinent labs & imaging results that were available during my care of the patient were reviewed by me and considered in my medical decision making (see chart for details).   5:45 PM Patient calm, in no distress, awake, alert, hemodynamically unremarkable, no hypoxia, no increased work of breathing, no oxygen requirement. I have reviewed the labs, x-ray, discussed with the patient and her son.  No evidence for pneumonia, no substantial lab abnormalities, no pneumothorax or other acute findings.  With some consideration of bronchitis contributing to her symptoms she will start short course of steroids, bronchodilators, to which she and her son are amenable.  Patient discharged in stable condition. Final Clinical Impression(s) / ED Diagnoses Final diagnoses:  Shortness of breath    Rx / DC Orders ED Discharge Orders         Ordered    predniSONE (DELTASONE) 20 MG tablet  Daily with breakfast     05/04/20 1748           Carmin Muskrat, MD 05/04/20 [phone]

## 2020-05-04 NOTE — ED Notes (Signed)
Patient given discharge instructions. Questions were answered. Patient verbalized understanding of discharge instructions and care at home.  

## 2020-05-04 NOTE — ED Triage Notes (Signed)
Pt.; stated, I started having some SOB very hard to breath 2 days ago with some nausea. And in the upper of my stomach it feels like Im hungry all the time.

## 2020-05-04 NOTE — Discharge Instructions (Signed)
As discussed, your evaluation today has been largely reassuring.  But, it is important that you monitor your condition carefully, and do not hesitate to return to the ED if you develop new, or concerning changes in your condition.  Please take the prescribed steroids for the next 4 days.  Please use the provided albuterol inhaler every 4 hours the next 2 days, then as needed.  Otherwise, please follow-up with your physician for appropriate ongoing care.

## 2020-06-02 IMAGING — NM NUCLEAR MEDICINE HEPATOBILIARY IMAGING WITH GALLBLADDER EF
2 series · 12 of 12 positions shown · non-contrast
Comparison: None

CLINICAL DATA: RIGHT upper quadrant abdominal pain without nausea
or vomiting

EXAM:
NUCLEAR MEDICINE HEPATOBILIARY IMAGING WITH GALLBLADDER EF
TECHNIQUE: Sequential images of the abdomen were obtained [DATE] minutes
following intravenous administration of radiopharmaceutical. After
oral ingestion of Ensure, gallbladder ejection fraction was
determined. At 60 min, normal ejection fraction is greater than 33%.
RADIOPHARMACEUTICALS:  5.0 mCi Qc-CCm  Choletec IV

[he hepatobiliary · 4.52mm/px · 6 of 60 frames shown (1 of 2)]
[frame 6/60]
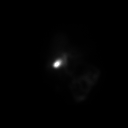
[frame 16/60]
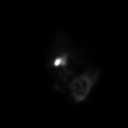
[frame 26/60]
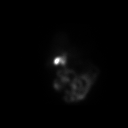
[frame 36/60]
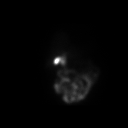
[frame 46/60]
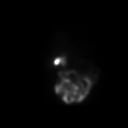
[frame 56/60]
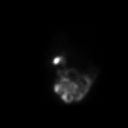

[he hepatobiliary · 4.52mm/px · 6 of 48 frames shown (2 of 2)]
[frame 5/48]
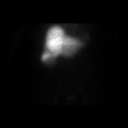
[frame 13/48]
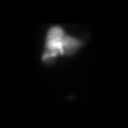
[frame 21/48]
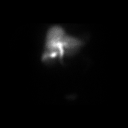
[frame 29/48]
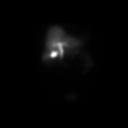
[frame 37/48]
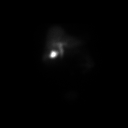
[frame 45/48]
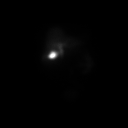

[12 of 12 positions shown; findings below may reference images not displayed]

FINDINGS: Normal tracer extraction from bloodstream indicating normal
hepatocellular function.

Normal excretion of tracer into biliary tree.

Gallbladder visualized at 15 min.

Small bowel visualized at 23 min.

No hepatic retention of tracer.

Subjectively normal emptying of tracer from gallbladder following
fatty meal stimulation.

Calculated gallbladder ejection fraction is 76%.

Patient reported no symptoms following Ensure ingestion.

Normal gallbladder ejection fraction following Ensure ingestion is
greater than 33% at 1 hour.
IMPRESSION: Normal exam.

## 2020-10-14 ENCOUNTER — Encounter: Payer: Self-pay | Admitting: Orthopedic Surgery

## 2021-09-04 NOTE — Progress Notes (Signed)
Synopsis: Referred for dyspnea by [name], MD  Subjective:   PATIENT ID: Rebecca Osborn: female DOB: 05/03/1941, MRN: [id number]  Chief Complaint  Patient presents with   Consult    Consult for SOB for last 6 months with c/o chest pain. They thought it was heart burn however this was ruled out by GI. She also had endoscopy and last CT 2020. Reports the SOB is worse lying flat.     80yF with history of asthma, referred for dyspnea. Vaccinated for coivd and pneumonia. Never had covid infection.   She has substernal pain, worse with deep breaths over last 6 months. Also burning in nature. Worse at night but she is unsure if there is positional component.   Had an EGD in January with [facility] in [place], read as some gastritis by their report. She has been on dexilant since then.   She thinks she had an echo done with [facility] but has not had stress test.  She got course of steroids for her neck in September and she thinks her breathing was improved somewhat.  She says she had asthma in 38s. She was on an inhaler but doesn't recall which. Thinks it was probably albuterol inhaler. They later told me during the visit that she is on trelegy and singulair.   Otherwise pertinent review of systems is negative.  There is a lot of asthma in the family.   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 She has 2 cats. No pet bird. No recent travel outside of [place].   Past Medical History:  Diagnosis Date   Arthritis    Asthma    Constipation    Hypertension    Thyroid disease      Family History  Problem Relation Age of Onset   Asthma Brother    Hypertension Brother      Past Surgical History:  Procedure Laterality Date   APPENDECTOMY     BACK SURGERY     JOINT REPLACEMENT     KNEE ARTHROSCOPY      Social History   Socioeconomic History   Marital status: Widowed    Spouse name: Not on file   Number of children: Not on file   Years of education: Not on file   Highest  education level: Not on file  Occupational History   Not on file  Tobacco Use   Smoking status: Never   Smokeless tobacco: Never  Substance and Sexual Activity   Alcohol use: No   Drug use: No   Sexual activity: Not on file  Other Topics Concern   Not on file  Social History Narrative   Not on file   Social Determinants of Health   Financial Resource Strain: Not on file  Food Insecurity: Not on file  Transportation Needs: Not on file  Physical Activity: Not on file  Stress: Not on file  Social Connections: Not on file  Intimate Partner Violence: Not on file     Allergies  Allergen Reactions   Penicillins Hives     Outpatient Medications Prior to Visit  Medication Sig Dispense Refill   acetaminophen-codeine (TYLENOL #3) 300-30 MG tablet Take 1 tablet by mouth 3 (three) times daily as needed for moderate pain. 30 tablet 0   amLODipine (NORVASC) 10 MG tablet Take 10 mg by mouth daily.     Bioflavonoid Products (BIOFLEX PO) Take by mouth.     celecoxib (CELEBREX) 100 MG capsule Take 1 capsule (100 mg total)  by mouth 2 (two) times daily as needed. 60 capsule 2   cyclobenzaprine (FLEXERIL) 10 MG tablet Take 1 tablet (10 mg total) by mouth 2 (two) times daily as needed for muscle spasms. 8 tablet 0   levothyroxine (SYNTHROID, LEVOTHROID) 88 MCG tablet Take 88 mcg by mouth daily.     Multiple Vitamins-Minerals (MULTIVITAMIN WITH MINERALS) tablet Take 1 tablet by mouth daily.     esomeprazole (NEXIUM) 40 MG capsule Take 40 mg by mouth daily before breakfast.     HYDROcodone-acetaminophen (NORCO/VICODIN) 5-325 MG tablet Take 1 tablet by mouth every 6 (six) hours as needed for severe pain. (Patient not taking: Reported on 09/05/2021) 8 tablet 0   predniSONE (DELTASONE) 20 MG tablet Take 2 tablets (40 mg total) by mouth daily with breakfast. For the next four days (Patient not taking: Reported on 09/05/2021) 8 tablet 0   TRAMADOL HCL ER PO Take by mouth. (Patient not taking: Reported on  09/05/2021)     traMADol-acetaminophen (ULTRACET) 37.5-325 MG tablet 1 TAB AT NIGHT AND PRN DURING THE DAY (Patient not taking: Reported on 09/05/2021) 30 tablet 5   No facility-administered medications prior to visit.       Objective:   Physical Exam:  General appearance: 80 y.o., female, NAD, conversant  Eyes: anicteric sclerae, moist conjunctivae; no lid-lag; PERRL, tracking appropriately HENT: NCAT; oropharynx, MMM, no mucosal ulcerations; normal hard and soft palate Neck: Trachea midline; no lymphadenopathy, no JVD Lungs: faint rhonchi, no crackles, no wheeze, with normal respiratory effort CV: RRR, no MRGs  Abdomen: Soft, non-tender; non-distended, BS present  Extremities: No peripheral edema, radial and DP pulses present bilaterally  Skin: Normal temperature, turgor and texture; no rash Psych: Appropriate affect Neuro: Alert and oriented to person and place, no focal deficit    Vitals:   09/05/21 1501  BP: 118/78  Pulse: 94  Temp: 97.6 F (36.4 C)  TempSrc: Oral  SpO2: 97%  Weight: 175 lb 12.8 oz (79.7 kg)  Height: 5\' 1"  (1.549 m)   97% on RA BMI Readings from Last 3 Encounters:  09/05/21 33.22 kg/m  05/04/20 34.77 kg/m  11/01/19 36.91 kg/m   Wt Readings from Last 3 Encounters:  09/05/21 175 lb 12.8 oz (79.7 kg)  05/04/20 184 lb (83.5 kg)  11/01/19 189 lb (85.7 kg)     CBC    Component Value Date/Time   WBC 9.2 05/04/2020 1338   RBC 4.41 05/04/2020 1338   HGB 13.0 05/04/2020 1338   HCT 40.7 05/04/2020 1338   PLT 354 05/04/2020 1338   MCV 92.3 05/04/2020 1338   MCH 29.5 05/04/2020 1338   MCHC 31.9 05/04/2020 1338   RDW 16.5 (H) 05/04/2020 1338   LYMPHSABS 2.2 06/15/2019 2244   MONOABS 0.6 06/15/2019 2244   EOSABS 0.1 06/15/2019 2244   BASOSABS 0.0 06/15/2019 2244     Chest Imaging: CXR 05/04/20 reviewed by me remarkable for decreased lung volumes  CT A/P lung bases 05/2019 reviewed by me and unremarkable  Pulmonary Functions Testing  Results: No flowsheet data found.   Echocardiogram:   TTE 2017 normal EF, no DD      Assessment & Plan:   # DOE:  # Pleuritic and exertional chest pain: Unclear etiology. Asthma, CHF, angina, deconditioning, VTE are initial considerations.   Plan: - cbc/diff, bmp, TSH, bnp, d-dimer - PFT - she is to stop inhalers beginning today in advance of PFTs - request recently performed TTE, PFTs, CXR from [facility] - if above  unrevealing consider stress test   I spent 63 minutes dedicated to the care of this patient on the date of this encounter to include pre-visit review of records, face-to-face time with the patient discussing conditions above, post visit ordering of testing, clinical documentation with the electronic health record, and communicating necessary findings to members of the patients care team.      Maryjane Hurter, MD [facility] 09/05/2021 3:20 PM

## 2021-09-05 ENCOUNTER — Other Ambulatory Visit: Payer: Self-pay

## 2021-09-05 ENCOUNTER — Ambulatory Visit (INDEPENDENT_AMBULATORY_CARE_PROVIDER_SITE_OTHER): Payer: Medicare Other | Admitting: Student

## 2021-09-05 ENCOUNTER — Encounter: Payer: Self-pay | Admitting: Student

## 2021-09-05 VITALS — BP 118/78 | HR 94 | Temp 97.6°F | Ht 61.0 in | Wt 175.8 lb

## 2021-09-05 DIAGNOSIS — R0781 Pleurodynia: Secondary | ICD-10-CM | POA: Diagnosis not present

## 2021-09-05 DIAGNOSIS — R0609 Other forms of dyspnea: Secondary | ICD-10-CM | POA: Diagnosis not present

## 2021-09-05 NOTE — Patient Instructions (Addendum)
-   Labs today - We have ordered PFTs to check for asthma, obstructive, or restrictive lung disease. Stop all inhalers today until your breathing tests, stop singulair. You may resume them all afterward - I will request records from [facility] to see your echo, chest x ray - After reviewing all of this I'll be in touch to let you know if we need to pursue any more testing or if we need to start an - inhaler, etc - Follow up in 8 weeks

## 2021-09-06 LAB — BASIC METABOLIC PANEL
BUN: 15 mg/dL (ref 7–25)
CO2: 29 mmol/L (ref 20–32)
Calcium: 9.8 mg/dL (ref 8.6–10.4)
Chloride: 104 mmol/L (ref 98–110)
Creat: 0.6 mg/dL (ref 0.60–1.00)
Glucose, Bld: 89 mg/dL (ref 65–99)
Potassium: 4.1 mmol/L (ref 3.5–5.3)
Sodium: 140 mmol/L (ref 135–146)

## 2021-09-06 LAB — CBC WITH DIFFERENTIAL/PLATELET
Absolute Monocytes: 790 cells/uL (ref 200–950)
Basophils Absolute: 31 cells/uL (ref 0–200)
Basophils Relative: 0.3 %
Eosinophils Absolute: 218 cells/uL (ref 15–500)
Eosinophils Relative: 2.1 %
HCT: 40 % (ref 35.0–45.0)
Hemoglobin: 13.1 g/dL (ref 11.7–15.5)
Lymphs Abs: 1758 cells/uL (ref 850–3900)
MCH: 28.9 pg (ref 27.0–33.0)
MCHC: 32.8 g/dL (ref 32.0–36.0)
MCV: 88.3 fL (ref 80.0–100.0)
MPV: 10.4 fL (ref 7.5–12.5)
Monocytes Relative: 7.6 %
Neutro Abs: 7602 cells/uL (ref 1500–7800)
Neutrophils Relative %: 73.1 %
Platelets: 380 10*3/uL (ref 140–400)
RBC: 4.53 10*6/uL (ref 3.80–5.10)
RDW: 13.4 % (ref 11.0–15.0)
Total Lymphocyte: 16.9 %
WBC: 10.4 10*3/uL (ref 3.8–10.8)

## 2021-09-06 LAB — BRAIN NATRIURETIC PEPTIDE: Brain Natriuretic Peptide: 6 pg/mL (ref <100)

## 2021-09-06 LAB — TSH: TSH: 2.2 mIU/L (ref 0.40–4.50)

## 2021-09-06 LAB — D-DIMER, QUANTITATIVE: D-Dimer, Quant: 0.57 mcg/mL FEU — ABNORMAL HIGH (ref <0.50)

## 2021-09-17 ENCOUNTER — Ambulatory Visit: Payer: Medicare Other

## 2021-09-17 ENCOUNTER — Other Ambulatory Visit: Payer: Self-pay

## 2021-09-17 DIAGNOSIS — R0609 Other forms of dyspnea: Secondary | ICD-10-CM

## 2021-09-17 LAB — PULMONARY FUNCTION TEST
DL/VA % pred: 109 %
DL/VA: 4.61 ml/min/mmHg/L
DLCO unc % pred: 72 %
DLCO unc: 12.2 ml/min/mmHg
FEF 25-75 Post: 1.29 L/sec
FEF 25-75 Pre: 0.33 L/sec
FEF2575-%Change-Post: 287 %
FEF2575-%Pred-Post: 98 %
FEF2575-%Pred-Pre: 25 %
FEV1-%Change-Post: 42 %
FEV1-%Pred-Post: 67 %
FEV1-%Pred-Pre: 47 %
FEV1-Post: 1.13 L
FEV1-Pre: 0.8 L
FEV1FVC-%Change-Post: 21 %
FEV1FVC-%Pred-Pre: 84 %
FEV6-%Change-Post: 25 %
FEV6-%Pred-Post: 68 %
FEV6-%Pred-Pre: 55 %
FEV6-Post: 1.48 L
FEV6-Pre: 1.18 L
FEV6FVC-%Change-Post: 0 %
FEV6FVC-%Pred-Post: 105 %
FEV6FVC-%Pred-Pre: 105 %
FVC-%Change-Post: 17 %
FVC-%Pred-Post: 65 %
FVC-%Pred-Pre: 55 %
FVC-Post: 1.48 L
FVC-Pre: 1.26 L
Post FEV1/FVC ratio: 76 %
Post FEV6/FVC ratio: 100 %
Pre FEV1/FVC ratio: 63 %
Pre FEV6/FVC Ratio: 100 %
RV % pred: 98 %
RV: 2.15 L
TLC % pred: 78 %
TLC: 3.54 L

## 2021-09-22 ENCOUNTER — Ambulatory Visit (INDEPENDENT_AMBULATORY_CARE_PROVIDER_SITE_OTHER): Payer: Medicare Other | Admitting: Adult Health

## 2021-09-22 ENCOUNTER — Encounter: Payer: Self-pay | Admitting: Adult Health

## 2021-09-22 ENCOUNTER — Other Ambulatory Visit: Payer: Self-pay

## 2021-09-22 ENCOUNTER — Ambulatory Visit: Payer: Medicare Other | Admitting: Adult Health

## 2021-09-22 VITALS — BP 118/64 | HR 99 | Temp 98.2°F | Ht 60.25 in | Wt 177.8 lb

## 2021-09-22 DIAGNOSIS — R0609 Other forms of dyspnea: Secondary | ICD-10-CM

## 2021-09-22 DIAGNOSIS — J4550 Severe persistent asthma, uncomplicated: Secondary | ICD-10-CM | POA: Diagnosis not present

## 2021-09-22 DIAGNOSIS — J45909 Unspecified asthma, uncomplicated: Secondary | ICD-10-CM | POA: Insufficient documentation

## 2021-09-22 DIAGNOSIS — R0600 Dyspnea, unspecified: Secondary | ICD-10-CM | POA: Insufficient documentation

## 2021-09-22 MED ORDER — BREZTRI AEROSPHERE 160-9-4.8 MCG/ACT IN AERO
2.0000 | INHALATION_SPRAY | Freq: Two times a day (BID) | RESPIRATORY_TRACT | 6 refills | Status: DC
Start: 2021-09-22 — End: 2022-04-20

## 2021-09-22 MED ORDER — BREZTRI AEROSPHERE 160-9-4.8 MCG/ACT IN AERO
2.0000 | INHALATION_SPRAY | Freq: Two times a day (BID) | RESPIRATORY_TRACT | 0 refills | Status: DC
Start: 2021-09-22 — End: 2021-11-03

## 2021-09-22 NOTE — Patient Instructions (Addendum)
Stop TRELEGY  Begin BREZTRI 2 puffs Twice daily , rinse after use.  Set up CT Chest -PE protocol ([facility] ) Albuterol inhaler 1-2 puffs every 4hrs as needed  Activity as tolerated.  Begin Claritin 10mg  daily .  Follow up with Dr. Verlee Monte in 6 weeks and As needed   Please contact office for sooner follow up if symptoms do not improve or worsen or seek emergency care

## 2021-09-22 NOTE — Progress Notes (Signed)
@Patient  ID: Rebecca Osborn, female    DOB: March 13, 1941, 80 y.o.   MRN: [id number]  Chief Complaint  Patient presents with   Follow-up   Acute Visit    Referring provider: Pricilla Riffle  HPI: 80 year old female never smoker seen for pulmonary consult September 05, 2021 for shortness of breath and chest tightness x 6 months  TEST/EVENTS :   09/22/2021 Follow up : Asthma  Patient presents for a follow-up visit.  Patient was seen for pulmonary consult September 05, 2021 for shortness of breath and chest tightness for 6 months.  Patient has underlying asthma.  Last visit she was recommended to undergo pulmonary function test's which was completed on September 17, 2021 that showed severe airflow obstruction with FEV1 at 67%, ratio 76, FVC 65%, significant bronchodilator response (42% change) and a DLCO at 72%. Patient complains of over the last 6 months she has had increased shortness of breath with activities intermittent chest tightness and decreased activity tolerance.  She does have limitations due to knee and back pain.  She does not have significant cough or wheezing.  She is on Trelegy inhaler daily for her asthma.  She says she cannot tell if this is working or not.  She says she has had a cardiac work-up done at [facility] and was reported as okay. Labs last visit showed normal CBC, BNP and TSH.  D-dimer was minimally elevated at 0.57   Allergies  Allergen Reactions   Penicillins Hives    Immunization History  Administered Date(s) Administered   Fluad Quad(high Dose 65+) 08/24/2021   Influenza, High Dose Seasonal PF 10/23/2019   Moderna SARS-COV2 Booster Vaccination 11/24/2020   Moderna Sars-Covid-2 Vaccination 02/13/2020, 03/12/2020   Pneumococcal Polysaccharide-23 10/11/2012   Zoster, Live 10/09/2013    Past Medical History:  Diagnosis Date   Arthritis    Asthma    Constipation    Hypertension    Thyroid disease     Tobacco History: Social History   Tobacco Use   Smoking Status Never  Smokeless Tobacco Never   Counseling given: Not Answered   Outpatient Medications Prior to Visit  Medication Sig Dispense Refill   acetaminophen-codeine (TYLENOL #3) 300-30 MG tablet Take 1 tablet by mouth 3 (three) times daily as needed for moderate pain. 30 tablet 0   amLODipine (NORVASC) 10 MG tablet Take 10 mg by mouth daily.     Bioflavonoid Products (BIOFLEX PO) Take by mouth.     celecoxib (CELEBREX) 100 MG capsule Take 1 capsule (100 mg total) by mouth 2 (two) times daily as needed. 60 capsule 2   cyclobenzaprine (FLEXERIL) 10 MG tablet Take 1 tablet (10 mg total) by mouth 2 (two) times daily as needed for muscle spasms. 8 tablet 0   HYDROcodone-acetaminophen (NORCO/VICODIN) 5-325 MG tablet Take 1 tablet by mouth every 6 (six) hours as needed for severe pain. 8 tablet 0   levothyroxine (SYNTHROID, LEVOTHROID) 88 MCG tablet Take 88 mcg by mouth daily.     Multiple Vitamins-Minerals (MULTIVITAMIN WITH MINERALS) tablet Take 1 tablet by mouth daily.     No facility-administered medications prior to visit.     Review of Systems:   Constitutional:   No  weight loss, night sweats,  Fevers, chills,  +fatigue, or  lassitude.  HEENT:   No headaches,  Difficulty swallowing,  Tooth/dental problems, or  Sore throat,                No sneezing, itching, ear ache,  nasal congestion, post nasal drip,   CV:  No chest pain,  Orthopnea, PND, swelling in lower extremities, anasarca, dizziness, palpitations, syncope.   GI  No heartburn, indigestion, abdominal pain, nausea, vomiting, diarrhea, change in bowel habits, loss of appetite, bloody stools.   Resp:  No chest wall deformity  Skin: no rash or lesions.  GU: no dysuria, change in color of urine, no urgency or frequency.  No flank pain, no hematuria   MS:  No joint pain or swelling.  No decreased range of motion.  No back pain.    Physical Exam  BP 118/64 (BP Location: Left Arm, Patient Position: Sitting,  Cuff Size: Large)   Pulse 99   Temp 98.2 F (36.8 C) (Oral)   Ht 5' 0.25" (1.53 m)   Wt 177 lb 12.8 oz (80.6 kg)   SpO2 92%   BMI 34.44 kg/m   GEN: A/Ox3; pleasant , NAD, well nourished    HEENT:  [place]/AT,  NOSE-clear, THROAT-clear, no lesions, no postnasal drip or exudate noted.   NECK:  Supple w/ fair ROM; no JVD; normal carotid impulses w/o bruits; no thyromegaly or nodules palpated; no lymphadenopathy.    RESP  Clear  P & A; w/o, wheezes/ rales/ or rhonchi. no accessory muscle use, no dullness to percussion  CARD:  RRR, no m/r/g, tr peripheral edema, pulses intact, no cyanosis or clubbing.  GI:   Soft & nt; nml bowel sounds; no organomegaly or masses detected.   Musco: Warm bil, no deformities or joint swelling noted.   Neuro: alert, no focal deficits noted.    Skin: Warm, no lesions or rashes    Lab Results:  CBC    Component Value Date/Time   WBC 10.4 09/05/2021 1556   RBC 4.53 09/05/2021 1556   HGB 13.1 09/05/2021 1556   HCT 40.0 09/05/2021 1556   PLT 380 09/05/2021 1556   MCV 88.3 09/05/2021 1556   MCH 28.9 09/05/2021 1556   MCHC 32.8 09/05/2021 1556   RDW 13.4 09/05/2021 1556   LYMPHSABS 1,758 09/05/2021 1556   MONOABS 0.6 06/15/2019 2244   EOSABS 218 09/05/2021 1556   BASOSABS 31 09/05/2021 1556    BMET    Component Value Date/Time   NA 140 09/05/2021 1556   K 4.1 09/05/2021 1556   CL 104 09/05/2021 1556   CO2 29 09/05/2021 1556   GLUCOSE 89 09/05/2021 1556   BUN 15 09/05/2021 1556   CREATININE 0.60 09/05/2021 1556   CALCIUM 9.8 09/05/2021 1556   GFRNONAA >60 05/04/2020 1338   GFRAA >60 05/04/2020 1338    BNP    Component Value Date/Time   BNP 6 09/05/2021 1556    ProBNP No results found for: PROBNP  Imaging: No results found.    PFT Results Latest Ref Rng & Units 09/17/2021  FVC-Pre L 1.26  FVC-Predicted Pre % 55  FVC-Post L 1.48  FVC-Predicted Post % 65  Pre FEV1/FVC % % 63  Post FEV1/FCV % % 76  FEV1-Pre L 0.80   FEV1-Predicted Pre % 47  FEV1-Post L 1.13  DLCO uncorrected ml/min/mmHg 12.20  DLCO UNC% % 72  DLVA Predicted % 109  TLC L 3.54  TLC % Predicted % 78  RV % Predicted % 98    No results found for: NITRICOXIDE      Assessment & Plan:   Dyspnea Dyspnea with exertion with decreased activity tolerance-questionable etiology.  Pulmonary function testing shows severe airflow obstruction.   Symptoms may be secondary to  underlying severe airflow obstruction/asthma. Will check CT chest with PE protocol due to elevated D-dimer.  Low suspicion for PE however patient has significant change in her activity level compared to 1 year ago.  Questionable etiology  Plan  Patient Instructions  Stop TRELEGY  Begin BREZTRI 2 puffs Twice daily , rinse after use.  Set up CT Chest -PE protocol ([facility] ) Albuterol inhaler 1-2 puffs every 4hrs as needed  Activity as tolerated.  Begin Claritin 10mg  daily .  Follow up with Dr. 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 No significant change in symptoms on Trelegy.  Changed to [employer] for triggers.  Add in Claritin.   Asthma action plan discussed  Plan  Patient Instructions  Stop TRELEGY  Begin BREZTRI 2 puffs Twice daily , rinse after use.  Set up CT Chest -PE protocol ([facility] ) Albuterol inhaler 1-2 puffs every 4hrs as needed  Activity as tolerated.  Begin Claritin 10mg  daily .  Follow up with Dr. Verlee Monte in 6 weeks and As needed   Please contact office for sooner follow up if symptoms do not improve or worsen or seek emergency care           Rexene Edison, NP 09/22/2021

## 2021-09-22 NOTE — Assessment & Plan Note (Signed)
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 No significant change in symptoms on Trelegy.  Changed to [employer] for triggers.  Add in Claritin.   Asthma action plan discussed  Plan  Patient Instructions  Stop TRELEGY  Begin BREZTRI 2 puffs Twice daily , rinse after use.  Set up CT Chest -PE protocol ([facility] ) Albuterol inhaler 1-2 puffs every 4hrs as needed  Activity as tolerated.  Begin Claritin 10mg  daily .  Follow up with Dr. Verlee Monte in 6 weeks and As needed   Please contact office for sooner follow up if symptoms do not improve or worsen or seek emergency care

## 2021-09-22 NOTE — Assessment & Plan Note (Signed)
Dyspnea with exertion with decreased activity tolerance-questionable etiology.  Pulmonary function testing shows severe airflow obstruction.   Symptoms may be secondary to underlying severe airflow obstruction/asthma. Will check CT chest with PE protocol due to elevated D-dimer.  Low suspicion for PE however patient has significant change in her activity level compared to 1 year ago.  Questionable etiology  Plan  Patient Instructions  Stop TRELEGY  Begin BREZTRI 2 puffs Twice daily , rinse after use.  Set up CT Chest -PE protocol ([facility] ) Albuterol inhaler 1-2 puffs every 4hrs as needed  Activity as tolerated.  Begin Claritin 10mg  daily .  Follow up with Dr. Verlee Monte in 6 weeks and As needed   Please contact office for sooner follow up if symptoms do not improve or worsen or seek emergency care

## 2021-09-26 ENCOUNTER — Ambulatory Visit (HOSPITAL_BASED_OUTPATIENT_CLINIC_OR_DEPARTMENT_OTHER)
Admission: RE | Admit: 2021-09-26 | Discharge: 2021-09-26 | Disposition: A | Discharge status: Home / Self Care | Payer: Medicare Other | Source: Ambulatory Visit | Attending: Adult Health | Admitting: Adult Health

## 2021-09-26 ENCOUNTER — Other Ambulatory Visit: Payer: Self-pay

## 2021-09-26 ENCOUNTER — Encounter (HOSPITAL_BASED_OUTPATIENT_CLINIC_OR_DEPARTMENT_OTHER): Payer: Self-pay

## 2021-09-26 DIAGNOSIS — R0609 Other forms of dyspnea: Secondary | ICD-10-CM | POA: Insufficient documentation

## 2021-09-26 MED ORDER — IOHEXOL 350 MG/ML SOLN
100.0000 mL | Freq: Once | INTRAVENOUS | Status: AC | PRN
  Administered 2021-09-26: 100 mL via INTRAVENOUS

## 2021-09-29 NOTE — Progress Notes (Signed)
Called and spoke with  patient's daughter Rebecca Osborn [facility]), advised of results/recommendations per Rexene Edison NP.  She verbalized understanding and feels her mother is some better since starting the inhaler.  Nothing further needed.

## 2021-11-03 ENCOUNTER — Telehealth: Payer: Self-pay | Admitting: Neurology

## 2021-11-03 ENCOUNTER — Ambulatory Visit (INDEPENDENT_AMBULATORY_CARE_PROVIDER_SITE_OTHER): Payer: Medicare Other | Admitting: Neurology

## 2021-11-03 ENCOUNTER — Other Ambulatory Visit: Payer: Self-pay

## 2021-11-03 ENCOUNTER — Encounter: Payer: Self-pay | Admitting: Neurology

## 2021-11-03 VITALS — BP 127/75 | HR 95 | Ht 60.25 in | Wt 174.0 lb

## 2021-11-03 DIAGNOSIS — M5412 Radiculopathy, cervical region: Secondary | ICD-10-CM

## 2021-11-03 DIAGNOSIS — R269 Unspecified abnormalities of gait and mobility: Secondary | ICD-10-CM

## 2021-11-03 DIAGNOSIS — R4789 Other speech disturbances: Secondary | ICD-10-CM

## 2021-11-03 DIAGNOSIS — R1310 Dysphagia, unspecified: Secondary | ICD-10-CM

## 2021-11-03 NOTE — Telephone Encounter (Signed)
Medicare/medicaid, NPR order sent to GI they will reach out to the patient to schedule.

## 2021-11-03 NOTE — Progress Notes (Signed)
[facility]  PATIENT: Rebecca Osborn DOB: 1941-06-21  REFERRING DOCTOR OR PCP: Egbert Garibaldi PA-C SOURCE: Patient, primary care  _________________________________   HISTORICAL  CHIEF COMPLAINT:  Chief Complaint  Patient presents with   New Patient (Initial Visit)    Rm 2, w her son who will translating. Paper referral for altered gait. Head leaning forward. Pt reports when standing her left arm goes numb and R leg. Speech becomes blurred and having trouble speaking. Pt reports her R arm has been feeling weak 5 day ago. Pn  in the back of neck, last 4 months. Has been ambulates with a cane for last year. Has noticed change in voice.     HISTORY OF PRESENT ILLNESS:  I had the pleasure of seeing your patient, Rebecca Osborn, at [facility] for a neurologic consultation regarding her gait difficulty and more recent problems with speech and swallowing.  She is accompanied by her son, Carmell Austria, a physician.  She is a 80 year old who began to have more difficulty with her voice and coming up with the right word about 4-6 months ago.   Swallowing is difficult and food and water get stuck, also beginning a few months ago.    More recently, she has also been reporting numbness in her right side.   She is walking worse .  Stride is reduced and gait is slowed.    She has no falls.    (SHe had one bad fall in 2021 in [place] hitting her right shoulder hard).   She had a mild concussion.   Recently, she has also had neck pain and occipital pain.   The right arm feels weak and she has trouble lifting items for a longer time.    The right leg also feels weaker than her left.   The left arm is getting numb at times.     She has a long history of urinary incontinence,  that is stable.     She has been a little forgetful but will recall with a hint.   No difficulty with math or following directions.  She is still driving and doing all tasks she was doing.       She had  spinal surgery in 2007 with lumbar fusion.   She has thoracolumbar scoliosis.      She has no vascular risks (HTN, DM, smoking).   REVIEW OF SYSTEMS: Constitutional: No fevers, chills, sweats, or change in appetite Eyes: No visual changes, double vision, eye pain Ear, nose and throat: No hearing loss, ear pain, nasal congestion, sore throat Cardiovascular: No chest pain, palpitations Respiratory:  No shortness of breath at rest or with exertion.   No wheezes GastrointestinaI: No nausea, vomiting, diarrhea, abdominal pain, fecal incontinence Genitourinary:  No dysuria, urinary retention or frequency.  No nocturia. Musculoskeletal: As above  Integumentary: No rash, pruritus, skin lesions Neurological: as above Psychiatric: No depression at this time.  No anxiety Endocrine: No palpitations, diaphoresis, change in appetite, change in weigh or increased thirst Hematologic/Lymphatic:  No anemia, purpura, petechiae. Allergic/Immunologic: No itchy/runny eyes, nasal congestion, recent allergic reactions, rashes  ALLERGIES: Allergies  Allergen Reactions   Penicillins Hives    HOME MEDICATIONS:  Current Outpatient Medications:    albuterol (VENTOLIN HFA) 108 (90 Base) MCG/ACT inhaler, Inhale into the lungs as needed., Disp: , Rfl:    amLODipine (NORVASC) 10 MG tablet, Take 10 mg by mouth daily., Disp: , Rfl:    Besifloxacin HCl (BESIVANCE) 0.6 %  SUSP, Place 1 drop into the left eye as directed., Disp: , Rfl:    Bioflavonoid Products (BIOFLEX PO), Take by mouth., Disp: , Rfl:    Bromfenac Sodium (PROLENSA) 0.07 % SOLN, Place 1 drop into the left eye as directed., Disp: , Rfl:    Budeson-Glycopyrrol-Formoterol (BREZTRI AEROSPHERE) 160-9-4.8 MCG/ACT AERO, Inhale 2 puffs into the lungs in the morning and at bedtime., Disp: 10.7 g, Rfl: 6   cetirizine (ZYRTEC) 10 MG tablet, Take 10 mg by mouth as needed., Disp: , Rfl:    dexlansoprazole (DEXILANT) 60 MG capsule, Take 1 capsule by mouth daily.,  Disp: , Rfl:    famotidine (PEPCID) 20 MG tablet, Take 20 mg by mouth at bedtime., Disp: , Rfl:    fluticasone (FLONASE) 50 MCG/ACT nasal spray, Place 1 spray into both nostrils daily., Disp: , Rfl:    Fluticasone-Umeclidin-Vilant 100-62.5-25 MCG/ACT AEPB, Take 1 puff by mouth daily., Disp: , Rfl:    levothyroxine (SYNTHROID, LEVOTHROID) 88 MCG tablet, Take 88 mcg by mouth daily., Disp: , Rfl:    montelukast (SINGULAIR) 10 MG tablet, Take 10 mg by mouth as needed., Disp: , Rfl:    Multiple Vitamins-Minerals (MULTIVITAMIN WITH MINERALS) tablet, Take 1 tablet by mouth daily., Disp: , Rfl:    oxyCODONE-acetaminophen (PERCOCET/ROXICET) 5-325 MG tablet, Take 0.5 tablets by mouth daily., Disp: , Rfl:    rOPINIRole (REQUIP) 0.5 MG tablet, Take 0.5 mg by mouth as needed., Disp: , Rfl:    sodium fluoride (FLUORISHIELD) 1.1 % GEL dental gel, BRUSH 1 TIME A DAY IN THE MORNING, Disp: , Rfl:    sucralfate (CARAFATE) 1 g tablet, Take 1 g by mouth 4 (four) times daily., Disp: , Rfl:    TRULANCE 3 MG TABS, Take 1 tablet by mouth daily., Disp: , Rfl:    Vitamin D, Ergocalciferol, (DRISDOL) 1.25 MG (50000 UNIT) CAPS capsule, Take 50,000 Units by mouth once a week., Disp: , Rfl:   PAST MEDICAL HISTORY: Past Medical History:  Diagnosis Date   Arthritis    Asthma    Constipation    Hypertension    Thyroid disease     PAST SURGICAL HISTORY: Past Surgical History:  Procedure Laterality Date   APPENDECTOMY     BACK SURGERY     JOINT REPLACEMENT     KNEE ARTHROSCOPY      FAMILY HISTORY: Family History  Problem Relation Age of Onset   Heart Problems Father     SOCIAL HISTORY:  Social History   Socioeconomic History   Marital status: Widowed    Spouse name: Not on file   Number of children: 5   Years of education: Not on file   Highest education level: High school graduate  Occupational History   Not on file  Tobacco Use   Smoking status: Never   Smokeless tobacco: Never  Substance and  Sexual Activity   Alcohol use: No   Drug use: No   Sexual activity: Not on file  Other Topics Concern   Not on file  Social History Narrative   Lives with son   Right handed   Caffeine: 1 cup of tea with milk   Social Determinants of Health   Financial Resource Strain: Not on file  Food Insecurity: Not on file  Transportation Needs: Not on file  Physical Activity: Not on file  Stress: Not on file  Social Connections: Not on file  Intimate Partner Violence: Not on file     PHYSICAL EXAM  Vitals:  11/03/21 1047  BP: 127/75  Pulse: 95  Weight: 174 lb (78.9 kg)  Height: 5' 0.25" (1.53 m)    Body mass index is 33.7 kg/m.   General: The patient is well-developed and well-nourished and in no acute distress  HEENT:  Head is Edison/AT.  Sclera are anicteric.    Neck: No carotid bruits are noted.  The neck is nontender.  Cardiovascular: The heart has a regular rate and rhythm with a normal S1 and S2. There were no murmurs, gallops or rubs.    Skin: Extremities are without rash or  edema.  Musculoskeletal:  Back is nontender  Neurologic Exam  Mental status: The patient is alert and oriented x 3 at the time of the examination. The patient has apparent normal recent and remote memory, with an apparently normal attention span and concentration ability.   Speech is normal.  Cranial nerves: Extraocular movements are full. Pupils are equal, round, and reactive to light and accomodation.  Visual fields are full.  Facial symmetry is present. There is good facial sensation to soft touch bilaterally.Facial strength is normal.  Trapezius and sternocleidomastoid strength is normal.   The tongue is midline, and the patient has symmetric elevation of the soft palate. No obvious hearing deficits are noted.  Motor:  Muscle bulk is normal.   Tone is normal. Strength is  5 / 5 in all 4 extremities.   Sensory: Sensory testing is intact to pinprick, soft touch and vibration sensation in all 4  extremities.  Coordination: Cerebellar testing reveals good finger-nose-finger and heel-to-shin bilaterally.  Gait and station: Station is normal.  Her gait has a mildly reduced stride.  She takes 5 steps to turn 180 degrees.  Tandem is poor.  There was no retropulsion.  Romberg is negative.   Reflexes: Deep tendon reflexes are symmetric and normal bilaterally.   Plantar responses are flexor.    DIAGNOSTIC DATA (LABS, IMAGING, TESTING) - I reviewed patient records, labs, notes, testing and imaging myself where available.  Lab Results  Component Value Date   WBC 10.4 09/05/2021   HGB 13.1 09/05/2021   HCT 40.0 09/05/2021   MCV 88.3 09/05/2021   PLT 380 09/05/2021      Component Value Date/Time   NA 140 09/05/2021 1556   K 4.1 09/05/2021 1556   CL 104 09/05/2021 1556   CO2 29 09/05/2021 1556   GLUCOSE 89 09/05/2021 1556   BUN 15 09/05/2021 1556   CREATININE 0.60 09/05/2021 1556   CALCIUM 9.8 09/05/2021 1556   PROT 6.7 05/04/2020 1534   ALBUMIN 3.6 05/04/2020 1534   AST 20 05/04/2020 1534   ALT 28 05/04/2020 1534   ALKPHOS 63 05/04/2020 1534   BILITOT 0.5 05/04/2020 1534   GFRNONAA >60 05/04/2020 1338   GFRAA >60 05/04/2020 1338    Lab Results  Component Value Date   TSH 2.20 09/05/2021       ASSESSMENT AND PLAN  Gait disturbance - Plan: MR BRAIN W WO CONTRAST, MR CERVICAL SPINE WO CONTRAST  Dysphagia, unspecified type - Plan: MR BRAIN W WO CONTRAST  Word finding difficulty - Plan: MR BRAIN W WO CONTRAST  Cervical radiculopathy - Plan: MR CERVICAL SPINE WO CONTRAST   In summary, Ms. Wilensky is a 80 year old woman who has had a several month history of word finding difficulties and more recent dysphagia.  Additionally, she has had worsening gait with reduced stride over the last few months.  On exam, she had reduced strength in the deltoid  and biceps on the right and normal strength elsewhere.  Most likely, the right arm symptoms are due to a C5 or C6 cervical  radiculopathy.  It is possible that degenerative changes of the neck could also affect her gait if she has moderate or severe spinal stenosis.  Because of the difficulty swallowing, we need to also be concerned about stroke, tumor or other CNS pathology.  Therefore, we will check an MRI of the brain with and without contrast and MRI of the cervical spine without contrast.  If an etiology of the right arm weakness is not identified, we would need to also check an NCV/EMG study.  Based on the results she might benefit from physical therapy or referral for appropriate intervention.  She will return to see me in 3 months or sooner if there are new or worsening neurologic symptoms.  Thank you for asking me to see Ms. Keatley.  Please let me know if I can be of further assistance with her or other patients in the future.    Results can be discussed with Pryor Curia, the patient's son  Delfino Lovett A. Felecia Shelling, MD, [facility] 94/06/164, 53:74 AM Certified in Neurology, Clinical Neurophysiology, Sleep Medicine and Neuroimaging  [facility] [address], [place], [place] [address] [phone]

## 2021-11-03 NOTE — Progress Notes (Signed)
Synopsis: Referred for dyspnea by Windell Hummingbird, PA-C  Subjective:   PATIENT ID: Rebecca Osborn: female DOB: 08-Mar-1941, MRN: [id number]  Chief Complaint  Patient presents with   Follow-up    Pt states DOE but she is a little better      80yF with history of asthma, referred for dyspnea. Vaccinated for coivd and pneumonia. Never had covid infection.   She has substernal pain, worse with deep breaths over last 6 months. Also burning in nature. Worse at night but she is unsure if there is positional component.   Had an EGD in January with [facility] in [place], read as some gastritis by their report. She has been on dexilant since then.   She thinks she had an echo done with [facility] but has not had stress test.  She got course of steroids for her neck in September and she thinks her breathing was improved somewhat.  She says she had asthma in 64s. She was on an inhaler but doesn't recall which. Thinks it was probably albuterol inhaler. They later told me during the visit that she is on trelegy and singulair.   There is a lot of asthma in the family.   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 She has 2 cats. No pet bird. No recent travel outside of [place].   Interval HPI: Seen by Rexene Edison since last visit and breztri started, CTA Chest obtained with no PE and otherwise unremarkable.  They have moved cats downstairs, has also started breztri 2 puffs daily, rinses mouth after use. No courses of prednisone or ABX since last visit. Feeling better overall, less dyspnea, cough.   Otherwise pertinent review of systems is negative.    Past Medical History:  Diagnosis Date   Arthritis    Asthma    Constipation    Hypertension    Thyroid disease      Family History  Problem Relation Age of Onset   Heart Problems Father      Past Surgical History:  Procedure Laterality Date   APPENDECTOMY     BACK SURGERY     JOINT REPLACEMENT     KNEE ARTHROSCOPY      Social  History   Socioeconomic History   Marital status: Widowed    Spouse name: Not on file   Number of children: 5   Years of education: Not on file   Highest education level: High school graduate  Occupational History   Not on file  Tobacco Use   Smoking status: Never   Smokeless tobacco: Never  Substance and Sexual Activity   Alcohol use: No   Drug use: No   Sexual activity: Not on file  Other Topics Concern   Not on file  Social History Narrative   Lives with son   Right handed   Caffeine: 1 cup of tea with milk   Social Determinants of Health   Financial Resource Strain: Not on file  Food Insecurity: Not on file  Transportation Needs: Not on file  Physical Activity: Not on file  Stress: Not on file  Social Connections: Not on file  Intimate Partner Violence: Not on file     Allergies  Allergen Reactions   Penicillins Hives     Outpatient Medications Prior to Visit  Medication Sig Dispense Refill   albuterol (VENTOLIN HFA) 108 (90 Base) MCG/ACT inhaler Inhale into the lungs as needed.     amLODipine (NORVASC) 10 MG tablet Take 10 mg by mouth  daily.     Besifloxacin HCl (BESIVANCE) 0.6 % SUSP Place 1 drop into the left eye as directed.     Bioflavonoid Products (BIOFLEX PO) Take by mouth.     Bromfenac Sodium (PROLENSA) 0.07 % SOLN Place 1 drop into the left eye as directed.     Budeson-Glycopyrrol-Formoterol (BREZTRI AEROSPHERE) 160-9-4.8 MCG/ACT AERO Inhale 2 puffs into the lungs in the morning and at bedtime. 10.7 g 6   cetirizine (ZYRTEC) 10 MG tablet Take 10 mg by mouth as needed.     dexlansoprazole (DEXILANT) 60 MG capsule Take 1 capsule by mouth daily.     famotidine (PEPCID) 20 MG tablet Take 20 mg by mouth at bedtime.     fluticasone (FLONASE) 50 MCG/ACT nasal spray Place 1 spray into both nostrils daily.     levothyroxine (SYNTHROID, LEVOTHROID) 88 MCG tablet Take 88 mcg by mouth daily.     montelukast (SINGULAIR) 10 MG tablet Take 10 mg by mouth as  needed.     Multiple Vitamins-Minerals (MULTIVITAMIN WITH MINERALS) tablet Take 1 tablet by mouth daily.     oxyCODONE-acetaminophen (PERCOCET/ROXICET) 5-325 MG tablet Take 0.5 tablets by mouth daily.     rOPINIRole (REQUIP) 0.5 MG tablet Take 0.5 mg by mouth as needed.     sodium fluoride (FLUORISHIELD) 1.1 % GEL dental gel BRUSH 1 TIME A DAY IN THE MORNING     sucralfate (CARAFATE) 1 g tablet Take 1 g by mouth 4 (four) times daily.     TRULANCE 3 MG TABS Take 1 tablet by mouth daily.     Vitamin D, Ergocalciferol, (DRISDOL) 1.25 MG (50000 UNIT) CAPS capsule Take 50,000 Units by mouth once a week.     Fluticasone-Umeclidin-Vilant 100-62.5-25 MCG/ACT AEPB Take 1 puff by mouth daily.     No facility-administered medications prior to visit.       Objective:   Physical Exam:  General appearance: 80 y.o., female, NAD, conversant  Eyes: anicteric sclerae; PERRL, tracking appropriately HENT: NCAT; MMM Neck: Trachea midline; no lymphadenopathy, no JVD Lungs: CTAB, no crackles, no wheeze, with normal respiratory effort CV: RRR, no murmur  Abdomen: Soft, non-tender; non-distended, BS present  Extremities: No peripheral edema, warm Skin: Normal turgor and texture; no rash Psych: Appropriate affect Neuro: Alert and oriented to person and place, no focal deficit     Vitals:   11/04/21 1415  BP: 130/82  Pulse: 97  Temp: 98 F (36.7 C)  TempSrc: Oral  SpO2: 93%  Weight: 173 lb (78.5 kg)  Height: 5' (1.524 m)    93% on RA BMI Readings from Last 3 Encounters:  11/04/21 33.79 kg/m  11/03/21 33.70 kg/m  09/22/21 34.44 kg/m   Wt Readings from Last 3 Encounters:  11/04/21 173 lb (78.5 kg)  11/03/21 174 lb (78.9 kg)  09/22/21 177 lb 12.8 oz (80.6 kg)     CBC    Component Value Date/Time   WBC 10.4 09/05/2021 1556   RBC 4.53 09/05/2021 1556   HGB 13.1 09/05/2021 1556   HCT 40.0 09/05/2021 1556   PLT 380 09/05/2021 1556   MCV 88.3 09/05/2021 1556   MCH 28.9 09/05/2021  1556   MCHC 32.8 09/05/2021 1556   RDW 13.4 09/05/2021 1556   LYMPHSABS 1,758 09/05/2021 1556   MONOABS 0.6 06/15/2019 2244   EOSABS 218 09/05/2021 1556   BASOSABS 31 09/05/2021 1556     Chest Imaging: CTA Chest 10/28 reviewed by me remarkable for only for small lung volumes  CXR  05/04/20 reviewed by me remarkable for decreased lung volumes  CT A/P lung bases 05/2019 reviewed by me and unremarkable  Pulmonary Functions Testing Results: PFT Results Latest Ref Rng & Units 09/17/2021  FVC-Pre L 1.26  FVC-Predicted Pre % 55  FVC-Post L 1.48  FVC-Predicted Post % 65  Pre FEV1/FVC % % 63  Post FEV1/FCV % % 76  FEV1-Pre L 0.80  FEV1-Predicted Pre % 47  FEV1-Post L 1.13  DLCO uncorrected ml/min/mmHg 12.20  DLCO UNC% % 72  DLVA Predicted % 109  TLC L 3.54  TLC % Predicted % 78  RV % Predicted % 98   Moderate obstruction with remarkable bronchodilator response  Echocardiogram:   TTE 2017 normal EF, no DD      Assessment & Plan:   # DOE:  # Moderate persistent asthma: Likely component of persistent asthma now under better control with breztri, but CHF, angina, deconditioning are alternative considerations should she worsen symptomatically. Allergy may be a potential trigger - family has tried to keep cats away from her.   Plan: - increase to breztri 2 puffs twice daily with spacer, reviewed inhaler technique with her and her son - IgE next visit  I spent 32 minutes dedicated to the care of this patient on the date of this encounter to include pre-visit review of records, face-to-face time with the patient discussing conditions above, post visit ordering of testing, clinical documentation with the electronic health record, and communicating necessary findings to members of the patients care team.      Maryjane Hurter, MD [facility] 11/04/2021 4:48 PM

## 2021-11-04 ENCOUNTER — Encounter: Payer: Self-pay | Admitting: Student

## 2021-11-04 ENCOUNTER — Ambulatory Visit (INDEPENDENT_AMBULATORY_CARE_PROVIDER_SITE_OTHER): Payer: Medicare Other | Admitting: Student

## 2021-11-04 ENCOUNTER — Ambulatory Visit: Payer: Medicare Other | Admitting: Student

## 2021-11-04 ENCOUNTER — Other Ambulatory Visit: Payer: Self-pay

## 2021-11-04 VITALS — BP 130/82 | HR 97 | Temp 98.0°F | Ht 60.0 in | Wt 173.0 lb

## 2021-11-04 DIAGNOSIS — J4540 Moderate persistent asthma, uncomplicated: Secondary | ICD-10-CM | POA: Diagnosis not present

## 2021-11-04 MED ORDER — SPACER/AERO-HOLDING CHAMBERS DEVI: 1.0000 | Freq: Two times a day (BID) | 0 refills | Status: AC

## 2021-11-04 NOTE — Patient Instructions (Addendum)
-   Breztri 2 puffs twice daily with spacer, rinse mouth and spacer after each use - See you in 3 months!

## 2021-11-19 ENCOUNTER — Telehealth: Payer: Self-pay | Admitting: Neurology

## 2021-11-19 DIAGNOSIS — R1310 Dysphagia, unspecified: Secondary | ICD-10-CM

## 2021-11-19 DIAGNOSIS — M5412 Radiculopathy, cervical region: Secondary | ICD-10-CM

## 2021-11-19 DIAGNOSIS — R269 Unspecified abnormalities of gait and mobility: Secondary | ICD-10-CM

## 2021-11-19 DIAGNOSIS — R4789 Other speech disturbances: Secondary | ICD-10-CM

## 2021-11-19 MED ORDER — ALPRAZOLAM 0.5 MG PO TABS: ORAL_TABLET | ORAL | 0 refills | Status: DC

## 2021-11-19 NOTE — Telephone Encounter (Signed)
Pt's daughter called requesting medication for claustrophobia. Has an MRI on tomorrow. [facility] [phone] - [place], [place] - [address] AT [place]

## 2021-11-19 NOTE — Telephone Encounter (Signed)
LVM for daughter letting her know medication called in. Relayed directions and advised pt should not drive. Must have driver to and from test.

## 2021-11-19 NOTE — Telephone Encounter (Signed)
Meds ordered this encounter  Medications   ALPRAZolam (XANAX) 0.5 MG tablet    Sig: Take 1-2 tablets by mouth 30-60 min prior to MRI. Can take additional tablet at time of test if needed. Must have driver to and from test, can cause drowsiness.    Dispense:  3 tablet    Refill:  0    Penni Bombard, MD 63/78/5885, 0:27 PM Certified in Neurology, Neurophysiology and Neuroimaging  [facility] [address] [place], [place] [address] [phone]

## 2021-11-20 ENCOUNTER — Ambulatory Visit: Payer: Medicare Other

## 2021-11-20 ENCOUNTER — Other Ambulatory Visit: Payer: Self-pay

## 2021-11-20 ENCOUNTER — Ambulatory Visit
Admission: RE | Admit: 2021-11-20 | Discharge: 2021-11-20 | Disposition: A | Discharge status: Home / Self Care | Payer: Medicare Other | Source: Ambulatory Visit | Attending: Neurology | Admitting: Neurology

## 2021-11-20 DIAGNOSIS — R269 Unspecified abnormalities of gait and mobility: Secondary | ICD-10-CM

## 2021-11-20 DIAGNOSIS — M5412 Radiculopathy, cervical region: Secondary | ICD-10-CM | POA: Diagnosis not present

## 2021-12-02 NOTE — Telephone Encounter (Signed)
Pt's daughter called asking about her mother MRI results. Daughter requesting a call back.

## 2021-12-02 NOTE — Telephone Encounter (Signed)
Dr. Felecia Shelling- do you have MRI brain results?   I only see MRI cervical: "Please let her know that the MRI of the cervical spine showed some arthritic and disc changes at C3-C4 and C5-C6, more to the right.  This type of changes could cause neck pain and possibly some pain into the right shoulder and arm but they did not look bad enough to cause any weakness. If she is still having weakness I would like to check an NCV/EMG of the right arm"

## 2021-12-03 ENCOUNTER — Other Ambulatory Visit: Payer: Self-pay | Admitting: Neurology

## 2021-12-03 DIAGNOSIS — R269 Unspecified abnormalities of gait and mobility: Secondary | ICD-10-CM

## 2021-12-03 DIAGNOSIS — M5412 Radiculopathy, cervical region: Secondary | ICD-10-CM

## 2021-12-03 DIAGNOSIS — R1310 Dysphagia, unspecified: Secondary | ICD-10-CM

## 2021-12-03 DIAGNOSIS — R4789 Other speech disturbances: Secondary | ICD-10-CM

## 2021-12-03 NOTE — Telephone Encounter (Signed)
Per Raquel Sarna this am: [facility] should be giving the daughter a call today to get MRI brain scheduled.

## 2021-12-03 NOTE — Telephone Encounter (Signed)
Pt daughter called back. I reviewed the MRI of the neck results and advised that Dr Felecia Shelling would recommend nerve conduction study completed. Advised the patient that our office will be in touch with getting her scheduled to have this completed. Advised the pt that the MRI of the brain appeared to have not been completed. Advised Dr Felecia Shelling would like to make sure that she has this completed. Last time the patient required alprazolam to help with her anxiety. Advised once she is scheduled for that we can certainly call that in.  The daughter in the meantime states that her mom still struggles with neck pain and discomfort and cant hold her head up for long periods of time. She was questioning what can be done for this. I advised that the patient could pursue out pt PT for evaluation and exercises that may be beneficial in helping with strengthening as well as pain. Advised I would discuss with Dr Felecia Shelling to see if he would agree with this.

## 2021-12-03 NOTE — Telephone Encounter (Signed)
LVM for daughter to call office. 

## 2021-12-03 NOTE — Addendum Note (Signed)
Addended by: Darleen Crocker on: 12/03/2021 03:10 PM   Modules accepted: Orders

## 2021-12-11 ENCOUNTER — Other Ambulatory Visit: Payer: Self-pay | Admitting: Neurology

## 2021-12-11 ENCOUNTER — Telehealth: Payer: Self-pay | Admitting: Neurology

## 2021-12-11 MED ORDER — ALPRAZOLAM 0.5 MG PO TABS: ORAL_TABLET | ORAL | 0 refills | Status: DC

## 2021-12-11 NOTE — Telephone Encounter (Signed)
Pt's daughter, Lewayne Bunting (not on [place]) pt need medication to relax her for her MRI on 12/14/21

## 2021-12-11 NOTE — Telephone Encounter (Signed)
Medication has been sent for the patient to [facility] in [place]

## 2021-12-14 ENCOUNTER — Other Ambulatory Visit: Payer: Self-pay

## 2021-12-14 ENCOUNTER — Ambulatory Visit
Admission: RE | Admit: 2021-12-14 | Discharge: 2021-12-14 | Disposition: A | Discharge status: Home / Self Care | Payer: Medicare Other | Source: Ambulatory Visit | Attending: Neurology | Admitting: Neurology

## 2021-12-14 DIAGNOSIS — R269 Unspecified abnormalities of gait and mobility: Secondary | ICD-10-CM

## 2021-12-14 DIAGNOSIS — R1310 Dysphagia, unspecified: Secondary | ICD-10-CM

## 2021-12-14 DIAGNOSIS — R4789 Other speech disturbances: Secondary | ICD-10-CM

## 2021-12-14 MED ORDER — GADOBENATE DIMEGLUMINE 529 MG/ML IV SOLN
15.0000 mL | Freq: Once | INTRAVENOUS | Status: AC | PRN
  Administered 2021-12-14: 15 mL via INTRAVENOUS

## 2021-12-15 ENCOUNTER — Telehealth: Payer: Self-pay | Admitting: *Deleted

## 2021-12-15 NOTE — Telephone Encounter (Signed)
LVM for son, Carmell Austria (on [place]) to call about results.

## 2021-12-15 NOTE — Telephone Encounter (Signed)
-----   Message from Britt Bottom, MD sent at 12/14/2021  5:20 PM EST ----- Results can be closed to the patient's son, Umar.  The MRI shows age-related changes and mild atrophy.  This is typical at age 81.  There was nothing that looked new

## 2021-12-16 ENCOUNTER — Ambulatory Visit: Payer: Medicare Other | Admitting: Physical Therapy

## 2021-12-17 ENCOUNTER — Ambulatory Visit: Payer: Medicare Other | Attending: Neurology | Admitting: Physical Therapy

## 2021-12-17 ENCOUNTER — Encounter: Payer: Self-pay | Admitting: Physical Therapy

## 2021-12-17 ENCOUNTER — Other Ambulatory Visit: Payer: Self-pay

## 2021-12-17 DIAGNOSIS — R2681 Unsteadiness on feet: Secondary | ICD-10-CM | POA: Diagnosis present

## 2021-12-17 DIAGNOSIS — R269 Unspecified abnormalities of gait and mobility: Secondary | ICD-10-CM | POA: Diagnosis not present

## 2021-12-17 DIAGNOSIS — R4789 Other speech disturbances: Secondary | ICD-10-CM | POA: Insufficient documentation

## 2021-12-17 DIAGNOSIS — M542 Cervicalgia: Secondary | ICD-10-CM | POA: Insufficient documentation

## 2021-12-17 DIAGNOSIS — M5412 Radiculopathy, cervical region: Secondary | ICD-10-CM | POA: Diagnosis not present

## 2021-12-17 DIAGNOSIS — M6281 Muscle weakness (generalized): Secondary | ICD-10-CM | POA: Insufficient documentation

## 2021-12-17 DIAGNOSIS — R252 Cramp and spasm: Secondary | ICD-10-CM | POA: Insufficient documentation

## 2021-12-17 DIAGNOSIS — R1310 Dysphagia, unspecified: Secondary | ICD-10-CM | POA: Diagnosis not present

## 2021-12-17 NOTE — Patient Instructions (Signed)
Access Code: [email or web address] URL: [email or web address] Date: 12/17/2021 Prepared by: Almyra Free  Exercises Seated Cervical Retraction - 2 x daily - 7 x weekly - 1 sets - 10 reps - 3-5 sec hold Standing Isometric Cervical Extension with Manual Resistance - 2 x daily - 7 x weekly - 1 sets - 10 reps - 10 sec hold Seated Scapular Retraction - 3 x daily - 7 x weekly - 1 sets - 10 reps - 2-3 sec hold  Patient Education Forward Head Posture

## 2021-12-17 NOTE — Therapy (Signed)
[facility] [address]  [place], [place], [address] Phone: [phone]   Fax:  [phone]  Physical Therapy Evaluation  Patient Details  Name: Rebecca Osborn MRN: [id number] Date of Birth: Feb 22, 1941 Referring Provider (PT): Arlice Colt   Encounter Date: 12/17/2021   PT End of Session - 12/17/21 0846     Visit Number 1    Number of Visits 12    Date for PT Re-Evaluation 01/28/22    Authorization Type MCR/MCD    Progress Note Due on Visit 10    PT Start Time [phone]    PT Stop Time 0930    PT Time Calculation (min) 44 min    Activity Tolerance Patient tolerated treatment well    Behavior During Therapy [facility] for tasks assessed/performed             Past Medical History:  Diagnosis Date   Arthritis    Asthma    Constipation    Hypertension    Thyroid disease     Past Surgical History:  Procedure Laterality Date   APPENDECTOMY     BACK SURGERY     JOINT REPLACEMENT     KNEE ARTHROSCOPY      There were no vitals filed for this visit.    Subjective Assessment - 12/17/21 0848     Subjective Patient reports pain in the right neck and tightness in posterior neck muscles. Also feels tightness between her shoulders as well. When she walks she feels pain in her neck. This has been happening for about 4 months. She needs to put a pillow behind her neck when sitting. She has trouble holding her head up. She uses a SBQC in the community but does not use at home. No falls reported. She does feel unbalanced with gait.    Pertinent History HTN, low back discectomy, knee scope, asthma    Patient Stated Goals to be able to hold her head up and have no pain    Currently in Pain? Yes    Pain Score 8     Pain Location Neck    Pain Orientation Right    Pain Descriptors / Indicators Aching;Tightness    Pain Type Acute pain    Pain Onset More than a month ago    Pain Frequency Constant    Aggravating Factors   walking    Pain Relieving Factors putting pillow behind her head                [facility] PT Assessment - 12/17/21 0001       Assessment   Medical Diagnosis cervicalgia; gait disturbance    Referring Provider (PT) Arlice Colt    Onset Date/Surgical Date 08/17/21    Hand Dominance Right      Precautions   Precautions None      Restrictions   Weight Bearing Restrictions No      Balance Screen   Has the patient fallen in the past 6 months No    Has the patient had a decrease in activity level because of a fear of falling?  No    Is the patient reluctant to leave their home because of a fear of falling?  No      Home Environment   Living Environment Private residence    Home Layout One level    Additional Comments 2 stairs in the garage; no railing but uses cane      Prior Function  Level of Independence Independent    Vocation Retired      Charity fundraiser Status Within Functional Limits for tasks assessed      Posture/Postural Control   Posture/Postural Control Postural limitations    Postural Limitations Rounded Shoulders;Forward head;Increased thoracic kyphosis      ROM / Strength   AROM / PROM / Strength AROM;Strength      AROM   AROM Assessment Site Cervical    Cervical Flexion full    Cervical Extension full    Cervical - Right Side Bend 30    Cervical - Left Side Bend 20    Cervical - Right Rotation 57    Cervical - Left Rotation 50      Strength   Overall Strength Comments cervical strength grossly 4+/5 except extension 3/5      Palpation   Spinal mobility WFL    Palpation comment painful in Rt suboccipitals, bil UT and Levator      Special Tests   Other special tests relief with distraction in sitting and supine; no radicular sx with compression but uncomfortable      Transfers   Five time sit to stand comments  27 sec with UE support      Ambulation/Gait   Ambulation/Gait Yes    Ambulation/Gait Assistance 6: Modified  independent (Device/Increase time)    Ambulation Distance (Feet) 40 Feet    Assistive device Small based quad cane    Gait Pattern Within Functional Limits    Ambulation Surface Level    Gait velocity WNL    Gait Comments patient  does not use SBQC at home      Standardized Balance Assessment   Standardized Balance Assessment Berg Balance Test      Berg Balance Test   Sit to Stand Able to stand  independently using hands    Standing Unsupported Able to stand safely 2 minutes    Sitting with Back Unsupported but Feet Supported on Floor or Stool Able to sit safely and securely 2 minutes    Stand to Sit Sits safely with minimal use of hands    Transfers Able to transfer safely, minor use of hands    Standing Unsupported with Eyes Closed Able to stand 10 seconds safely    Standing Unsupported with Feet Together Able to place feet together independently and stand for 1 minute with supervision    Standing Unsupported, Alternately Place Feet on Step/Stool Able to stand independently and complete 8 steps >20 seconds    Standing Unsupported, One Foot in Front Able to plae foot ahead of the other independently and hold 30 seconds    Standing on One Leg Unable to try or needs assist to prevent fall    Berg comment: to be completed next visit                        Objective measurements completed on examination: See above findings.                PT Education - 12/17/21 2002     Education Details HEP via interpreter; education on posture    Person(s) Educated Patient    Methods Explanation;Demonstration;Verbal cues;Tactile cues;Handout    Comprehension Verbalized understanding;Returned demonstration              PT Short Term Goals - 12/17/21 2015       PT SHORT TERM GOAL #1   Title Ind with initial HEP  Time 2    Period Weeks    Status New    Target Date 12/31/21      PT SHORT TERM GOAL #2   Title BERG and TUG completed and goals set if indicated     Time 1    Period Weeks    Status New    Target Date 12/26/21               PT Long Term Goals - 12/17/21 2016       PT LONG TERM GOAL #1   Title Ind with advanced HEP    Time 6    Period Weeks    Status New    Target Date 01/28/22      PT LONG TERM GOAL #2   Title Patient able to stand and walk with her head in neutral (demonstrating improved postural and neck strength) and with 2/10 pain or less.    Time 6    Period Weeks    Status New      PT LONG TERM GOAL #3   Title Patient to demonstrate Left cervical SB and rotation equal to the right without increased pain.    Time 6    Period Weeks    Status New      PT LONG TERM GOAL #4   Title Patient to demonstrate improved posture in the clinic in both sitting and standing to prevent further injury    Time 6    Period Weeks    Status New      PT LONG TERM GOAL #5   Title Pt to perform 5x sit to stand in <= 13 sec to decrease risk of falls.    Time 6    Status New                    Plan - 12/17/21 2004     Clinical Impression Statement Patient presents with complaints of right sided neck pain x 4 months with insidious onset. She reports she is unable to hold her head upright and must use a pillow to support it when sitting. Ms. Chancy Milroy has poor posture with a marked forward head and rounded shoulders as well as increased thoracic kyphosis. She is able to stand with erect posture with cueing, but upper back and cervical weakness prevent her from maintaining this posture. She has full ROM in cervical flex/ext, but is limited with lateral flexion and rotation to the left compared to the right. She has pain with these motions on the right side as well. Her pain is affecting her ADLs. Patient reported decreased pain from 8/10 to 5/10 at end of session just with intial HEP.  Ms. Chancy Milroy also reports unsteadiness with gait. She amb in the community with a SBQC, but goes without AD at home. Her 5x sit to stand was 27 sec  using BUE putting her at risk for falls. A BERG Balance Assessment was started and will be completed at her next visit. She will benefit from skilled PT to address these deficits.    Examination-Activity Limitations Locomotion Level;Stand    Examination-Participation Restrictions Community Activity;Meal Prep    Stability/Clinical Decision Making Stable/Uncomplicated    Clinical Decision Making Low    Rehab Potential Excellent    PT Frequency 2x / week    PT Duration 6 weeks    PT Treatment/Interventions ADLs/Self Care Home Management;Cryotherapy;Electrical Stimulation;Moist Heat;Traction;Therapeutic activities;Therapeutic exercise;Balance training;Neuromuscular re-education;Manual techniques;Patient/family education;Dry needling;Spinal Manipulations;Taping    PT Next Visit  Plan Complete BERG and TUG (without AD) and set appropriate goals; manual and/or DN to right subocciptals, bil UT/LS; trial of mechanical traction; postural and strength; balance as indicated    PT Home Exercise Plan [email or web address]    Consulted and Agree with Plan of Care Patient             Patient will benefit from skilled therapeutic intervention in order to improve the following deficits and impairments:  Decreased range of motion, Increased muscle spasms, Decreased activity tolerance, Pain, Decreased balance, Impaired flexibility, Postural dysfunction, Decreased strength  Visit Diagnosis: Cervicalgia  Unsteadiness on feet  Cramp and spasm  Muscle weakness (generalized)     Problem List Patient Active Problem List   Diagnosis Date Noted   Dyspnea 09/22/2021   Asthma 09/22/2021   LOWER LEG, ARTHRITIS, DEGEN./OSTEO 06/25/2008   HIP PAIN 06/25/2008   [place] DEGENERATION 06/25/2008   BURSITIS, HIP 06/25/2008   Madelyn Flavors, PT 12/17/2021, 8:25 PM  [facility] [address]  [place], [place], [address] Phone: [phone]   Fax:   [phone]  Name: JENNAVIEVE ARRICK MRN: [id number] Date of Birth: 1941/06/05

## 2021-12-22 ENCOUNTER — Encounter: Payer: Self-pay | Admitting: Physical Therapy

## 2021-12-22 ENCOUNTER — Ambulatory Visit: Payer: Medicare Other | Admitting: Physical Therapy

## 2021-12-22 ENCOUNTER — Other Ambulatory Visit: Payer: Self-pay

## 2021-12-22 DIAGNOSIS — M6281 Muscle weakness (generalized): Secondary | ICD-10-CM

## 2021-12-22 DIAGNOSIS — M542 Cervicalgia: Secondary | ICD-10-CM | POA: Diagnosis not present

## 2021-12-22 DIAGNOSIS — R2681 Unsteadiness on feet: Secondary | ICD-10-CM

## 2021-12-22 DIAGNOSIS — R252 Cramp and spasm: Secondary | ICD-10-CM

## 2021-12-22 NOTE — Telephone Encounter (Signed)
Called the daughter back. I was able to review the results with the daughter. Advised of the findings. Pt is on the schedule for a ncv/emg. She is hoping to get her worked in sooner. Advised we will continue to monitor if something opens up sooner.

## 2021-12-22 NOTE — Patient Instructions (Addendum)
° ° ° °  Access Code: [email or web address] URL: [email or web address] Date: 12/22/2021 Prepared by: Annie Paras  Exercises Seated Cervical Retraction - 2 x daily - 7 x weekly - 1 sets - 10 reps - 3-5 sec hold Standing Isometric Cervical Extension with Manual Resistance - 2 x daily - 7 x weekly - 1 sets - 10 reps - 10 sec hold Seated Scapular Retraction - 3 x daily - 7 x weekly - 1 sets - 10 reps - 2-3 sec hold Standing Bilateral Low Shoulder Row with Anchored Resistance - 1 x daily - 7 x weekly - 2 sets - 10 reps - 5 sec hold Scapular Retraction with Resistance Advanced - 1 x daily - 7 x weekly - 2 sets - 10 reps - 5 sec hold Shoulder External Rotation and Scapular Retraction with Resistance - 1 x daily - 7 x weekly - 2 sets - 10 reps - 3-5 sec hold  Patient Education Forward Head Posture

## 2021-12-22 NOTE — Telephone Encounter (Signed)
Pt's daughter, Lewayne Bunting would like a call  from the nurse to discuss MRI results.

## 2021-12-22 NOTE — Therapy (Signed)
**Note DeOsbornIdentified via Obfuscation** [facility] [address]  [place], [place], [address] Phone: [phone]   Fax:  [phone]  Physical Therapy Treatment  Patient Details  Name: Rebecca Osborn MRN: [id number] Date of Birth: January 31, 1941 Referring Provider (PT): Arlice Colt   Encounter Date: 12/22/2021   PT End of Session - 12/22/21 1448     Visit Number 2    Number of Visits 12    Date for PT ReOsbornEvaluation 01/28/22    Authorization Type MCR/MCD    Progress Note Due on Visit 10    PT Start Time 1448    PT Stop Time 1532    PT Time Calculation (min) 44 min    Activity Tolerance Patient tolerated treatment well    Behavior During Therapy [facility] for tasks assessed/performed             Past Medical History:  Diagnosis Date   Arthritis    Asthma    Constipation    Hypertension    Thyroid disease     Past Surgical History:  Procedure Laterality Date   APPENDECTOMY     BACK SURGERY     JOINT REPLACEMENT     KNEE ARTHROSCOPY      There were no vitals filed for this visit.   Subjective Assessment - 12/22/21 1501     Subjective Pt reports difficulty holding her head up straight for too long and has to physically use her hands to lift her head back after leaning over to do something such as wash her face.    Pertinent History HTN, low back discectomy, knee scope, asthma    Patient Stated Goals to be able to hold her head up and have no pain    Currently in Pain? Yes    Pain Score 8    7Osborn8/10   Pain Location Neck    Pain Orientation Right   R>L   Pain Descriptors / Indicators Tiring    Pain Type Acute pain    Pain Radiating Towards upper back    Pain Frequency Constant                [facility] PT Assessment - 12/22/21 1448       Standardized Balance Assessment   Standardized Balance Assessment Berg Balance Test;Timed Up and Go Test      Berg Balance Test   Sit to Stand Able to stand  independently using hands    Standing  Unsupported Able to stand safely 2 minutes    Sitting with Back Unsupported but Feet Supported on Floor or Stool Able to sit safely and securely 2 minutes    Stand to Sit Sits safely with minimal use of hands    Transfers Able to transfer safely, minor use of hands    Standing Unsupported with Eyes Closed Able to stand 10 seconds safely    Standing Unsupported with Feet Together Able to place feet together independently and stand for 1 minute with supervision    From Standing, Reach Forward with Outstretched Arm Can reach confidently >25 cm (10")    From Standing Position, Pick up Object from Floor Able to pick up shoe, needs supervision    From Standing Position, Turn to Look Behind Over each Shoulder Turn sideways only but maintains balance    Turn 360 Degrees Able to turn 360 degrees safely but slowly    Standing Unsupported, Alternately Place Feet on Step/Stool Able to stand independently and complete 8 steps >  20 seconds    Standing Unsupported, One Foot in [place] to plae foot ahead of the other independently and hold 30 seconds    Standing on One Leg Unable to try or needs assist to prevent fall    Total Score 43    Berg comment: [id number] significant fall risk (>80%)      Timed Up and Go Test   Normal TUG (seconds) 18.44    TUG Comments >13.5 sec indicates high fall risk                           [facility] Adult PT Treatment/Exercise - 12/22/21 1448       Exercises   Exercises Neck      Neck Exercises: Theraband   Shoulder Extension 10 reps   yellow TB   Shoulder Extension Limitations standing - cues for scap retraction & depression    Rows 10 reps   yellow TB   Rows Limitations standing - cues for scap retraction & depression, avoiding excessive shoulder extension/upward rotation of elbows    Shoulder External Rotation 10 reps   yellow TB   Shoulder External Rotation Limitations seated - cues for scap retraction      Neck Exercises: Seated   Cervical Isometrics  Extension;5 reps;5 secs    Cervical Isometrics Limitations limited tolerance d/t increased pain    Neck Retraction 10 reps;5 secs    Other Seated Exercise Cervical extension SNAG with rolled pillowcase - deferred d/t report of increased pain      Neck Exercises: Supine   Neck Retraction 10 reps;5 secs    Neck Retraction Limitations into pillow    Other Supine Exercise Scap retraction 10 x 5 sec      Neck Exercises: Stretches   Upper Trapezius Stretch Right;Left;1 rep;30 seconds    Levator Stretch Right;Left;1 rep;30 seconds      Knee/Hip Exercises: Aerobic   Recumbent Bike L1 x 1 min      Manual Therapy   Manual Therapy Soft tissue mobilization;Myofascial release;Passive ROM;Manual Traction    Soft tissue mobilization STM/DTM to B suboccipitals, cervical paraspinals, UT and LS    Myofascial Release B suboccipital release    Passive ROM manual B UT & LS stretches    Manual Traction manual cervical distraction 3 x 30 sec                     PT Education - 12/22/21 1923     Education Details HEP update (via interpreter) - Access Code: [email or web address]    Person(s) Educated Patient    Methods Explanation;Demonstration;Verbal cues;Tactile cues;Handout    Comprehension Verbalized understanding;Verbal cues required;Tactile cues required;Returned demonstration;Need further instruction              PT Short Term Goals - 12/22/21 1530       PT SHORT TERM GOAL #1   Title Ind with initial HEP    Status OnOsborngoing    Target Date 12/31/21      PT SHORT TERM GOAL #2   Title BERG and TUG completed and goals set if indicated    Status Achieved               PT Long Term Goals - 12/22/21 1530       PT LONG TERM GOAL #1   Title Ind with advanced HEP    Status OnOsborngoing    Target Date 01/28/22      PT  LONG TERM GOAL #2   Title Patient able to stand and walk with her head in neutral (demonstrating improved postural and neck strength) and with 2/10 pain or less.     Status OnOsborngoing    Target Date 01/28/22      PT LONG TERM GOAL #3   Title Patient to demonstrate Left cervical SB and rotation equal to the right without increased pain.    Status OnOsborngoing    Target Date 01/28/22      PT LONG TERM GOAL #4   Title Patient to demonstrate improved posture in the clinic in both sitting and standing to prevent further injury    Status OnOsborngoing    Target Date 01/28/22      PT LONG TERM GOAL #5   Title Pt to perform 5x sit to stand in </= 13 sec to decrease risk of falls.    Status OnOsborngoing    Target Date 01/28/22      PT LONG TERM GOAL #6   Title Patient will demonstrate decreased TUG time to </= 13.5 sec to decrease risk for falls with transitional mobility    Status New    Target Date 01/28/22      PT LONG TERM GOAL #7   Title Patient will improve Berg score to >/= 50/56 to improve safety and stability with ADLs in standing and reduce risk for falls    Status New    Target Date 01/28/22                   Plan - 12/22/21 1532     Clinical Impression Statement Balance assessment completed with both Berg score of 43/56 and TUG time of 18.44 sec indicating significant/high risk for falls - goals updated and POC to include balance training as cervical pain/issues better controlled. Rebecca Osborn - provided instruction in supine alternative with better tolerance noted. She demonstrated limited tolerance for attempts at UT stretches (although LS stretches better tolerated) but responded positively to manual STM/MFR and stretching as well as manual traction/distraction, however tolerance for supine positioning limited by postOsbornnasal drip and issues with swallowing. Progressed postural strengthening with addition of yellow TB resisted scapular strengthening with good tolerance, therefore  HEP updated accordingly.    Rehab Potential Excellent    PT Frequency 2x / week    PT Duration 6 weeks    PT Treatment/Interventions ADLs/Self Care Home Management;Cryotherapy;Electrical Stimulation;Moist Heat;Traction;Therapeutic activities;Therapeutic exercise;Balance training;Neuromuscular reOsborneducation;Manual techniques;Patient/family education;Dry needling;Spinal Manipulations;Taping    PT Next Visit Plan manual and/or DN to right subocciptals, bil UT/LS; trial of mechanical traction pending tolerance for supine positioning; postural and strength; balance as cervical issues improve    PT Home Exercise Plan [email or web address] (1/18, updated 1/23)    Consulted and Agree with Plan of Care Patient             Patient will benefit from skilled therapeutic intervention in order to improve the following deficits and impairments:  Decreased range of motion, Increased muscle spasms, Decreased activity tolerance, Pain, Decreased balance, Impaired flexibility, Postural dysfunction, Decreased strength  Visit Diagnosis: Cervicalgia  Unsteadiness on feet  Cramp and spasm  Muscle weakness (generalized)     Problem List Patient Active Problem List   Diagnosis Date Noted   Dyspnea 09/22/2021   Asthma 09/22/2021   LOWER LEG, ARTHRITIS, DEGEN./OSTEO 06/25/2008   HIP  PAIN 06/25/2008   [place] DEGENERATION 06/25/2008   BURSITIS, HIP 06/25/2008    Percival Spanish, PT 12/22/2021, 7:45 PM  [facility] [address]  [place], [place], [address] Phone: [phone]   Fax:  [phone]  Name: GEORGIANNE GRITZ MRN: [id number] Date of Birth: 1941/05/05

## 2021-12-23 NOTE — Telephone Encounter (Signed)
Called the daughter and was able to get the patient moved up sooner to Thursday with Dr Jaynee Eagles completing the nerve conduction study. Both providers are ok with this and pt was made aware would be with Dr Jaynee Eagles and was ok with this as well.

## 2021-12-25 ENCOUNTER — Encounter: Payer: Medicare Other | Admitting: Neurology

## 2021-12-30 ENCOUNTER — Ambulatory Visit: Payer: Medicare Other | Admitting: Physical Therapy

## 2021-12-30 ENCOUNTER — Other Ambulatory Visit: Payer: Self-pay

## 2021-12-30 ENCOUNTER — Encounter: Payer: Self-pay | Admitting: Physical Therapy

## 2021-12-30 DIAGNOSIS — R2681 Unsteadiness on feet: Secondary | ICD-10-CM

## 2021-12-30 DIAGNOSIS — M542 Cervicalgia: Secondary | ICD-10-CM | POA: Diagnosis not present

## 2021-12-30 DIAGNOSIS — M6281 Muscle weakness (generalized): Secondary | ICD-10-CM

## 2021-12-30 DIAGNOSIS — R252 Cramp and spasm: Secondary | ICD-10-CM

## 2021-12-30 NOTE — Therapy (Signed)
[facility] [address]  [place], [place], [address] Phone: [phone]   Fax:  [phone]  Physical Therapy Treatment  Patient Details  Name: Rebecca Osborn MRN: [id number] Date of Birth: Aug 03, 1941 Referring Provider (PT): Arlice Colt   Encounter Date: 12/30/2021   PT End of Session - 12/30/21 1454     Visit Number 3    Number of Visits 12    Date for PT Re-Evaluation 01/28/22    Authorization Type MCR/MCD    Progress Note Due on Visit 10    PT Start Time 1454   Pt arrived late   PT Stop Time 1538    PT Time Calculation (min) 44 min    Activity Tolerance Patient tolerated treatment well    Behavior During Therapy [facility] for tasks assessed/performed             Past Medical History:  Diagnosis Date   Arthritis    Asthma    Constipation    Hypertension    Thyroid disease     Past Surgical History:  Procedure Laterality Date   APPENDECTOMY     BACK SURGERY     JOINT REPLACEMENT     KNEE ARTHROSCOPY      There were no vitals filed for this visit.   Subjective Assessment - 12/30/21 1457     Subjective Pt reports pain better than before.    Pertinent History HTN, low back discectomy, knee scope, asthma    Patient Stated Goals to be able to hold her head up and have no pain    Currently in Pain? Yes    Pain Score 5     Pain Location Neck   and upper back   Pain Orientation Right    Pain Descriptors / Indicators Tiring    Pain Type Acute pain                               [facility] Adult PT Treatment/Exercise - 12/30/21 1454       Neck Exercises: Theraband   Shoulder Extension 10 reps   yellow TB   Shoulder Extension Limitations standing - cues for scap retraction & depression    Rows 10 reps   yellow TB   Rows Limitations standing - cues for scap retraction & depression, avoiding excessive shoulder extension/upward rotation of elbows    Shoulder External Rotation 10  reps   yellow TB   Shoulder External Rotation Limitations seated - cues for scap retraction into pool noodle along spine at back of chair, keeping elbows tucked at sides    Horizontal ABduction 10 reps   Yellow TB   Horizontal ABduction Limitations seated - cues for scap retraction into pool noodle along spine at back of chair    Other Theraband Exercises YTB scap retraction + UE diagonals x 10 - seated - cues for scap retraction into pool noodle along spine at back of chair, avoiding lateral trunk lean      Neck Exercises: Seated   Cervical Isometrics Extension;10 reps    Cervical Isometrics Limitations YTB      Neck Exercises: Stretches   Upper Trapezius Stretch Right;Left;2 reps;30 seconds    Levator Stretch Right;Left;2 reps;30 seconds    Other Neck Stretches Low doorway pec stretch 2 x 30 sec      Knee/Hip Exercises: Aerobic   Nustep L3 x 6 min (UE/LE)  Modalities   Modalities Moist Heat      Moist Heat Therapy   Number Minutes Moist Heat 10 Minutes    Moist Heat Location Cervical;Shoulder      Manual Therapy   Manual Therapy Soft tissue mobilization;Myofascial release;Passive ROM    Manual therapy comments seated    Soft tissue mobilization STM/DTM to R>L cervical paraspinals, UT and LS    Myofascial Release manual TPR to R UT; pin and stretch to R UT and LS    Passive ROM gentle overpressure into scap retraction over pool noodle in sitting for pec stretch                       PT Short Term Goals - 12/22/21 1530       PT SHORT TERM GOAL #1   Title Ind with initial HEP    Status On-going    Target Date 12/31/21      PT SHORT TERM GOAL #2   Title BERG and TUG completed and goals set if indicated    Status Achieved               PT Long Term Goals - 12/22/21 1530       PT LONG TERM GOAL #1   Title Ind with advanced HEP    Status On-going    Target Date 01/28/22      PT LONG TERM GOAL #2   Title Patient able to stand and walk with her  head in neutral (demonstrating improved postural and neck strength) and with 2/10 pain or less.    Status On-going    Target Date 01/28/22      PT LONG TERM GOAL #3   Title Patient to demonstrate Left cervical SB and rotation equal to the right without increased pain.    Status On-going    Target Date 01/28/22      PT LONG TERM GOAL #4   Title Patient to demonstrate improved posture in the clinic in both sitting and standing to prevent further injury    Status On-going    Target Date 01/28/22      PT LONG TERM GOAL #5   Title Pt to perform 5x sit to stand in </= 13 sec to decrease risk of falls.    Status On-going    Target Date 01/28/22      PT LONG TERM GOAL #6   Title Patient will demonstrate decreased TUG time to </= 13.5 sec to decrease risk for falls with transitional mobility    Status New    Target Date 01/28/22      PT LONG TERM GOAL #7   Title Patient will improve Berg score to >/= 50/56 to improve safety and stability with ADLs in standing and reduce risk for falls    Status New    Target Date 01/28/22                   Plan - 12/30/21 1500     Clinical Impression Statement Shaneca reports her pain is less today but still has trouble with activities where she needs to look down. She requested to work on exercises in sitting today as she is still having some of the post-nasal drip which made it difficult to lay on her back for exercises last visit. Able to progress postural/scapular strengthening with good tolerance but continues to have increased difficulty with cervical retraction, although she was able to maintain good upright head position  during the majority of todays session. Session concluded MT to address increased neck and upper shoulder muscle tension followed by moist heat to promote further muscle relaxation and pain reduction.    Rehab Potential Excellent    PT Frequency 2x / week    PT Duration 6 weeks    PT Treatment/Interventions ADLs/Self Care  Home Management;Cryotherapy;Electrical Stimulation;Moist Heat;Traction;Therapeutic activities;Therapeutic exercise;Balance training;Neuromuscular re-education;Manual techniques;Patient/family education;Dry needling;Spinal Manipulations;Taping    PT Next Visit Plan manual and/or DN to right subocciptals, bil UT/LS; trial of mechanical traction pending tolerance for supine positioning; postural and strength; balance as cervical issues improve    PT Home Exercise Plan [id number] (1/18, updated 1/23)    Consulted and Agree with Plan of Care Patient             Patient will benefit from skilled therapeutic intervention in order to improve the following deficits and impairments:  Decreased range of motion, Increased muscle spasms, Decreased activity tolerance, Pain, Decreased balance, Impaired flexibility, Postural dysfunction, Decreased strength  Visit Diagnosis: Cervicalgia  Unsteadiness on feet  Cramp and spasm  Muscle weakness (generalized)     Problem List Patient Active Problem List   Diagnosis Date Noted   Dyspnea 09/22/2021   Asthma 09/22/2021   LOWER LEG, ARTHRITIS, DEGEN./OSTEO 06/25/2008   HIP PAIN 06/25/2008   [place] DEGENERATION 06/25/2008   BURSITIS, HIP 06/25/2008    Percival Spanish, PT 12/30/2021, 8:12 PM  [facility] [address]  [place], [place], [address] Phone: [phone]   Fax:  [phone]  Name: DAFNE NIELD MRN: [id number] Date of Birth: 12-03-1940

## 2022-01-05 DIAGNOSIS — Z01818 Encounter for other preprocedural examination: Secondary | ICD-10-CM

## 2022-01-05 NOTE — Telephone Encounter (Signed)
Called the daughter and offered a sooner apt for wed 2/8 at 2 pm with check in of 1:30-1:45 followed by Dr Garth Bigness part at 2:45 pm. She accepted for the pt.

## 2022-01-06 ENCOUNTER — Ambulatory Visit: Payer: Medicare Other | Attending: Neurology | Admitting: Physical Therapy

## 2022-01-06 ENCOUNTER — Encounter: Payer: Self-pay | Admitting: Physical Therapy

## 2022-01-06 ENCOUNTER — Other Ambulatory Visit: Payer: Self-pay

## 2022-01-06 DIAGNOSIS — R252 Cramp and spasm: Secondary | ICD-10-CM | POA: Insufficient documentation

## 2022-01-06 DIAGNOSIS — M542 Cervicalgia: Secondary | ICD-10-CM | POA: Diagnosis not present

## 2022-01-06 DIAGNOSIS — M6281 Muscle weakness (generalized): Secondary | ICD-10-CM | POA: Insufficient documentation

## 2022-01-06 DIAGNOSIS — R2681 Unsteadiness on feet: Secondary | ICD-10-CM | POA: Diagnosis present

## 2022-01-06 NOTE — Therapy (Signed)
[facility] [address]  [place], [place], [address] Phone: [phone]   Fax:  [phone]  Physical Therapy Treatment  Patient Details  Name: Rebecca Osborn MRN: [id number] Date of Birth: 07-18-1941 Referring Provider (PT): Arlice Colt   Encounter Date: 01/06/2022   PT End of Session - 01/06/22 1450     Visit Number 4    Number of Visits 12    Date for PT Re-Evaluation 01/28/22    Authorization Type MCR/MCD    Progress Note Due on Visit 10    PT Start Time 1450   Pt arrived late   PT Stop Time 1539    PT Time Calculation (min) 49 min    Activity Tolerance Patient tolerated treatment well    Behavior During Therapy [facility] for tasks assessed/performed             Past Medical History:  Diagnosis Date   Arthritis    Asthma    Constipation    Hypertension    Thyroid disease     Past Surgical History:  Procedure Laterality Date   APPENDECTOMY     BACK SURGERY     JOINT REPLACEMENT     KNEE ARTHROSCOPY      There were no vitals filed for this visit.   Subjective Assessment - 01/06/22 1454     Subjective Pt reports pain is 50% better. She reports she has been trying to work on her posture and it's getting a little easier.    Pertinent History HTN, low back discectomy, knee scope, asthma    Patient Stated Goals to be able to hold her head up and have no pain    Currently in Pain? Yes    Pain Score 5     Pain Location Neck    Pain Orientation Right;Left    Pain Descriptors / Indicators Tiring    Pain Type Acute pain                               [facility] Adult PT Treatment/Exercise - 01/06/22 1450       Exercises   Exercises Neck      Neck Exercises: Machines for Strengthening   UBE (Upper Arm Bike) L1.0 x 4 min (2' each fwd & back)      Neck Exercises: Theraband   Shoulder Extension 10 reps;Red    Shoulder Extension Limitations standing - cues for scap retraction &  depression with good upright posture    Rows 10 reps;Red    Rows Limitations standing - cues for scap retraction & depression with good upright posture    Shoulder External Rotation 10 reps;Red    Shoulder External Rotation Limitations seated - cues for cervical and scap retraction    Horizontal ABduction 10 reps;Red    Horizontal ABduction Limitations seated - cues for cervical and scap retraction      Neck Exercises: Standing   Wall Push Ups 10 reps    Wall Push Ups Limitations cues for upright head position    Other Standing Exercises Cervical & thoracic extension + scap retraction & low horizontal ABD into 1/2 FR on wall 2 x 10      Neck Exercises: Seated   Other Seated Exercise Thoracic extension over back of chair with hands laced behind head/neck x 10      Neck Exercises: Prone   Neck Retraction 10 reps;3 secs  Neck Retraction Limitations seated with elbows resting on knees      Moist Heat Therapy   Number Minutes Moist Heat 10 Minutes    Moist Heat Location Cervical;Shoulder      Manual Therapy   Manual Therapy Soft tissue mobilization;Myofascial release;Manual Traction    Manual therapy comments hookllying with head of table slightly elevated    Soft tissue mobilization STM/DTM to B cervical paraspinals, scalenes, UT and LS    Myofascial Release B suboccipital release    Passive ROM manual B UT & LS stretches    Manual Traction manual cervical distraction in slight flexion 5 x 30 sec                       PT Short Term Goals - 01/06/22 1456       PT SHORT TERM GOAL #1   Title Ind with initial HEP    Status Achieved   01/06/22     PT SHORT TERM GOAL #2   Title BERG and TUG completed and goals set if indicated    Status Achieved               PT Long Term Goals - 12/22/21 1530       PT LONG TERM GOAL #1   Title Ind with advanced HEP    Status On-going    Target Date 01/28/22      PT LONG TERM GOAL #2   Title Patient able to stand and  walk with her head in neutral (demonstrating improved postural and neck strength) and with 2/10 pain or less.    Status On-going    Target Date 01/28/22      PT LONG TERM GOAL #3   Title Patient to demonstrate Left cervical SB and rotation equal to the right without increased pain.    Status On-going    Target Date 01/28/22      PT LONG TERM GOAL #4   Title Patient to demonstrate improved posture in the clinic in both sitting and standing to prevent further injury    Status On-going    Target Date 01/28/22      PT LONG TERM GOAL #5   Title Pt to perform 5x sit to stand in </= 13 sec to decrease risk of falls.    Status On-going    Target Date 01/28/22      PT LONG TERM GOAL #6   Title Patient will demonstrate decreased TUG time to </= 13.5 sec to decrease risk for falls with transitional mobility    Status New    Target Date 01/28/22      PT LONG TERM GOAL #7   Title Patient will improve Berg score to >/= 50/56 to improve safety and stability with ADLs in standing and reduce risk for falls    Status New    Target Date 01/28/22                   Plan - 01/06/22 1456     Clinical Impression Statement Parnika reports 50% reduction in her pain at this point although pain ratings do not reflect quite as much improvement. She reports better ability to hold her head upright although still requires frequent reminders during therapy session for upright posture and head position. Able to briefly tolerate supine position with head of table slightly elevated for MT including suboccipital release and manual traction but unable to tolerate prolonged positioning on back or in prone  to allow for trial of DN or mechanical traction. She reports yellow TB getting easy with HEP, therefore progressed resistance to red TB with good tolerance. Continued to focus on thoracic and cervical extension/retraction adding modified gravity resisted cervical retraction as well as continued postural muscle  activation and stretching. PT requesting moist heat to neck and upper shoulders to end session.    Rehab Potential Excellent    PT Frequency 2x / week    PT Duration 6 weeks    PT Treatment/Interventions ADLs/Self Care Home Management;Cryotherapy;Electrical Stimulation;Moist Heat;Traction;Therapeutic activities;Therapeutic exercise;Balance training;Neuromuscular re-education;Manual techniques;Patient/family education;Dry needling;Spinal Manipulations;Taping    PT Next Visit Plan manual and/or DN to B subocciptals, bil UT/LS; trial of mechanical traction pending tolerance for supine positioning; postural and strength; balance as cervical issues improve    PT Home Exercise Plan [id number] (1/18, updated 1/23)    Consulted and Agree with Plan of Care Patient             Patient will benefit from skilled therapeutic intervention in order to improve the following deficits and impairments:  Decreased range of motion, Increased muscle spasms, Decreased activity tolerance, Pain, Decreased balance, Impaired flexibility, Postural dysfunction, Decreased strength  Visit Diagnosis: Cervicalgia  Unsteadiness on feet  Cramp and spasm  Muscle weakness (generalized)     Problem List Patient Active Problem List   Diagnosis Date Noted   Dyspnea 09/22/2021   Asthma 09/22/2021   LOWER LEG, ARTHRITIS, DEGEN./OSTEO 06/25/2008   HIP PAIN 06/25/2008   [place] DEGENERATION 06/25/2008   BURSITIS, HIP 06/25/2008    Percival Spanish, PT 01/06/2022, 3:41 PM  [facility] [address]  [place], [place], [address] Phone: [phone]   Fax:  [phone]  Name: TIASHA HELVIE MRN: [id number] Date of Birth: 15-Apr-1941

## 2022-01-07 ENCOUNTER — Encounter (INDEPENDENT_AMBULATORY_CARE_PROVIDER_SITE_OTHER): Payer: Medicare Other | Admitting: Neurology

## 2022-01-07 ENCOUNTER — Encounter: Payer: Self-pay | Admitting: Neurology

## 2022-01-07 ENCOUNTER — Ambulatory Visit (INDEPENDENT_AMBULATORY_CARE_PROVIDER_SITE_OTHER): Payer: Medicare Other | Admitting: Neurology

## 2022-01-07 DIAGNOSIS — R29898 Other symptoms and signs involving the musculoskeletal system: Secondary | ICD-10-CM | POA: Diagnosis not present

## 2022-01-07 DIAGNOSIS — Z0289 Encounter for other administrative examinations: Secondary | ICD-10-CM

## 2022-01-07 DIAGNOSIS — M5412 Radiculopathy, cervical region: Secondary | ICD-10-CM | POA: Diagnosis not present

## 2022-01-07 DIAGNOSIS — R269 Unspecified abnormalities of gait and mobility: Secondary | ICD-10-CM

## 2022-01-07 MED ORDER — ROLLATOR ULTRA-LIGHT MISC: 0 refills | Status: AC

## 2022-01-07 MED ORDER — DULOXETINE HCL 30 MG PO CPEP: 30.0000 mg | ORAL_CAPSULE | Freq: Every day | ORAL | 11 refills | Status: DC

## 2022-01-07 NOTE — Progress Notes (Signed)
Full Name: Mattigan Hartson Gender: Female MRN #: [id number] Date of Birth: 06-Apr-1941    Visit Date: 01/07/2022 14:04 Age: 81 Years Examining Physician: Despina Arias, MD  Referring Physician: Despina Arias, MD    History: Ms. Nevils is a 81 year old woman with right arm weakness.  MRI of the cervical spine showed degenerative changes at C3-C4 and C5-C6 more to the right but no definite nerve root compression.  On exam, strength was 4+/5 in the deltoid and biceps.  Nerve conduction studies: The right median and ulnar motor responses had normal distal latencies, amplitudes and conduction velocities.  The right median antidromic sensory response was minimally slowed across the wrist while the orthodromic response was normal.  The ulnar sensory response was normal.  Electromyography: Needle EMG of selected muscles of the right arm was performed.  Motor unit morphology and recruitment was normal.  There was no abnormal spontaneous activity.  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   Jossalin Chervenak A. Epimenio Foot, MD, PhD, FAAN Certified in Neurology, Clinical Neurophysiology, Sleep Medicine, Pain Medicine and Neuroimaging Director, [facility]  [facility] [address], [address] [place], [place] [address] [phone]  Clinical note: The results of the recent cervical spine MRI and brain MRIs were reviewed.  The cervical spine MRI showed some degenerative changes but no nerve root compression.  The MRI of the brain was normal for age.  She has some difficulty with gait.  A prescription for new quad cane and a rollator style walker was provided. --RAS   Verbal informed consent was obtained from the patient, patient was informed of potential risk of procedure, including bruising, bleeding, hematoma  formation, infection, muscle weakness, muscle pain, numbness, among others.        MNC    Nerve / Sites Muscle Latency Ref. Amplitude Ref. Rel Amp Segments Distance Velocity Ref. Area    ms ms mV mV %  cm m/s m/s mVms  R Median - APB     Wrist APB 3.5 ?4.4 6.9 ?4.0 100 Wrist - APB 7   27.0     Upper arm APB 7.0  6.6  96.4 Upper arm - Wrist 17 49 ?49 29.1  R Ulnar - ADM     Wrist ADM 2.6 ?3.3 7.4 ?6.0 100 Wrist - ADM 7   20.0     B.Elbow ADM 5.4  7.0  94.8 B.Elbow - Wrist 16 57 ?49 19.9     A.Elbow ADM 7.2  6.8  97.7 A.Elbow - B.Elbow 10 55 ?49 19.6         SNC    Nerve / Sites Rec. Site Peak Lat Ref.  Amp Ref. Segments Distance Peak Diff Ref.    ms ms V V  cm ms ms  R Median, Ulnar - Transcarpal comparison     Median Palm Wrist 2.4 ?2.2 61 ?35 Median Palm - Wrist 8       Ulnar Palm Wrist 2.0 ?2.2 14 ?12 Ulnar Palm - Wrist 8          Median Palm - Ulnar Palm  0.3 ?0.4  R Median - Orthodromic (Dig II, Mid palm)     Dig II Wrist 3.2 ?3.4 20 ?10 Dig II - Wrist 13    R Ulnar - Orthodromic, (Dig V, Mid palm)     Dig V Wrist  2.6 ?3.1 9 ?5 Dig V - Wrist 64             F  Wave    Nerve F Lat Ref.   ms ms  R Ulnar - ADM 27.9 ?32.0       EMG Summary Table    Spontaneous MUAP Recruitment  Muscle IA Fib PSW Fasc Other Amp Dur. Poly Pattern  R. Deltoid Normal None None None _______ Normal Normal Normal Normal  R. Triceps brachii Normal None None None _______ Normal Normal Normal Normal  R. Biceps brachii Normal None None None _______ Normal Normal 1+ Normal  R. Extensor digitorum communis Normal None None None _______ Normal Normal Normal Normal  R. First dorsal interosseous Normal None None None _______ Normal Normal Normal Normal  R. Pronator teres Normal None None None _______ Normal Normal Normal Normal

## 2022-01-08 ENCOUNTER — Encounter: Payer: Medicare Other | Admitting: Physical Therapy

## 2022-01-12 ENCOUNTER — Encounter: Payer: Self-pay | Admitting: Physical Therapy

## 2022-01-12 ENCOUNTER — Ambulatory Visit: Payer: Medicare Other | Admitting: Physical Therapy

## 2022-01-12 ENCOUNTER — Other Ambulatory Visit: Payer: Self-pay

## 2022-01-12 DIAGNOSIS — M542 Cervicalgia: Secondary | ICD-10-CM

## 2022-01-12 DIAGNOSIS — M6281 Muscle weakness (generalized): Secondary | ICD-10-CM

## 2022-01-12 DIAGNOSIS — R2681 Unsteadiness on feet: Secondary | ICD-10-CM

## 2022-01-12 DIAGNOSIS — R252 Cramp and spasm: Secondary | ICD-10-CM

## 2022-01-12 NOTE — Therapy (Signed)
[facility] [address]  [place], [place], [address] Phone: [phone]   Fax:  [phone]  Physical Therapy Treatment  Patient Details  Name: Rebecca Osborn MRN: [id number] Date of Birth: January 17, 1941 Referring Provider (PT): Arlice Colt   Encounter Date: 01/12/2022   PT End of Session - 01/12/22 1451     Visit Number 5    Number of Visits 12    Date for PT Re-Evaluation 01/28/22    Authorization Type MCR/MCD    Progress Note Due on Visit 10    PT Start Time 1451   Pt arrived late   PT Stop Time 1536    PT Time Calculation (min) 45 min    Activity Tolerance Patient tolerated treatment well    Behavior During Therapy [facility] for tasks assessed/performed             Past Medical History:  Diagnosis Date   Arthritis    Asthma    Constipation    Hypertension    Thyroid disease     Past Surgical History:  Procedure Laterality Date   APPENDECTOMY     BACK SURGERY     JOINT REPLACEMENT     KNEE ARTHROSCOPY      There were no vitals filed for this visit.   Subjective Assessment - 01/12/22 1457     Subjective Pt denies pain today and states she feels 75% better since start of PT. She is having an easier time holding her head upright. Pt reports she will be having a L TKA on 02/02/22.    Pertinent History HTN, low back discectomy, knee scope, asthma    Patient Stated Goals to be able to hold her head up and have no pain    Currently in Pain? No/denies                [facility] PT Assessment - 01/12/22 1451       Assessment   Medical Diagnosis cervicalgia; gait disturbance    Referring Provider (PT) Arlice Colt    Onset Date/Surgical Date 08/17/21    Next MD Visit 02/11/22      AROM   Cervical - Right Side Bend 37    Cervical - Left Side Bend 35    Cervical - Right Rotation 66    Cervical - Left Rotation 62                           [facility] Adult PT Treatment/Exercise -  01/12/22 1451       Exercises   Exercises Neck      Neck Exercises: Machines for Strengthening   UBE (Upper Arm Bike) L2.0 x 4 min (2' each fwd & back)      Neck Exercises: Theraband   Shoulder External Rotation 10 reps;Red    Shoulder External Rotation Limitations seated - cues for cervical and scap retraction    Horizontal ABduction 10 reps;Red    Horizontal ABduction Limitations seated - cues for cervical and scap retraction    Other Theraband Exercises RTB scap retraction + UE diagonals x 10 - seated - cues for scap retraction, avoiding lateral trunk lean      Neck Exercises: Prone   Axial Exension 10 reps    Axial Extension Limitations seated in fwd lean with elbows resting on knees    Neck Retraction 10 reps;3 secs    Neck Retraction Limitations seated with elbows  resting on knees      Modalities   Modalities Moist Heat      Moist Heat Therapy   Number Minutes Moist Heat 5 Minutes    Moist Heat Location Cervical;Shoulder      Manual Therapy   Manual Therapy Soft tissue mobilization;Myofascial release    Manual therapy comments hookllying with wedge    Soft tissue mobilization STM/DTM to L>R cervical paraspinals, scalenes, UT and LS, and pecs    Myofascial Release manual TPR to L pec major/minor                       PT Short Term Goals - 01/06/22 1456       PT SHORT TERM GOAL #1   Title Ind with initial HEP    Status Achieved   01/06/22     PT SHORT TERM GOAL #2   Title BERG and TUG completed and goals set if indicated    Status Achieved               PT Long Term Goals - 01/12/22 1459       PT LONG TERM GOAL #1   Title Ind with advanced HEP    Status Partially Met   01/12/22 - met for current HEP   Target Date 01/28/22      PT LONG TERM GOAL #2   Title Patient able to stand and walk with her head in neutral (demonstrating improved postural and neck strength) and with 2/10 pain or less.    Status Partially Met   01/12/22 - Pt able to  maintain more upright head posture but still slightly forward head; no pain unless doing a heavy/hard task   Target Date 01/28/22      PT LONG TERM GOAL #3   Title Patient to demonstrate Left cervical SB and rotation equal to the right without increased pain.    Status Partially Met   01/12/22 - B SB and rotation improved with L ROM to R but still mildly limited and painful   Target Date 01/28/22      PT LONG TERM GOAL #4   Title Patient to demonstrate improved posture in the clinic in both sitting and standing to prevent further injury    Status Partially Met    Target Date 01/28/22      PT LONG TERM GOAL #5   Title Pt to perform 5x sit to stand in </= 13 sec to decrease risk of falls.    Status On-going    Target Date 01/28/22      PT LONG TERM GOAL #6   Title Patient will demonstrate decreased TUG time to </= 13.5 sec to decrease risk for falls with transitional mobility    Status On-going    Target Date 01/28/22      PT LONG TERM GOAL #7   Title Patient will improve Berg score to >/= 50/56 to improve safety and stability with ADLs in standing and reduce risk for falls    Status On-going    Target Date 01/28/22                   Plan - 01/12/22 1536     Clinical Impression Statement Rebecca Osborn reports pain continues to improve, now 75% better since eval. Cervical SB and rotation has improved bilaterally but L SB and rotation remain slightly limited as compared to R due to pain and tightness. She is able to maintain upright head  posture more consistently but still tends to demonstrate more forward head and rounded shoulder, L>R, with increased TTP noted in L pecs today. MT focusing mostly on L neck and shoulder, esp pecs, with improved ability to retract shoulder observed by end of session. Rebecca Osborn progressing well toward goals for neck pain but will have limited opportunity to work on balance before the end of her current POC/remaining scheduled visits and will be unable to  recert as she is scheduled to have a L TKA on 02/02/22.    Rehab Potential Excellent    PT Frequency 2x / week    PT Duration 6 weeks    PT Treatment/Interventions ADLs/Self Care Home Management;Cryotherapy;Electrical Stimulation;Moist Heat;Traction;Therapeutic activities;Therapeutic exercise;Balance training;Neuromuscular re-education;Manual techniques;Patient/family education;Dry needling;Spinal Manipulations;Taping    PT Next Visit Plan manual and/or DN to B subocciptals, bil UT/LS; trial of mechanical traction pending tolerance for supine positioning; postural and strength; balance as cervical issues improve - potentially [place] Fall Prevention Program    PT Home Exercise Plan [id number] (1/18, updated 1/23)    Consulted and Agree with Plan of Care Patient             Patient will benefit from skilled therapeutic intervention in order to improve the following deficits and impairments:  Decreased range of motion, Increased muscle spasms, Decreased activity tolerance, Pain, Decreased balance, Impaired flexibility, Postural dysfunction, Decreased strength  Visit Diagnosis: Cervicalgia  Unsteadiness on feet  Cramp and spasm  Muscle weakness (generalized)     Problem List Patient Active Problem List   Diagnosis Date Noted   Dyspnea 09/22/2021   Asthma 09/22/2021   LOWER LEG, ARTHRITIS, DEGEN./OSTEO 06/25/2008   HIP PAIN 06/25/2008   [place] DEGENERATION 06/25/2008   BURSITIS, HIP 06/25/2008    Percival Spanish, PT 01/12/2022, 6:17 PM  [facility] [address]  [place], [place], [address] Phone: [phone]   Fax:  [phone]  Name: Rebecca Osborn MRN: [id number] Date of Birth: 08-05-1941

## 2022-01-14 ENCOUNTER — Ambulatory Visit: Payer: Medicare Other | Admitting: Student

## 2022-01-15 ENCOUNTER — Ambulatory Visit: Payer: Medicare Other | Admitting: Physical Therapy

## 2022-01-19 ENCOUNTER — Ambulatory Visit: Payer: Medicare Other | Admitting: Physical Therapy

## 2022-02-11 ENCOUNTER — Encounter: Payer: Medicare Other | Admitting: Neurology

## 2022-02-26 ENCOUNTER — Encounter: Payer: Medicare Other | Admitting: Neurology

## 2022-03-14 ENCOUNTER — Emergency Department (HOSPITAL_BASED_OUTPATIENT_CLINIC_OR_DEPARTMENT_OTHER): Payer: Medicare Other

## 2022-03-14 ENCOUNTER — Other Ambulatory Visit: Payer: Self-pay

## 2022-03-14 ENCOUNTER — Encounter (HOSPITAL_BASED_OUTPATIENT_CLINIC_OR_DEPARTMENT_OTHER): Payer: Self-pay

## 2022-03-14 ENCOUNTER — Emergency Department (HOSPITAL_BASED_OUTPATIENT_CLINIC_OR_DEPARTMENT_OTHER)
Admission: EM | Admit: 2022-03-14 | Discharge: 2022-03-14 | Disposition: A | Discharge status: Home / Self Care | Payer: Medicare Other | Attending: Emergency Medicine | Admitting: Emergency Medicine

## 2022-03-14 DIAGNOSIS — R1013 Epigastric pain: Secondary | ICD-10-CM | POA: Diagnosis not present

## 2022-03-14 DIAGNOSIS — R531 Weakness: Secondary | ICD-10-CM | POA: Insufficient documentation

## 2022-03-14 DIAGNOSIS — I10 Essential (primary) hypertension: Secondary | ICD-10-CM | POA: Diagnosis not present

## 2022-03-14 DIAGNOSIS — N390 Urinary tract infection, site not specified: Secondary | ICD-10-CM

## 2022-03-14 DIAGNOSIS — R630 Anorexia: Secondary | ICD-10-CM | POA: Diagnosis not present

## 2022-03-14 DIAGNOSIS — Z79899 Other long term (current) drug therapy: Secondary | ICD-10-CM | POA: Insufficient documentation

## 2022-03-14 DIAGNOSIS — R2243 Localized swelling, mass and lump, lower limb, bilateral: Secondary | ICD-10-CM | POA: Diagnosis present

## 2022-03-14 DIAGNOSIS — R634 Abnormal weight loss: Secondary | ICD-10-CM | POA: Diagnosis not present

## 2022-03-14 DIAGNOSIS — R0602 Shortness of breath: Secondary | ICD-10-CM | POA: Diagnosis not present

## 2022-03-14 LAB — URINALYSIS, ROUTINE W REFLEX MICROSCOPIC
Bilirubin Urine: NEGATIVE
Glucose, UA: NEGATIVE mg/dL
Ketones, ur: NEGATIVE mg/dL
Nitrite: NEGATIVE
Protein, ur: NEGATIVE mg/dL
Specific Gravity, Urine: 1.02 (ref 1.005–1.030)
pH: 7 (ref 5.0–8.0)

## 2022-03-14 LAB — CBC WITH DIFFERENTIAL/PLATELET
Abs Immature Granulocytes: 0.01 10*3/uL (ref 0.00–0.07)
Basophils Absolute: 0 10*3/uL (ref 0.0–0.1)
Basophils Relative: 0 %
Eosinophils Absolute: 0.1 10*3/uL (ref 0.0–0.5)
Eosinophils Relative: 1 %
HCT: 40.5 % (ref 36.0–46.0)
Hemoglobin: 13.2 g/dL (ref 12.0–15.0)
Immature Granulocytes: 0 %
Lymphocytes Relative: 26 %
Lymphs Abs: 1.8 10*3/uL (ref 0.7–4.0)
MCH: 28.9 pg (ref 26.0–34.0)
MCHC: 32.6 g/dL (ref 30.0–36.0)
MCV: 88.6 fL (ref 80.0–100.0)
Monocytes Absolute: 0.6 10*3/uL (ref 0.1–1.0)
Monocytes Relative: 9 %
Neutro Abs: 4.3 10*3/uL (ref 1.7–7.7)
Neutrophils Relative %: 64 %
Platelets: 349 10*3/uL (ref 150–400)
RBC: 4.57 MIL/uL (ref 3.87–5.11)
RDW: 15.6 % — ABNORMAL HIGH (ref 11.5–15.5)
WBC: 6.7 10*3/uL (ref 4.0–10.5)
nRBC: 0 % (ref 0.0–0.2)

## 2022-03-14 LAB — COMPREHENSIVE METABOLIC PANEL
ALT: 22 U/L (ref 0–44)
AST: 24 U/L (ref 15–41)
Albumin: 3.9 g/dL (ref 3.5–5.0)
Alkaline Phosphatase: 71 U/L (ref 38–126)
Anion gap: 7 (ref 5–15)
BUN: 15 mg/dL (ref 8–23)
CO2: 30 mmol/L (ref 22–32)
Calcium: 9.2 mg/dL (ref 8.9–10.3)
Chloride: 100 mmol/L (ref 98–111)
Creatinine, Ser: 0.57 mg/dL (ref 0.44–1.00)
GFR, Estimated: >60 mL/min (ref >60)
Glucose, Bld: 93 mg/dL (ref 70–99)
Potassium: 4.1 mmol/L (ref 3.5–5.1)
Sodium: 137 mmol/L (ref 135–145)
Total Bilirubin: 0.4 mg/dL (ref 0.3–1.2)
Total Protein: 7.6 g/dL (ref 6.5–8.1)

## 2022-03-14 LAB — URINALYSIS, MICROSCOPIC (REFLEX)

## 2022-03-14 LAB — TROPONIN I (HIGH SENSITIVITY): Troponin I (High Sensitivity): 3 ng/L (ref <18)

## 2022-03-14 LAB — LIPASE, BLOOD: Lipase: 30 U/L (ref 11–51)

## 2022-03-14 LAB — BRAIN NATRIURETIC PEPTIDE: B Natriuretic Peptide: 10.7 pg/mL (ref 0.0–100.0)

## 2022-03-14 MED ORDER — CEPHALEXIN 500 MG PO CAPS: 500.0000 mg | ORAL_CAPSULE | Freq: Three times a day (TID) | ORAL | 0 refills | Status: DC

## 2022-03-14 MED ORDER — SODIUM CHLORIDE 0.9 % IV SOLN
1.0000 g | Freq: Once | INTRAVENOUS | Status: DC
  Filled 2022-03-14: qty 10

## 2022-03-14 MED ORDER — CEPHALEXIN 250 MG PO CAPS
500.0000 mg | ORAL_CAPSULE | Freq: Once | ORAL | Status: AC
  Administered 2022-03-14: 500 mg via ORAL
  Filled 2022-03-14: qty 2

## 2022-03-14 MED ORDER — IOHEXOL 300 MG/ML  SOLN
100.0000 mL | Freq: Once | INTRAMUSCULAR | Status: AC | PRN
  Administered 2022-03-14: 100 mL via INTRAVENOUS

## 2022-03-14 MED ORDER — SODIUM CHLORIDE 0.9 % IV SOLN: INTRAVENOUS | Status: DC | PRN

## 2022-03-14 NOTE — Discharge Instructions (Addendum)
Take keflex 3 times daily for 5 days ? ?See your doctor for follow-up ? ?Try compression stockings for your leg swelling.  ? ?Return to ER if you have worse abdominal pain, trouble urinating, leg swelling ?

## 2022-03-14 NOTE — ED Notes (Signed)
Pt amb to [facility] w/ minimal assistance  ?

## 2022-03-14 NOTE — ED Triage Notes (Signed)
Pt arrives with son who reports patient has been having some swelling in her feet, also reports some weakness, states she has been under a lot of stress, not eating as much and has lost about 18 lbs, they checked a BP at home it was 110/60 - son states that this is low for her, reports she was c/o pain in her abdomen last night and could not get comfortable or to sleep and she told her son she was having some urinary issues.  ?

## 2022-03-14 NOTE — ED Provider Notes (Signed)
?[facility] ?Provider Note ? ? ?CSN: [id number] ?Arrival date & time: 03/14/22  1548 ? ?  ? ?History ? ?Chief Complaint  ?Patient presents with  ? multiple complaints   ? ? ?Rebecca Osborn is a 81 y.o. female history of hypertension here presenting with multiple complaints.  Patient told her son that she had some leg swelling today.  Also has some subjective shortness of breath.  Patient went to primary care doctor was sent here for further evaluation.  Denies any chest pain today.  She also has ongoing issue of about losing weight and also poor appetite.  She has seen GI multiple times in the past and had unremarkable work-up.  Most recently, she had an MRCP that was unremarkable.  Patient is under a lot of stress.  She lives with her son and apparently there is a lot of issues with daughter in law.  ? ?The history is provided by the patient.  ? ?  ? ?Home Medications ?Prior to Admission medications   ?Medication Sig Start Date End Date Taking? Authorizing Provider  ?albuterol (VENTOLIN HFA) 108 (90 Base) MCG/ACT inhaler Inhale into the lungs as needed. 08/27/21   [provider]  ?ALPRAZolam Duanne Moron) 0.5 MG tablet Take 1-2 tablets by mouth 30-60 min prior to MRI. Can take additional tablet at time of test if needed. Must have driver to and from test, can cause drowsiness. 12/11/21   Sater, Nanine Means, MD  ?amLODipine (NORVASC) 10 MG tablet Take 10 mg by mouth daily.    [provider]  ?Besifloxacin HCl (BESIVANCE) 0.6 % SUSP Place 1 drop into the left eye as directed. 01/27/21   [provider]  ?Bioflavonoid Products (BIOFLEX PO) Take by mouth.    [provider]  ?Bromfenac Sodium (PROLENSA) 0.07 % SOLN Place 1 drop into the left eye as directed. 01/27/21   [provider]  ?Budeson-Glycopyrrol-Formoterol (BREZTRI AEROSPHERE) 160-9-4.8 MCG/ACT AERO Inhale 2 puffs into the lungs in the morning and at bedtime. 09/22/21   Parrett, Fonnie Mu, NP   ?cetirizine (ZYRTEC) 10 MG tablet Take 10 mg by mouth as needed. 07/05/21   [provider]  ?dexlansoprazole (DEXILANT) 60 MG capsule Take 1 capsule by mouth daily. 10/30/21   [provider]  ?DULoxetine (CYMBALTA) 30 MG capsule Take 1 capsule (30 mg total) by mouth daily. 01/07/22   Sater, Nanine Means, MD  ?famotidine (PEPCID) 20 MG tablet Take 20 mg by mouth at bedtime. 05/22/21   [provider]  ?fluticasone (FLONASE) 50 MCG/ACT nasal spray Place 1 spray into both nostrils daily. 06/23/21   [provider]  ?levothyroxine (SYNTHROID, LEVOTHROID) 88 MCG tablet Take 88 mcg by mouth daily.    [provider]  ?Misc. Devices (ROLLATOR ULTRA-LIGHT) MISC Rollator style walker 01/07/22   Sater, Nanine Means, MD  ?montelukast (SINGULAIR) 10 MG tablet Take 10 mg by mouth as needed. 10/27/21   [provider]  ?Multiple Vitamins-Minerals (MULTIVITAMIN WITH MINERALS) tablet Take 1 tablet by mouth daily.    [provider]  ?oxyCODONE-acetaminophen (PERCOCET/ROXICET) 5-325 MG tablet Take 0.5 tablets by mouth daily. 10/13/21   [provider]  ?rOPINIRole (REQUIP) 0.5 MG tablet Take 0.5 mg by mouth as needed. 10/27/21   [provider]  ?sodium fluoride (FLUORISHIELD) 1.1 % GEL dental gel BRUSH 1 TIME A DAY IN THE MORNING ?Patient not taking: Reported on 12/17/2021 12/31/20   [provider]  ?Spacer/Aero-Holding Chambers DEVI 1 each by Does  not apply route in the morning and at bedtime. Please use with MDI inhalers 11/04/21   Maryjane Hurter, MD  ?sucralfate (CARAFATE) 1 g tablet Take 1 g by mouth 4 (four) times daily. 09/05/21   [provider]  ?TRULANCE 3 MG TABS Take 1 tablet by mouth daily. 10/14/21   [provider]  ?Vitamin D, Ergocalciferol, (DRISDOL) 1.25 MG (50000 UNIT) CAPS capsule Take 50,000 Units by mouth once a week. 10/27/21   [provider]  ?   ? ?Allergies    ?Nsaids and Penicillins   ? ?Review of  Systems   ?Review of Systems  ?Constitutional:  Positive for appetite change.  ?Cardiovascular:  Positive for leg swelling.  ?Gastrointestinal:  Positive for abdominal pain.  ?All other systems reviewed and are negative. ? ?Physical Exam ?Updated Vital Signs ?BP 124/77 (BP Location: Right Arm)   Pulse 88   Temp 97.8 ?F (36.6 ?C) (Oral)   Resp 19   Ht '5\' 1"'$  (1.549 m)   Wt 78.8 kg   SpO2 92%   BMI 32.82 kg/m?  ?Physical Exam ?Vitals and nursing note reviewed.  ?Constitutional:   ?   Comments: Chronically ill but not acutely ill  ?HENT:  ?   Nose: Nose normal.  ?   Mouth/Throat:  ?   Mouth: Mucous membranes are moist.  ?Eyes:  ?   Extraocular Movements: Extraocular movements intact.  ?   Pupils: Pupils are equal, round, and reactive to light.  ?Cardiovascular:  ?   Rate and Rhythm: Normal rate and regular rhythm.  ?   Pulses: Normal pulses.  ?   Heart sounds: Normal heart sounds.  ?Pulmonary:  ?   Effort: Pulmonary effort is normal.  ?   Breath sounds: Normal breath sounds.  ?Abdominal:  ?   General: Abdomen is flat.  ?   Palpations: Abdomen is soft.  ?   Comments: Mild epigastric tenderness  ?Musculoskeletal:  ?   Cervical back: Normal range of motion and neck supple.  ?   Comments: Nonpitting edema bilateral legs  ?Skin: ?   General: Skin is warm.  ?   Capillary Refill: Capillary refill takes less than 2 seconds.  ?Neurological:  ?   General: No focal deficit present.  ?   Mental Status: She is oriented to person, place, and time.  ?Psychiatric:     ?   Mood and Affect: Mood normal.     ?   Behavior: Behavior normal.  ? ? ?ED Results / Procedures / Treatments   ?Labs ?(all labs ordered are listed, but only abnormal results are displayed) ?Labs Reviewed  ?CBC WITH DIFFERENTIAL/PLATELET - Abnormal; Notable for the following components:  ?    Result Value  ? RDW 15.6 (*)   ? All other components within normal limits  ?URINALYSIS, ROUTINE W REFLEX MICROSCOPIC - Abnormal; Notable for the following components:  ?  Hgb urine dipstick TRACE (*)   ? Leukocytes,Ua TRACE (*)   ? All other components within normal limits  ?URINALYSIS, MICROSCOPIC (REFLEX) - Abnormal; Notable for the following components:  ? Bacteria, UA MANY (*)   ? All other components within normal limits  ?COMPREHENSIVE METABOLIC PANEL  ?BRAIN NATRIURETIC PEPTIDE  ?LIPASE, BLOOD  ?TROPONIN I (HIGH SENSITIVITY)  ? ? ?EKG ?EKG Interpretation ? ?Date/Time:  Saturday March 14 2022 15:55:13 EDT ?Ventricular Rate:  86 ?PR Interval:  170 ?QRS Duration: 80 ?QT Interval:  348 ?QTC Calculation: 416 ?R Axis:   -4 ?Text  Interpretation: Normal sinus rhythm Minimal voltage criteria for LVH, may be normal variant ( R in aVL ) Borderline ECG When compared with ECG of 04-May-2020 15:57, PREVIOUS ECG IS PRESENT Confirmed by Wandra Arthurs [phone]) on 03/14/2022 5:25:29 PM ? ?Radiology ?DG Chest 2 View ? ?Result Date: 03/14/2022 ?CLINICAL DATA:  Weakness.  Lower extremity swelling. EXAM: CHEST - 2 VIEW COMPARISON:  Chest x-ray dated January 05, 2022. FINDINGS: The heart size and mediastinal contours are within normal limits. Normal pulmonary vascularity. Persistent low lung volumes with unchanged mild bibasilar linear scarring/atelectasis. No focal consolidation, pleural effusion, or pneumothorax. No acute osseous abnormality. IMPRESSION: 1. No acute cardiopulmonary disease. Electronically Signed   By: Titus Dubin M.D.   On: 03/14/2022 16:22  ? ?CT ABDOMEN PELVIS W CONTRAST ? ?Result Date: 03/14/2022 ?CLINICAL DATA:  Lower extremity swelling, weakness, anorexia, weight loss, abdominal pain EXAM: CT ABDOMEN AND PELVIS WITH CONTRAST TECHNIQUE: Multidetector CT imaging of the abdomen and pelvis was performed using the standard protocol following bolus administration of intravenous contrast. RADIATION DOSE REDUCTION: This exam was performed according to the departmental dose-optimization program which includes automated exposure control, adjustment of the mA and/or kV according to  patient size and/or use of iterative reconstruction technique. CONTRAST:  142m OMNIPAQUE IOHEXOL 300 MG/ML  SOLN COMPARISON:  06/15/2019, 09/26/2021 FINDINGS: Lower chest: No acute pleural or parenchymal lung disease. H

## 2022-03-16 LAB — URINE CULTURE

## 2022-03-26 ENCOUNTER — Ambulatory Visit: Payer: Medicare Other | Admitting: Neurology

## 2022-04-07 ENCOUNTER — Telehealth: Payer: Self-pay | Admitting: Student

## 2022-04-08 NOTE — Telephone Encounter (Signed)
Called and spoke with patient's daughter. She was asking for a refill of the [place]. She stated that the inhaler has not been lasting her for a month. She confirmed that she is only using 2 puffs in the morning and 2 puffs at night. She wants this to be sent to a different pharmacy. RX has been sent.  ? ?Nothing further needed at time of call.  ?

## 2022-04-20 ENCOUNTER — Telehealth: Payer: Self-pay | Admitting: Student

## 2022-04-20 MED ORDER — BREZTRI AEROSPHERE 160-9-4.8 MCG/ACT IN AERO: 2.0000 | INHALATION_SPRAY | Freq: Two times a day (BID) | RESPIRATORY_TRACT | 6 refills | Status: DC

## 2022-04-20 NOTE — Telephone Encounter (Signed)
Rx for Rebecca Osborn has been sent to preferred pharmacy for pt. Attempted to call pt's daughter to let her know this had been done but unable to reach. Left her a detailed message letting her know that the Rx was sent. Nothing further needed.

## 2022-04-21 ENCOUNTER — Ambulatory Visit: Payer: Medicare Other | Admitting: Student

## 2022-04-22 NOTE — Progress Notes (Unsigned)
Synopsis: Referred for dyspnea by Windell Hummingbird, PA-C  Subjective:   PATIENT ID: Rebecca Osborn: female DOB: 1941-08-08, MRN: [id number]  No chief complaint on file.    80yF with history of asthma, referred for dyspnea. Vaccinated for coivd and pneumonia. Never had covid infection.   She has substernal pain, worse with deep breaths over last 6 months. Also burning in nature. Worse at night but she is unsure if there is positional component.   Had an EGD in January with [facility] in [place], read as some gastritis by their report. She has been on dexilant since then.   She thinks she had an echo done with [facility] but has not had stress test.  She got course of steroids for her neck in September and she thinks her breathing was improved somewhat.  She says she had asthma in 22s. She was on an inhaler but doesn't recall which. Thinks it was probably albuterol inhaler. They later told me during the visit that she is on trelegy and singulair.   There is a lot of asthma in the family.   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 She has 2 cats. No pet bird. No recent travel outside of [place].   Interval HPI: Seen by ENT with [facility] started on atarax for throat drainage, PND as well, continued ppi.  They have moved cats downstairs, has also started breztri 2 puffs daily, rinses mouth after use. No courses of prednisone or ABX since last visit. Feeling better overall, less dyspnea, cough.   Otherwise pertinent review of systems is negative.    Past Medical History:  Diagnosis Date   Arthritis    Asthma    Constipation    Hypertension    Thyroid disease      Family History  Problem Relation Age of Onset   Heart Problems Father      Past Surgical History:  Procedure Laterality Date   APPENDECTOMY     BACK SURGERY     JOINT REPLACEMENT     KNEE ARTHROSCOPY      Social History   Socioeconomic History   Marital status: Widowed    Spouse name: Not on file   Number of  children: 5   Years of education: Not on file   Highest education level: High school graduate  Occupational History   Not on file  Tobacco Use   Smoking status: Never   Smokeless tobacco: Never  Vaping Use   Vaping Use: Never used  Substance and Sexual Activity   Alcohol use: No   Drug use: No   Sexual activity: Not on file  Other Topics Concern   Not on file  Social History Narrative   Lives with son   Right handed   Caffeine: 1 cup of tea with milk   Social Determinants of Health   Financial Resource Strain: Not on file  Food Insecurity: Not on file  Transportation Needs: Not on file  Physical Activity: Not on file  Stress: Not on file  Social Connections: Not on file  Intimate Partner Violence: Not on file     Allergies  Allergen Reactions   Nsaids     GI upset    Penicillins Hives     Outpatient Medications Prior to Visit  Medication Sig Dispense Refill   albuterol (VENTOLIN HFA) 108 (90 Base) MCG/ACT inhaler Inhale into the lungs as needed.     ALPRAZolam (XANAX) 0.5 MG tablet Take 1-2 tablets by mouth 30-60 min  prior to MRI. Can take additional tablet at time of test if needed. Must have driver to and from test, can cause drowsiness. 3 tablet 0   amLODipine (NORVASC) 10 MG tablet Take 10 mg by mouth daily.     Besifloxacin HCl (BESIVANCE) 0.6 % SUSP Place 1 drop into the left eye as directed.     Bioflavonoid Products (BIOFLEX PO) Take by mouth.     Bromfenac Sodium (PROLENSA) 0.07 % SOLN Place 1 drop into the left eye as directed.     Budeson-Glycopyrrol-Formoterol (BREZTRI AEROSPHERE) 160-9-4.8 MCG/ACT AERO Inhale 2 puffs into the lungs in the morning and at bedtime. 10.7 g 6   cephALEXin (KEFLEX) 500 MG capsule Take 1 capsule (500 mg total) by mouth 3 (three) times daily. 15 capsule 0   cetirizine (ZYRTEC) 10 MG tablet Take 10 mg by mouth as needed.     dexlansoprazole (DEXILANT) 60 MG capsule Take 1 capsule by mouth daily.     DULoxetine (CYMBALTA) 30 MG  capsule Take 1 capsule (30 mg total) by mouth daily. 30 capsule 11   famotidine (PEPCID) 20 MG tablet Take 20 mg by mouth at bedtime.     fluticasone (FLONASE) 50 MCG/ACT nasal spray Place 1 spray into both nostrils daily.     levothyroxine (SYNTHROID, LEVOTHROID) 88 MCG tablet Take 88 mcg by mouth daily.     Misc. Devices (ROLLATOR ULTRA-LIGHT) MISC Rollator style walker 1 each 0   montelukast (SINGULAIR) 10 MG tablet Take 10 mg by mouth as needed.     Multiple Vitamins-Minerals (MULTIVITAMIN WITH MINERALS) tablet Take 1 tablet by mouth daily.     oxyCODONE-acetaminophen (PERCOCET/ROXICET) 5-325 MG tablet Take 0.5 tablets by mouth daily.     rOPINIRole (REQUIP) 0.5 MG tablet Take 0.5 mg by mouth as needed.     sodium fluoride (FLUORISHIELD) 1.1 % GEL dental gel BRUSH 1 TIME A DAY IN THE MORNING (Patient not taking: Reported on 12/17/2021)     Spacer/Aero-Holding Chambers DEVI 1 each by Does not apply route in the morning and at bedtime. Please use with MDI inhalers 1 each 0   sucralfate (CARAFATE) 1 g tablet Take 1 g by mouth 4 (four) times daily.     TRULANCE 3 MG TABS Take 1 tablet by mouth daily.     Vitamin D, Ergocalciferol, (DRISDOL) 1.25 MG (50000 UNIT) CAPS capsule Take 50,000 Units by mouth once a week.     No facility-administered medications prior to visit.       Objective:   Physical Exam:  General appearance: 81 y.o., female, NAD, conversant  Eyes: anicteric sclerae; PERRL, tracking appropriately HENT: NCAT; MMM Neck: Trachea midline; no lymphadenopathy, no JVD Lungs: CTAB, no crackles, no wheeze, with normal respiratory effort CV: RRR, no murmur  Abdomen: Soft, non-tender; non-distended, BS present  Extremities: No peripheral edema, warm Skin: Normal turgor and texture; no rash Psych: Appropriate affect Neuro: Alert and oriented to person and place, no focal deficit     There were no vitals filed for this visit.     on RA BMI Readings from Last 3 Encounters:   03/14/22 32.82 kg/m  11/04/21 33.79 kg/m  11/03/21 33.70 kg/m   Wt Readings from Last 3 Encounters:  03/14/22 173 lb 11.2 oz (78.8 kg)  11/04/21 173 lb (78.5 kg)  11/03/21 174 lb (78.9 kg)     CBC    Component Value Date/Time   WBC 6.7 03/14/2022 1607   RBC 4.57 03/14/2022 1607   HGB  13.2 03/14/2022 1607   HCT 40.5 03/14/2022 1607   PLT 349 03/14/2022 1607   MCV 88.6 03/14/2022 1607   MCH 28.9 03/14/2022 1607   MCHC 32.6 03/14/2022 1607   RDW 15.6 (H) 03/14/2022 1607   LYMPHSABS 1.8 03/14/2022 1607   MONOABS 0.6 03/14/2022 1607   EOSABS 0.1 03/14/2022 1607   BASOSABS 0.0 03/14/2022 1607     Chest Imaging: CTA Chest 10/28 reviewed by me remarkable for only for small lung volumes  CXR 05/04/20 reviewed by me remarkable for decreased lung volumes  CT A/P lung bases 05/2019 reviewed by me and unremarkable  CXR 03/14/22 reviewed by me with low lung volumes  Pulmonary Functions Testing Results:    Latest Ref Rng & Units 09/17/2021    3:47 PM  PFT Results  FVC-Pre L 1.26    FVC-Predicted Pre % 55    FVC-Post L 1.48    FVC-Predicted Post % 65    Pre FEV1/FVC % % 63    Post FEV1/FCV % % 76    FEV1-Pre L 0.80    FEV1-Predicted Pre % 47    FEV1-Post L 1.13    DLCO uncorrected ml/min/mmHg 12.20    DLCO UNC% % 72    DLVA Predicted % 109    TLC L 3.54    TLC % Predicted % 78    RV % Predicted % 98     Moderate obstruction with remarkable bronchodilator response  Echocardiogram:   TTE 2017 normal EF, no DD      Assessment & Plan:   # DOE:  # Moderate persistent asthma: Likely component of persistent asthma now under better control with breztri, but CHF, angina, deconditioning are alternative considerations should she worsen symptomatically. Allergy may be a potential trigger - family has tried to keep cats away from her.   Plan: - increase to breztri 2 puffs twice daily with spacer, reviewed inhaler technique with her and her son - IgE next visit  I  spent 32 minutes dedicated to the care of this patient on the date of this encounter to include pre-visit review of records, face-to-face time with the patient discussing conditions above, post visit ordering of testing, clinical documentation with the electronic health record, and communicating necessary findings to members of the patients care team.      Maryjane Hurter, MD [facility] 04/22/2022 9:00 AM

## 2022-04-23 ENCOUNTER — Ambulatory Visit: Payer: Medicare Other | Admitting: Student

## 2022-04-24 ENCOUNTER — Other Ambulatory Visit: Payer: Self-pay | Admitting: Gastroenterology

## 2022-04-24 DIAGNOSIS — R109 Unspecified abdominal pain: Secondary | ICD-10-CM

## 2022-04-24 DIAGNOSIS — R634 Abnormal weight loss: Secondary | ICD-10-CM

## 2022-04-24 DIAGNOSIS — K559 Vascular disorder of intestine, unspecified: Secondary | ICD-10-CM

## 2022-05-05 NOTE — Progress Notes (Unsigned)
Synopsis: Referred for dyspnea by Windell Hummingbird, PA-C  Subjective:   PATIENT ID: Rebecca Osborn: female DOB: 1941-02-06, MRN: [id number]  No chief complaint on file.    80yF with history of asthma, referred for dyspnea. Vaccinated for coivd and pneumonia. Never had covid infection.   She has substernal pain, worse with deep breaths over last 6 months. Also burning in nature. Worse at night but she is unsure if there is positional component.   Had an EGD in January with [facility] in [place], read as some gastritis by their report. She has been on dexilant since then.   She thinks she had an echo done with [facility] medical but has not had stress test.  She got course of steroids for her neck in September and she thinks her breathing was improved somewhat.  She says she had asthma in 69s. She was on an inhaler but doesn't recall which. Thinks it was probably albuterol inhaler. They later told me during the visit that she is on trelegy and singulair.   There is a lot of asthma in the family.   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 She has 2 cats. No pet bird. No recent travel outside of [place].   Interval HPI: Seen by ENT with [facility] started on atarax for throat drainage, PND as well, continued ppi.  They have moved cats downstairs, has also started breztri 2 puffs daily, rinses mouth after use. No courses of prednisone or ABX since last visit. Feeling better overall, less dyspnea, cough.   Otherwise pertinent review of systems is negative.    Past Medical History:  Diagnosis Date   Arthritis    Asthma    Constipation    Hypertension    Thyroid disease      Family History  Problem Relation Age of Onset   Heart Problems Father      Past Surgical History:  Procedure Laterality Date   APPENDECTOMY     BACK SURGERY     JOINT REPLACEMENT     KNEE ARTHROSCOPY      Social History   Socioeconomic History   Marital status: Widowed    Spouse name: Not on file   Number of  children: 5   Years of education: Not on file   Highest education level: High school graduate  Occupational History   Not on file  Tobacco Use   Smoking status: Never   Smokeless tobacco: Never  Vaping Use   Vaping Use: Never used  Substance and Sexual Activity   Alcohol use: No   Drug use: No   Sexual activity: Not on file  Other Topics Concern   Not on file  Social History Narrative   Lives with son   Right handed   Caffeine: 1 cup of tea with milk   Social Determinants of Health   Financial Resource Strain: Not on file  Food Insecurity: Not on file  Transportation Needs: Not on file  Physical Activity: Not on file  Stress: Not on file  Social Connections: Not on file  Intimate Partner Violence: Not on file     Allergies  Allergen Reactions   Nsaids     GI upset    Penicillins Hives     Outpatient Medications Prior to Visit  Medication Sig Dispense Refill   albuterol (VENTOLIN HFA) 108 (90 Base) MCG/ACT inhaler Inhale into the lungs as needed.     ALPRAZolam (XANAX) 0.5 MG tablet Take 1-2 tablets by mouth 30-60 min  prior to MRI. Can take additional tablet at time of test if needed. Must have driver to and from test, can cause drowsiness. 3 tablet 0   amLODipine (NORVASC) 10 MG tablet Take 10 mg by mouth daily.     Besifloxacin HCl (BESIVANCE) 0.6 % SUSP Place 1 drop into the left eye as directed.     Bioflavonoid Products (BIOFLEX PO) Take by mouth.     Bromfenac Sodium (PROLENSA) 0.07 % SOLN Place 1 drop into the left eye as directed.     Budeson-Glycopyrrol-Formoterol (BREZTRI AEROSPHERE) 160-9-4.8 MCG/ACT AERO Inhale 2 puffs into the lungs in the morning and at bedtime. 10.7 g 6   cephALEXin (KEFLEX) 500 MG capsule Take 1 capsule (500 mg total) by mouth 3 (three) times daily. 15 capsule 0   cetirizine (ZYRTEC) 10 MG tablet Take 10 mg by mouth as needed.     dexlansoprazole (DEXILANT) 60 MG capsule Take 1 capsule by mouth daily.     DULoxetine (CYMBALTA) 30 MG  capsule Take 1 capsule (30 mg total) by mouth daily. 30 capsule 11   famotidine (PEPCID) 20 MG tablet Take 20 mg by mouth at bedtime.     fluticasone (FLONASE) 50 MCG/ACT nasal spray Place 1 spray into both nostrils daily.     levothyroxine (SYNTHROID, LEVOTHROID) 88 MCG tablet Take 88 mcg by mouth daily.     Misc. Devices (ROLLATOR ULTRA-LIGHT) MISC Rollator style walker 1 each 0   montelukast (SINGULAIR) 10 MG tablet Take 10 mg by mouth as needed.     Multiple Vitamins-Minerals (MULTIVITAMIN WITH MINERALS) tablet Take 1 tablet by mouth daily.     oxyCODONE-acetaminophen (PERCOCET/ROXICET) 5-325 MG tablet Take 0.5 tablets by mouth daily.     rOPINIRole (REQUIP) 0.5 MG tablet Take 0.5 mg by mouth as needed.     sodium fluoride (FLUORISHIELD) 1.1 % GEL dental gel BRUSH 1 TIME A DAY IN THE MORNING (Patient not taking: Reported on 12/17/2021)     Spacer/Aero-Holding Chambers DEVI 1 each by Does not apply route in the morning and at bedtime. Please use with MDI inhalers 1 each 0   sucralfate (CARAFATE) 1 g tablet Take 1 g by mouth 4 (four) times daily.     TRULANCE 3 MG TABS Take 1 tablet by mouth daily.     Vitamin D, Ergocalciferol, (DRISDOL) 1.25 MG (50000 UNIT) CAPS capsule Take 50,000 Units by mouth once a week.     No facility-administered medications prior to visit.       Objective:   Physical Exam:  General appearance: 81 y.o., female, NAD, conversant  Eyes: anicteric sclerae; PERRL, tracking appropriately HENT: NCAT; MMM Neck: Trachea midline; no lymphadenopathy, no JVD Lungs: CTAB, no crackles, no wheeze, with normal respiratory effort CV: RRR, no murmur  Abdomen: Soft, non-tender; non-distended, BS present  Extremities: No peripheral edema, warm Skin: Normal turgor and texture; no rash Psych: Appropriate affect Neuro: Alert and oriented to person and place, no focal deficit     There were no vitals filed for this visit.     on RA BMI Readings from Last 3 Encounters:   03/14/22 32.82 kg/m  11/04/21 33.79 kg/m  11/03/21 33.70 kg/m   Wt Readings from Last 3 Encounters:  03/14/22 173 lb 11.2 oz (78.8 kg)  11/04/21 173 lb (78.5 kg)  11/03/21 174 lb (78.9 kg)     CBC    Component Value Date/Time   WBC 6.7 03/14/2022 1607   RBC 4.57 03/14/2022 1607   HGB  13.2 03/14/2022 1607   HCT 40.5 03/14/2022 1607   PLT 349 03/14/2022 1607   MCV 88.6 03/14/2022 1607   MCH 28.9 03/14/2022 1607   MCHC 32.6 03/14/2022 1607   RDW 15.6 (H) 03/14/2022 1607   LYMPHSABS 1.8 03/14/2022 1607   MONOABS 0.6 03/14/2022 1607   EOSABS 0.1 03/14/2022 1607   BASOSABS 0.0 03/14/2022 1607     Chest Imaging:  CTA Chest 10/28 reviewed by me remarkable for only for small lung volumes  CXR 05/04/20 reviewed by me remarkable for decreased lung volumes  CT A/P lung bases 05/2019 reviewed by me and unremarkable  CXR 03/14/22 reviewed by me with low lung volumes  Pulmonary Functions Testing Results:    Latest Ref Rng & Units 09/17/2021    3:47 PM  PFT Results  FVC-Pre L 1.26    FVC-Predicted Pre % 55    FVC-Post L 1.48    FVC-Predicted Post % 65    Pre FEV1/FVC % % 63    Post FEV1/FCV % % 76    FEV1-Pre L 0.80    FEV1-Predicted Pre % 47    FEV1-Post L 1.13    DLCO uncorrected ml/min/mmHg 12.20    DLCO UNC% % 72    DLVA Predicted % 109    TLC L 3.54    TLC % Predicted % 78    RV % Predicted % 98     Moderate obstruction with remarkable bronchodilator response  Echocardiogram:   TTE 2017 normal EF, no DD      Assessment & Plan:   # DOE:  # Moderate persistent asthma: Likely component of persistent asthma now under better control with breztri, but CHF, angina, deconditioning are alternative considerations should she worsen symptomatically. Allergy may be a potential trigger - family has tried to keep cats away from her.   Plan: - increase to breztri 2 puffs twice daily with spacer, reviewed inhaler technique with her and her son - IgE next visit  I  spent 32 minutes dedicated to the care of this patient on the date of this encounter to include pre-visit review of records, face-to-face time with the patient discussing conditions above, post visit ordering of testing, clinical documentation with the electronic health record, and communicating necessary findings to members of the patients care team.      Maryjane Hurter, MD [facility] 05/05/2022 5:19 PM

## 2022-05-06 ENCOUNTER — Ambulatory Visit (INDEPENDENT_AMBULATORY_CARE_PROVIDER_SITE_OTHER): Payer: Medicare Other

## 2022-05-06 ENCOUNTER — Ambulatory Visit (INDEPENDENT_AMBULATORY_CARE_PROVIDER_SITE_OTHER): Payer: Medicare Other | Admitting: Student

## 2022-05-06 VITALS — BP 124/70 | HR 95 | Temp 98.2°F | Ht 60.0 in | Wt 165.0 lb

## 2022-05-06 DIAGNOSIS — J181 Lobar pneumonia, unspecified organism: Secondary | ICD-10-CM

## 2022-05-06 DIAGNOSIS — J4540 Moderate persistent asthma, uncomplicated: Secondary | ICD-10-CM | POA: Diagnosis not present

## 2022-05-06 MED ORDER — SODIUM CHLORIDE 3 % IN NEBU: INHALATION_SOLUTION | RESPIRATORY_TRACT | 12 refills | Status: DC | PRN

## 2022-05-06 NOTE — Patient Instructions (Addendum)
-   finish course of doxycycline, mucinex - Breztri 2 puffs twice daily with spacer, rinse mouth and spacer after each use - hypertonic saline nebulizer twice daily after each breztri treatment - you will be called to schedule PFTs (breathing tests) - See you in 4 weeks!

## 2022-05-07 ENCOUNTER — Telehealth: Payer: Self-pay | Admitting: Student

## 2022-05-07 NOTE — Telephone Encounter (Signed)
I called the pharmacy back and gave them the instructions on how to take the nebulizer solution.

## 2022-05-08 ENCOUNTER — Telehealth: Payer: Self-pay | Admitting: Student

## 2022-05-12 NOTE — Telephone Encounter (Signed)
[facility]C [facility] pharmacy, I was told that insurance will not cover the Albuterol inhaler with the diagnosis code J98.01.  I asked them to try the diagnosis code J45.40. and it would be >$100. I was advised that they would need a PA.  I let them know I would send to our pharmacy team for a PA.  I called and spoke with patient's daughter (DPR) and was advised that [facility] does not have the hypertonic saline solution and were advised that they are not making it any more.  The daughter said that they do have the albuterol inhaler.  Advised I would check with my coworkers to see if they have heard that and then one of [place] would call her back.  She verbalized understanding.

## 2022-05-15 ENCOUNTER — Other Ambulatory Visit: Payer: Medicare Other

## 2022-05-15 NOTE — Telephone Encounter (Signed)
ATC [facility] pharmacy x1 regarding the nebulizer solution (hypertonic nebulizer solution), the number listed goes to customer service not the pharmacy.  I was transferred to the pharmacy, no one picked up and it went back to customer service.  The lady could not provide me with their direct number a the time of my call.  Will try to call another time.

## 2022-05-18 NOTE — Telephone Encounter (Signed)
Called and spoke with patient's daughter who states that [facility] does not have the Hypertonic solution that was ordered they only have albuterol solution. She states that patient already has the albuterol solution. Called [facility] and they confirmed they do not have the hypertonic in stock.   Dr. Verlee Monte please advise on any other recs for the patient.

## 2022-05-25 ENCOUNTER — Inpatient Hospital Stay (HOSPITAL_COMMUNITY)
Admission: EM | Admit: 2022-05-25 | Discharge: 2022-05-29 | DRG: 189 | Disposition: A | Discharge status: Home / Self Care | Payer: Medicare Other | Attending: Internal Medicine | Admitting: Internal Medicine

## 2022-05-25 ENCOUNTER — Other Ambulatory Visit: Payer: Self-pay

## 2022-05-25 ENCOUNTER — Encounter (HOSPITAL_COMMUNITY): Payer: Self-pay

## 2022-05-25 ENCOUNTER — Ambulatory Visit (INDEPENDENT_AMBULATORY_CARE_PROVIDER_SITE_OTHER): Payer: Medicare Other

## 2022-05-25 ENCOUNTER — Ambulatory Visit (INDEPENDENT_AMBULATORY_CARE_PROVIDER_SITE_OTHER): Payer: Medicare Other | Admitting: Student

## 2022-05-25 ENCOUNTER — Encounter: Payer: Self-pay | Admitting: Student

## 2022-05-25 ENCOUNTER — Emergency Department (HOSPITAL_COMMUNITY): Payer: Medicare Other

## 2022-05-25 VITALS — BP 122/72 | HR 93 | Temp 97.8°F | Ht 60.0 in | Wt 167.0 lb

## 2022-05-25 DIAGNOSIS — Z7951 Long term (current) use of inhaled steroids: Secondary | ICD-10-CM

## 2022-05-25 DIAGNOSIS — J9621 Acute and chronic respiratory failure with hypoxia: Secondary | ICD-10-CM | POA: Diagnosis present

## 2022-05-25 DIAGNOSIS — I110 Hypertensive heart disease with heart failure: Secondary | ICD-10-CM | POA: Diagnosis present

## 2022-05-25 DIAGNOSIS — J45909 Unspecified asthma, uncomplicated: Secondary | ICD-10-CM | POA: Diagnosis present

## 2022-05-25 DIAGNOSIS — J986 Disorders of diaphragm: Secondary | ICD-10-CM | POA: Diagnosis present

## 2022-05-25 DIAGNOSIS — R0609 Other forms of dyspnea: Secondary | ICD-10-CM

## 2022-05-25 DIAGNOSIS — R0902 Hypoxemia: Secondary | ICD-10-CM

## 2022-05-25 DIAGNOSIS — J9602 Acute respiratory failure with hypercapnia: Secondary | ICD-10-CM

## 2022-05-25 DIAGNOSIS — Z79890 Hormone replacement therapy (postmenopausal): Secondary | ICD-10-CM | POA: Diagnosis not present

## 2022-05-25 DIAGNOSIS — M47812 Spondylosis without myelopathy or radiculopathy, cervical region: Secondary | ICD-10-CM | POA: Diagnosis present

## 2022-05-25 DIAGNOSIS — Z20822 Contact with and (suspected) exposure to covid-19: Secondary | ICD-10-CM | POA: Diagnosis present

## 2022-05-25 DIAGNOSIS — E8729 Other acidosis: Secondary | ICD-10-CM

## 2022-05-25 DIAGNOSIS — M4802 Spinal stenosis, cervical region: Secondary | ICD-10-CM | POA: Diagnosis present

## 2022-05-25 DIAGNOSIS — R1310 Dysphagia, unspecified: Secondary | ICD-10-CM | POA: Diagnosis present

## 2022-05-25 DIAGNOSIS — R011 Cardiac murmur, unspecified: Secondary | ICD-10-CM

## 2022-05-25 DIAGNOSIS — J9601 Acute respiratory failure with hypoxia: Secondary | ICD-10-CM

## 2022-05-25 DIAGNOSIS — Z886 Allergy status to analgesic agent status: Secondary | ICD-10-CM | POA: Diagnosis not present

## 2022-05-25 DIAGNOSIS — R0600 Dyspnea, unspecified: Secondary | ICD-10-CM | POA: Diagnosis not present

## 2022-05-25 DIAGNOSIS — I5032 Chronic diastolic (congestive) heart failure: Secondary | ICD-10-CM | POA: Diagnosis present

## 2022-05-25 DIAGNOSIS — Z880 Allergy status to penicillin: Secondary | ICD-10-CM

## 2022-05-25 DIAGNOSIS — Z79899 Other long term (current) drug therapy: Secondary | ICD-10-CM | POA: Diagnosis not present

## 2022-05-25 DIAGNOSIS — J9622 Acute and chronic respiratory failure with hypercapnia: Principal | ICD-10-CM | POA: Diagnosis present

## 2022-05-25 DIAGNOSIS — R1312 Dysphagia, oropharyngeal phase: Secondary | ICD-10-CM | POA: Diagnosis present

## 2022-05-25 DIAGNOSIS — E039 Hypothyroidism, unspecified: Secondary | ICD-10-CM | POA: Diagnosis present

## 2022-05-25 DIAGNOSIS — J9691 Respiratory failure, unspecified with hypoxia: Secondary | ICD-10-CM | POA: Diagnosis present

## 2022-05-25 DIAGNOSIS — M1990 Unspecified osteoarthritis, unspecified site: Secondary | ICD-10-CM | POA: Diagnosis present

## 2022-05-25 DIAGNOSIS — R0602 Shortness of breath: Secondary | ICD-10-CM | POA: Diagnosis present

## 2022-05-25 DIAGNOSIS — R0689 Other abnormalities of breathing: Secondary | ICD-10-CM

## 2022-05-25 LAB — URINALYSIS, MICROSCOPIC (REFLEX)

## 2022-05-25 LAB — URINALYSIS, ROUTINE W REFLEX MICROSCOPIC
Bilirubin Urine: NEGATIVE
Glucose, UA: NEGATIVE mg/dL
Ketones, ur: NEGATIVE mg/dL
Leukocytes,Ua: NEGATIVE
Nitrite: NEGATIVE
Protein, ur: NEGATIVE mg/dL
Specific Gravity, Urine: 1.01 (ref 1.005–1.030)
pH: 5.5 (ref 5.0–8.0)

## 2022-05-25 LAB — RAPID URINE DRUG SCREEN, HOSP PERFORMED
Amphetamines: NOT DETECTED
Barbiturates: NOT DETECTED
Benzodiazepines: NOT DETECTED
Cocaine: NOT DETECTED
Opiates: NOT DETECTED
Tetrahydrocannabinol: NOT DETECTED

## 2022-05-25 LAB — I-STAT ARTERIAL BLOOD GAS, ED
Acid-Base Excess: 6 mmol/L — ABNORMAL HIGH (ref 0.0–2.0)
Acid-Base Excess: 8 mmol/L — ABNORMAL HIGH (ref 0.0–2.0)
Bicarbonate: 38.4 mmol/L — ABNORMAL HIGH (ref 20.0–28.0)
Bicarbonate: 37.9 mmol/L — ABNORMAL HIGH (ref 20.0–28.0)
Calcium, Ion: 1.33 mmol/L (ref 1.15–1.40)
Calcium, Ion: 1.32 mmol/L (ref 1.15–1.40)
HCT: 41 % (ref 36.0–46.0)
HCT: 38 % (ref 36.0–46.0)
Hemoglobin: 13.9 g/dL (ref 12.0–15.0)
Hemoglobin: 12.9 g/dL (ref 12.0–15.0)
O2 Saturation: 94 %
O2 Saturation: 94 %
Patient temperature: 98.3
Potassium: 3.8 mmol/L (ref 3.5–5.1)
Potassium: 3.8 mmol/L (ref 3.5–5.1)
Sodium: 136 mmol/L (ref 135–145)
Sodium: 137 mmol/L (ref 135–145)
TCO2: 41 mmol/L — ABNORMAL HIGH (ref 22–32)
TCO2: 41 mmol/L — ABNORMAL HIGH (ref 22–32)
pCO2 arterial: 98.2 mmHg (ref 32–48)
pCO2 arterial: 86.5 mmHg (ref 32–48)
pH, Arterial: 7.199 — CL (ref 7.35–7.45)
pH, Arterial: 7.25 — ABNORMAL LOW (ref 7.35–7.45)
pO2, Arterial: 92 mmHg (ref 83–108)
pO2, Arterial: 88 mmHg (ref 83–108)

## 2022-05-25 LAB — CBC WITH DIFFERENTIAL/PLATELET
Abs Immature Granulocytes: 0.01 10*3/uL (ref 0.00–0.07)
Basophils Absolute: 0 10*3/uL (ref 0.0–0.1)
Basophils Relative: 1 %
Eosinophils Absolute: 0.1 10*3/uL (ref 0.0–0.5)
Eosinophils Relative: 1 %
HCT: 40.9 % (ref 36.0–46.0)
Hemoglobin: 13 g/dL (ref 12.0–15.0)
Immature Granulocytes: 0 %
Lymphocytes Relative: 26 %
Lymphs Abs: 1.6 10*3/uL (ref 0.7–4.0)
MCH: 28.7 pg (ref 26.0–34.0)
MCHC: 31.8 g/dL (ref 30.0–36.0)
MCV: 90.3 fL (ref 80.0–100.0)
Monocytes Absolute: 0.5 10*3/uL (ref 0.1–1.0)
Monocytes Relative: 8 %
Neutro Abs: 4 10*3/uL (ref 1.7–7.7)
Neutrophils Relative %: 64 %
Platelets: 355 10*3/uL (ref 150–400)
RBC: 4.53 MIL/uL (ref 3.87–5.11)
RDW: 15.9 % — ABNORMAL HIGH (ref 11.5–15.5)
WBC: 6.2 10*3/uL (ref 4.0–10.5)
nRBC: 0 % (ref 0.0–0.2)

## 2022-05-25 LAB — COMPREHENSIVE METABOLIC PANEL
ALT: 16 U/L (ref 0–44)
AST: 19 U/L (ref 15–41)
Albumin: 3.7 g/dL (ref 3.5–5.0)
Alkaline Phosphatase: 56 U/L (ref 38–126)
Anion gap: 10 (ref 5–15)
BUN: 14 mg/dL (ref 8–23)
CO2: 30 mmol/L (ref 22–32)
Calcium: 9.3 mg/dL (ref 8.9–10.3)
Chloride: 98 mmol/L (ref 98–111)
Creatinine, Ser: 0.46 mg/dL (ref 0.44–1.00)
GFR, Estimated: >60 mL/min (ref >60)
Glucose, Bld: 92 mg/dL (ref 70–99)
Potassium: 4.1 mmol/L (ref 3.5–5.1)
Sodium: 138 mmol/L (ref 135–145)
Total Bilirubin: 0.3 mg/dL (ref 0.3–1.2)
Total Protein: 6.6 g/dL (ref 6.5–8.1)

## 2022-05-25 LAB — TROPONIN I (HIGH SENSITIVITY)
Troponin I (High Sensitivity): 4 ng/L (ref <18)
Troponin I (High Sensitivity): 3 ng/L (ref <18)

## 2022-05-25 LAB — LIPASE, BLOOD: Lipase: 26 U/L (ref 11–51)

## 2022-05-25 LAB — BRAIN NATRIURETIC PEPTIDE: B Natriuretic Peptide: 22.6 pg/mL (ref 0.0–100.0)

## 2022-05-25 MED ORDER — METHYLPREDNISOLONE SODIUM SUCC 125 MG IJ SOLR
125.0000 mg | Freq: Once | INTRAMUSCULAR | Status: AC
  Administered 2022-05-25: 125 mg via INTRAVENOUS
  Filled 2022-05-25: qty 2

## 2022-05-25 MED ORDER — POLYETHYLENE GLYCOL 3350 17 G PO PACK: 17.0000 g | PACK | Freq: Every day | ORAL | Status: DC | PRN

## 2022-05-25 MED ORDER — DOCUSATE SODIUM 100 MG PO CAPS: 100.0000 mg | ORAL_CAPSULE | Freq: Two times a day (BID) | ORAL | Status: DC | PRN

## 2022-05-25 MED ORDER — LEVOTHYROXINE SODIUM 88 MCG PO TABS
88.0000 ug | ORAL_TABLET | Freq: Every day | ORAL | Status: DC
  Filled 2022-05-25 (×2): qty 1

## 2022-05-25 MED ORDER — ALBUTEROL SULFATE (2.5 MG/3ML) 0.083% IN NEBU: 2.5000 mg | INHALATION_SOLUTION | RESPIRATORY_TRACT | Status: DC | PRN

## 2022-05-25 MED ORDER — ARFORMOTEROL TARTRATE 15 MCG/2ML IN NEBU
15.0000 ug | INHALATION_SOLUTION | Freq: Two times a day (BID) | RESPIRATORY_TRACT | Status: DC
  Administered 2022-05-26 – 2022-05-29 (×7): 15 ug via RESPIRATORY_TRACT
  Filled 2022-05-25 (×8): qty 2

## 2022-05-25 MED ORDER — BUDESONIDE 0.5 MG/2ML IN SUSP
0.5000 mg | Freq: Two times a day (BID) | RESPIRATORY_TRACT | Status: DC
  Administered 2022-05-26 – 2022-05-29 (×7): 0.5 mg via RESPIRATORY_TRACT
  Filled 2022-05-25 (×7): qty 2

## 2022-05-25 MED ORDER — IOHEXOL 350 MG/ML SOLN
59.0000 mL | Freq: Once | INTRAVENOUS | Status: AC | PRN
  Administered 2022-05-25: 59 mL via INTRAVENOUS

## 2022-05-25 MED ORDER — METHYLPREDNISOLONE SODIUM SUCC 125 MG IJ SOLR: 80.0000 mg | INTRAMUSCULAR | Status: DC

## 2022-05-25 MED ORDER — HEPARIN SODIUM (PORCINE) 5000 UNIT/ML IJ SOLN
5000.0000 [IU] | Freq: Three times a day (TID) | INTRAMUSCULAR | Status: DC
Start: 2022-05-25 — End: 2022-05-27
  Administered 2022-05-26 – 2022-05-27 (×4): 5000 [IU] via SUBCUTANEOUS
  Filled 2022-05-25 (×4): qty 1

## 2022-05-25 NOTE — ED Provider Notes (Signed)
[facility] EMERGENCY DEPARTMENT Provider Note   CSN: [id number] Arrival date & time: 05/25/22  1626     History  Chief Complaint  Patient presents with   Abdominal Pain    Rebecca Osborn is a 81 y.o. female.  Patient presents for further work-up for hypoxia and worsening shortness of breath over the past week.  Patient sent over from pulmonology clinic due to worsening hypoxia 83% on room air.  Patient is on home O2.  Patient has had work-up by pulmonology no definitive cause of her lung difficulty.  No known blood clot history, no recent surgeries, no heart failure or heart attack history.  No chest pain.  Shortness of breath with walking.  Minimal ankle swelling bilateral however this is usually associate with her blood pressure medication.  Patient did take antibiotics for possible pneumonia last week.       Home Medications Prior to Admission medications   Medication Sig Start Date End Date Taking? Authorizing Provider  albuterol (VENTOLIN HFA) 108 (90 Base) MCG/ACT inhaler Inhale into the lungs as needed. 08/27/21   [provider]  ALPRAZolam Prudy Feeler) 0.5 MG tablet Take 1-2 tablets by mouth 30-60 min prior to MRI. Can take additional tablet at time of test if needed. Must have driver to and from test, can cause drowsiness. 12/11/21   Sater, Pearletha Furl, MD  amLODipine (NORVASC) 10 MG tablet Take 10 mg by mouth daily.    [provider]  Besifloxacin HCl (BESIVANCE) 0.6 % SUSP Place 1 drop into the left eye as directed. 01/27/21   [provider]  Bioflavonoid Products (BIOFLEX PO) Take by mouth.    [provider]  Bromfenac Sodium (PROLENSA) 0.07 % SOLN Place 1 drop into the left eye as directed. 01/27/21   [provider]  Budeson-Glycopyrrol-Formoterol (BREZTRI AEROSPHERE) 160-9-4.8 MCG/ACT AERO Inhale 2 puffs into the lungs in the morning and at bedtime. 04/20/22   Omar Person, MD  cetirizine (ZYRTEC) 10 MG  tablet Take 10 mg by mouth as needed. 07/05/21   [provider]  dexlansoprazole (DEXILANT) 60 MG capsule Take 1 capsule by mouth daily. 10/30/21   [provider]  DULoxetine (CYMBALTA) 30 MG capsule Take 1 capsule (30 mg total) by mouth daily. 01/07/22   Sater, Pearletha Furl, MD  famotidine (PEPCID) 20 MG tablet Take 20 mg by mouth at bedtime. 05/22/21   [provider]  fluticasone (FLONASE) 50 MCG/ACT nasal spray Place 1 spray into both nostrils daily. 06/23/21   [provider]  levothyroxine (SYNTHROID, LEVOTHROID) 88 MCG tablet Take 88 mcg by mouth daily.    [provider]  Misc. Devices (ROLLATOR ULTRA-LIGHT) MISC Rollator style walker 01/07/22   Sater, Pearletha Furl, MD  montelukast (SINGULAIR) 10 MG tablet Take 10 mg by mouth as needed. 10/27/21   [provider]  Multiple Vitamins-Minerals (MULTIVITAMIN WITH MINERALS) tablet Take 1 tablet by mouth daily.    [provider]  oxyCODONE-acetaminophen (PERCOCET/ROXICET) 5-325 MG tablet Take 0.5 tablets by mouth daily. 10/13/21   [provider]  rOPINIRole (REQUIP) 0.5 MG tablet Take 0.5 mg by mouth as needed. 10/27/21   [provider]  sodium chloride HYPERTONIC 3 % nebulizer solution Take by nebulization as needed for other. Please file under diagnosis asthma J45.909 05/06/22   Omar Person, MD  sodium fluoride (FLUORISHIELD) 1.1 % GEL dental gel  12/31/20   [provider]  Spacer/Aero-Holding Chambers DEVI 1 each by Does not  apply route in the morning and at bedtime. Please use with MDI inhalers 11/04/21   Omar Person, MD  sucralfate (CARAFATE) 1 g tablet Take 1 g by mouth 4 (four) times daily. 09/05/21   [provider]  TRULANCE 3 MG TABS Take 1 tablet by mouth daily. 10/14/21   [provider]  Vitamin D, Ergocalciferol, (DRISDOL) 1.25 MG (50000 UNIT) CAPS capsule Take 50,000 Units by mouth once a week. 10/27/21   [provider]      Allergies    Penicillins and Nsaids    Review of Systems   Review of Systems  Constitutional:  Positive for fatigue. Negative for chills and fever.  HENT:  Negative for congestion.   Eyes:  Negative for visual disturbance.  Respiratory:  Positive for shortness of breath.   Cardiovascular:  Negative for chest pain.  Gastrointestinal:  Negative for abdominal pain and vomiting.  Genitourinary:  Negative for dysuria and flank pain.  Musculoskeletal:  Negative for back pain, neck pain and neck stiffness.  Skin:  Negative for rash.  Neurological:  Negative for light-headedness and headaches.    Physical Exam Updated Vital Signs BP 121/69   Pulse 90   Temp 98.3 F (36.8 C) (Oral)   Resp (!) 22   Ht 5' (1.524 m)   Wt 75.8 kg   SpO2 96%   BMI 32.64 kg/m  Physical Exam Vitals and nursing note reviewed.  Constitutional:      General: She is not in acute distress.    Appearance: She is well-developed.  HENT:     Head: Normocephalic and atraumatic.     Mouth/Throat:     Mouth: Mucous membranes are moist.  Eyes:     General:        Right eye: No discharge.        Left eye: No discharge.     Conjunctiva/sclera: Conjunctivae normal.  Neck:     Trachea: No tracheal deviation.  Cardiovascular:     Rate and Rhythm: Normal rate and regular rhythm.     Heart sounds: Murmur (mild SM upper sternum) heard.  Pulmonary:     Effort: Pulmonary effort is normal.     Breath sounds: Normal breath sounds.  Abdominal:     General: There is no distension.     Palpations: Abdomen is soft.     Tenderness: There is abdominal tenderness in the epigastric area. There is no guarding.  Musculoskeletal:     Cervical back: Normal range of motion and neck supple. No rigidity.  Skin:    General: Skin is warm.     Capillary Refill: Capillary refill takes less than 2 seconds.     Findings: No rash.  Neurological:     General: No focal deficit present.     Mental Status: She is alert.      Cranial Nerves: No cranial nerve deficit.  Psychiatric:        Mood and Affect: Mood normal.     ED Results / Procedures / Treatments   Labs (all labs ordered are listed, but only abnormal results are displayed) Labs Reviewed  CBC WITH DIFFERENTIAL/PLATELET - Abnormal; Notable for the following components:      Result Value   RDW 15.9 (*)    All other components within normal limits  I-STAT ARTERIAL BLOOD GAS, ED - Abnormal; Notable for the following components:   pH, Arterial 7.199 (*)    pCO2 arterial 98.2 (*)    Bicarbonate 38.4 (*)  TCO2 41 (*)    Acid-Base Excess 6.0 (*)    All other components within normal limits  COMPREHENSIVE METABOLIC PANEL  LIPASE, BLOOD  BRAIN NATRIURETIC PEPTIDE  URINALYSIS, ROUTINE W REFLEX MICROSCOPIC  TROPONIN I (HIGH SENSITIVITY)  TROPONIN I (HIGH SENSITIVITY)    EKG EKG Interpretation  Date/Time:  Monday May 25 2022 17:02:49 EDT Ventricular Rate:  82 PR Interval:  162 QRS Duration: 82 QT Interval:  362 QTC Calculation: 422 R Axis:   19 Text Interpretation: Normal sinus rhythm Minimal voltage criteria for LVH, may be normal variant ( R in aVL ) Septal infarct , age undetermined Abnormal ECG When compared with ECG of 14-Mar-2022 15:55, PREVIOUS ECG IS PRESENT Confirmed by Blane Ohara [id number]) on 05/25/2022 7:04:13 PM  Radiology No results found.  Procedures .Critical Care  Performed by: Blane Ohara, MD Authorized by: Blane Ohara, MD   Critical care provider statement:    Critical care time (minutes):  80   Critical care start time:  05/25/2022 7:20 PM   Critical care end time:  05/25/2022 8:40 PM   Critical care time was exclusive of:  Separately billable procedures and treating other patients and teaching time   Critical care was necessary to treat or prevent imminent or life-threatening deterioration of the following conditions:  Respiratory failure   Critical care was time spent personally by me on the following  activities:  Development of treatment plan with patient or surrogate, evaluation of patient's response to treatment, examination of patient, ordering and review of laboratory studies, ordering and review of radiographic studies, ordering and performing treatments and interventions, pulse oximetry, re-evaluation of patient's condition and review of old charts     Medications Ordered in ED Medications - No data to display  ED Course/ Medical Decision Making/ A&P                           Medical Decision Making Amount and/or Complexity of Data Reviewed Labs: ordered. Radiology: ordered.  Risk Decision regarding hospitalization.   Patient presents with worsening dyspnea and hypoxia without definitive cause outpatient.  Patient does have a systolic heart murmur on exam and will need formal echo inpatient.  Patient is stable on 3 L nasal cannula oxygen 97% in the room reviewed cardiac monitor.  EKG reviewed sinus rhythm no acute ST elevation.  Patient had no chest pain.  Patient has had mild epigastric pain for a few months.  Discussed differential with family and patient in the room including heart failure, lung disease, COPDpulmonary embolism, ACS, other.  Initial troponin negative, BNP added on.  CT angiogram to look for pulm embolism or other cause pending.  Reviewed medical records and note/discharge summary from pulmonology who recommended ABG as well.  ABG ordered and reviewed showing respiratory acidosis pH 7.19, PCO2 98.  Plan for BiPAP and plan for admission/pulm consult.  General blood work reviewed no significant leukocytosis, normal hemoglobin, electrolytes unremarkable.  On reassessment using family members to translate and discussed with patient she is not confused or altered but very tired.  Patient is on BiPAP we will continue this to help improve respiratory acidosis.  Discussed with hospitalist for stepdown, recommends ICU overnight to make sure she improves first.  Paged  critical care for admission.  CT scan pending, family members updated on plan.        Final Clinical Impression(s) / ED Diagnoses Final diagnoses:  Hypoxia  Acute dyspnea  Heart murmur  Hypercapnia  Respiratory acidosis    Rx / DC Orders ED Discharge Orders     None         Blane Ohara, MD 05/25/22 2133

## 2022-05-25 NOTE — Progress Notes (Signed)
Pt transported from ED21 to CT and back with no complications.

## 2022-05-26 ENCOUNTER — Inpatient Hospital Stay (HOSPITAL_COMMUNITY): Payer: Medicare Other

## 2022-05-26 DIAGNOSIS — J9602 Acute respiratory failure with hypercapnia: Secondary | ICD-10-CM | POA: Diagnosis not present

## 2022-05-26 DIAGNOSIS — R0609 Other forms of dyspnea: Secondary | ICD-10-CM

## 2022-05-26 DIAGNOSIS — J9601 Acute respiratory failure with hypoxia: Secondary | ICD-10-CM | POA: Diagnosis not present

## 2022-05-26 LAB — RESPIRATORY PANEL BY PCR

## 2022-05-26 LAB — POCT I-STAT 7, (LYTES, BLD GAS, ICA,H+H)
Acid-Base Excess: 6 mmol/L — ABNORMAL HIGH (ref 0.0–2.0)
Acid-Base Excess: 6 mmol/L — ABNORMAL HIGH (ref 0.0–2.0)
Bicarbonate: 36.8 mmol/L — ABNORMAL HIGH (ref 20.0–28.0)
Bicarbonate: 37.1 mmol/L — ABNORMAL HIGH (ref 20.0–28.0)
Calcium, Ion: 1.28 mmol/L (ref 1.15–1.40)
Calcium, Ion: 1.36 mmol/L (ref 1.15–1.40)
HCT: 42 % (ref 36.0–46.0)
HCT: 42 % (ref 36.0–46.0)
Hemoglobin: 14.3 g/dL (ref 12.0–15.0)
Hemoglobin: 14.3 g/dL (ref 12.0–15.0)
O2 Saturation: 95 %
O2 Saturation: 92 %
Patient temperature: 97.9
Potassium: 4.3 mmol/L (ref 3.5–5.1)
Potassium: 4.4 mmol/L (ref 3.5–5.1)
Sodium: 137 mmol/L (ref 135–145)
Sodium: 136 mmol/L (ref 135–145)
TCO2: 39 mmol/L — ABNORMAL HIGH (ref 22–32)
TCO2: 40 mmol/L — ABNORMAL HIGH (ref 22–32)
pCO2 arterial: 82.2 mmHg (ref 32–48)
pCO2 arterial: 87.1 mmHg (ref 32–48)
pH, Arterial: 7.259 — ABNORMAL LOW (ref 7.35–7.45)
pH, Arterial: 7.236 — ABNORMAL LOW (ref 7.35–7.45)
pO2, Arterial: 89 mmHg (ref 83–108)
pO2, Arterial: 77 mmHg — ABNORMAL LOW (ref 83–108)

## 2022-05-26 LAB — PROCALCITONIN: Procalcitonin: <0.1 ng/mL

## 2022-05-26 LAB — BASIC METABOLIC PANEL
Anion gap: 11 (ref 5–15)
BUN: 12 mg/dL (ref 8–23)
CO2: 33 mmol/L — ABNORMAL HIGH (ref 22–32)
Calcium: 9.5 mg/dL (ref 8.9–10.3)
Chloride: 96 mmol/L — ABNORMAL LOW (ref 98–111)
Creatinine, Ser: 0.48 mg/dL (ref 0.44–1.00)
GFR, Estimated: >60 mL/min (ref >60)
Glucose, Bld: 96 mg/dL (ref 70–99)
Potassium: 4 mmol/L (ref 3.5–5.1)
Sodium: 140 mmol/L (ref 135–145)

## 2022-05-26 LAB — GLUCOSE, CAPILLARY
Glucose-Capillary: 127 mg/dL — ABNORMAL HIGH (ref 70–99)
Glucose-Capillary: 129 mg/dL — ABNORMAL HIGH (ref 70–99)
Glucose-Capillary: 142 mg/dL — ABNORMAL HIGH (ref 70–99)
Glucose-Capillary: 151 mg/dL — ABNORMAL HIGH (ref 70–99)
Glucose-Capillary: 171 mg/dL — ABNORMAL HIGH (ref 70–99)
Glucose-Capillary: 97 mg/dL (ref 70–99)

## 2022-05-26 LAB — ECHOCARDIOGRAM COMPLETE
AR max vel: 2.23 cm2
AV Area VTI: 2.47 cm2
AV Area mean vel: 2.29 cm2
AV Mean grad: 9 mmHg
AV Peak grad: 15.7 mmHg
Ao pk vel: 1.98 m/s
Height: 60 in
S' Lateral: 2.1 cm
Weight: 2673.74 oz

## 2022-05-26 LAB — CBC
HCT: 41.3 % (ref 36.0–46.0)
Hemoglobin: 12.5 g/dL (ref 12.0–15.0)
MCH: 27.7 pg (ref 26.0–34.0)
MCHC: 30.3 g/dL (ref 30.0–36.0)
MCV: 91.4 fL (ref 80.0–100.0)
Platelets: 328 10*3/uL (ref 150–400)
RBC: 4.52 MIL/uL (ref 3.87–5.11)
RDW: 15.9 % — ABNORMAL HIGH (ref 11.5–15.5)
WBC: 6 10*3/uL (ref 4.0–10.5)
nRBC: 0 % (ref 0.0–0.2)

## 2022-05-26 LAB — MAGNESIUM: Magnesium: 2 mg/dL (ref 1.7–2.4)

## 2022-05-26 LAB — PHOSPHORUS: Phosphorus: 4.1 mg/dL (ref 2.5–4.6)

## 2022-05-26 LAB — TSH: TSH: 1.755 u[IU]/mL (ref 0.350–4.500)

## 2022-05-26 LAB — MRSA NEXT GEN BY PCR, NASAL: MRSA by PCR Next Gen: NEGATIVE — AB

## 2022-05-26 MED ORDER — DM-GUAIFENESIN ER 30-600 MG PO TB12
1.0000 | ORAL_TABLET | Freq: Two times a day (BID) | ORAL | Status: DC
  Administered 2022-05-26 – 2022-05-29 (×7): 1 via ORAL
  Filled 2022-05-26 (×8): qty 1

## 2022-05-26 MED ORDER — METHYLPREDNISOLONE SODIUM SUCC 40 MG IJ SOLR
40.0000 mg | INTRAMUSCULAR | Status: DC
  Administered 2022-05-26 – 2022-05-27 (×2): 40 mg via INTRAVENOUS
  Filled 2022-05-26 (×2): qty 1

## 2022-05-26 MED ORDER — FAMOTIDINE 20 MG PO TABS: 20.0000 mg | ORAL_TABLET | Freq: Every evening | ORAL | Status: DC | PRN

## 2022-05-26 MED ORDER — AMLODIPINE BESYLATE 10 MG PO TABS
10.0000 mg | ORAL_TABLET | Freq: Every day | ORAL | Status: DC
  Administered 2022-05-26 – 2022-05-29 (×4): 10 mg via ORAL
  Filled 2022-05-26 (×4): qty 1

## 2022-05-26 MED ORDER — PANTOPRAZOLE SODIUM 40 MG IV SOLR
40.0000 mg | Freq: Every day | INTRAVENOUS | Status: DC
  Administered 2022-05-26: 40 mg via INTRAVENOUS
  Filled 2022-05-26: qty 10

## 2022-05-26 MED ORDER — DULOXETINE HCL 30 MG PO CPEP
30.0000 mg | ORAL_CAPSULE | Freq: Every day | ORAL | Status: DC
  Administered 2022-05-26 – 2022-05-29 (×4): 30 mg via ORAL
  Filled 2022-05-26 (×4): qty 1

## 2022-05-26 MED ORDER — METHYLPREDNISOLONE SODIUM SUCC 40 MG IJ SOLR
40.0000 mg | INTRAMUSCULAR | Status: DC
Start: 2022-05-27 — End: 2022-05-26

## 2022-05-26 MED ORDER — CHLORHEXIDINE GLUCONATE CLOTH 2 % EX PADS
6.0000 | MEDICATED_PAD | Freq: Every day | CUTANEOUS | Status: DC
Start: 2022-05-26 — End: 2022-05-27
  Administered 2022-05-26 – 2022-05-27 (×2): 6 via TOPICAL

## 2022-05-26 MED ORDER — LEVOTHYROXINE SODIUM 88 MCG PO TABS
88.0000 ug | ORAL_TABLET | Freq: Every day | ORAL | Status: DC
  Administered 2022-05-26 – 2022-05-29 (×4): 88 ug via ORAL
  Filled 2022-05-26 (×5): qty 1

## 2022-05-26 MED ORDER — MONTELUKAST SODIUM 10 MG PO TABS
10.0000 mg | ORAL_TABLET | Freq: Every day | ORAL | Status: DC
  Administered 2022-05-26 – 2022-05-28 (×3): 10 mg via ORAL
  Filled 2022-05-26 (×3): qty 1

## 2022-05-26 MED ORDER — SUCRALFATE 1 G PO TABS
1.0000 g | ORAL_TABLET | Freq: Four times a day (QID) | ORAL | Status: DC
  Administered 2022-05-26 – 2022-05-29 (×12): 1 g via ORAL
  Filled 2022-05-26 (×16): qty 1

## 2022-05-26 NOTE — Progress Notes (Signed)
RT performed NIF and VC measurements. NIF was -12cmH2O. VC of 0.58L. PT was given an incentive spirometer and instructed on how to use. RT will continue to monitor.

## 2022-05-26 NOTE — Progress Notes (Signed)
  Echocardiogram 2D Echocardiogram has been performed.  Delcie Roch 05/26/2022, 10:01 AM

## 2022-05-26 NOTE — Progress Notes (Signed)
PT transported to 2M12 on bipap with no complications.

## 2022-05-27 ENCOUNTER — Inpatient Hospital Stay (HOSPITAL_COMMUNITY): Payer: Medicare Other

## 2022-05-27 DIAGNOSIS — J9601 Acute respiratory failure with hypoxia: Secondary | ICD-10-CM | POA: Diagnosis not present

## 2022-05-27 DIAGNOSIS — R0689 Other abnormalities of breathing: Secondary | ICD-10-CM

## 2022-05-27 DIAGNOSIS — J9602 Acute respiratory failure with hypercapnia: Secondary | ICD-10-CM | POA: Diagnosis not present

## 2022-05-27 LAB — GLUCOSE, CAPILLARY
Glucose-Capillary: 105 mg/dL — ABNORMAL HIGH (ref 70–99)
Glucose-Capillary: 115 mg/dL — ABNORMAL HIGH (ref 70–99)
Glucose-Capillary: 123 mg/dL — ABNORMAL HIGH (ref 70–99)
Glucose-Capillary: 150 mg/dL — ABNORMAL HIGH (ref 70–99)
Glucose-Capillary: 233 mg/dL — ABNORMAL HIGH (ref 70–99)
Glucose-Capillary: 234 mg/dL — ABNORMAL HIGH (ref 70–99)

## 2022-05-27 LAB — POCT I-STAT 7, (LYTES, BLD GAS, ICA,H+H)
Acid-Base Excess: 10 mmol/L — ABNORMAL HIGH (ref 0.0–2.0)
Bicarbonate: 39.8 mmol/L — ABNORMAL HIGH (ref 20.0–28.0)
Calcium, Ion: 1.34 mmol/L (ref 1.15–1.40)
HCT: 38 % (ref 36.0–46.0)
Hemoglobin: 12.9 g/dL (ref 12.0–15.0)
O2 Saturation: 96 %
Patient temperature: 97.9
Potassium: 4.2 mmol/L (ref 3.5–5.1)
Sodium: 137 mmol/L (ref 135–145)
TCO2: 42 mmol/L — ABNORMAL HIGH (ref 22–32)
pCO2 arterial: 80.1 mmHg (ref 32–48)
pH, Arterial: 7.303 — ABNORMAL LOW (ref 7.35–7.45)
pO2, Arterial: 97 mmHg (ref 83–108)

## 2022-05-27 LAB — BASIC METABOLIC PANEL
Anion gap: 12 (ref 5–15)
BUN: 19 mg/dL (ref 8–23)
CO2: 33 mmol/L — ABNORMAL HIGH (ref 22–32)
Calcium: 9.8 mg/dL (ref 8.9–10.3)
Chloride: 94 mmol/L — ABNORMAL LOW (ref 98–111)
Creatinine, Ser: 0.5 mg/dL (ref 0.44–1.00)
GFR, Estimated: >60 mL/min (ref >60)
Glucose, Bld: 117 mg/dL — ABNORMAL HIGH (ref 70–99)
Potassium: 4.5 mmol/L (ref 3.5–5.1)
Sodium: 139 mmol/L (ref 135–145)

## 2022-05-27 LAB — CBC
HCT: 37.7 % (ref 36.0–46.0)
Hemoglobin: 11.5 g/dL — ABNORMAL LOW (ref 12.0–15.0)
MCH: 27.8 pg (ref 26.0–34.0)
MCHC: 30.5 g/dL (ref 30.0–36.0)
MCV: 91.3 fL (ref 80.0–100.0)
Platelets: 333 10*3/uL (ref 150–400)
RBC: 4.13 MIL/uL (ref 3.87–5.11)
RDW: 15.5 % (ref 11.5–15.5)
WBC: 3.4 10*3/uL — ABNORMAL LOW (ref 4.0–10.5)
nRBC: 0 % (ref 0.0–0.2)

## 2022-05-27 LAB — CK: Total CK: 60 U/L (ref 38–234)

## 2022-05-27 LAB — PROCALCITONIN: Procalcitonin: <0.1 ng/mL

## 2022-05-27 LAB — STREP PNEUMONIAE URINARY ANTIGEN: Strep Pneumo Urinary Antigen: NEGATIVE

## 2022-05-27 MED ORDER — PANTOPRAZOLE SODIUM 40 MG PO TBEC
40.0000 mg | DELAYED_RELEASE_TABLET | Freq: Every day | ORAL | Status: DC
  Administered 2022-05-27 – 2022-05-29 (×3): 40 mg via ORAL
  Filled 2022-05-27 (×3): qty 1

## 2022-05-27 MED ORDER — PANTOPRAZOLE 2 MG/ML SUSPENSION
40.0000 mg | Freq: Every day | ORAL | Status: DC
Start: 2022-05-27 — End: 2022-05-27
  Filled 2022-05-27: qty 20

## 2022-05-27 MED ORDER — RIVAROXABAN 10 MG PO TABS
10.0000 mg | ORAL_TABLET | Freq: Every day | ORAL | Status: DC
  Administered 2022-05-27 – 2022-05-29 (×3): 10 mg via ORAL
  Filled 2022-05-27 (×3): qty 1

## 2022-05-27 NOTE — Evaluation (Signed)
Clinical/Bedside Swallow Evaluation Patient Details  Name: Rebecca Osborn MRN: [id number] Date of Birth: [birth year]  Today's Date: 05/27/2022 Time: SLP Start Time (ACUTE ONLY): 0948 SLP Stop Time (ACUTE ONLY): 1019 SLP Time Calculation (min) (ACUTE ONLY): 31 min  Past Medical History:  Past Medical History:  Diagnosis Date   Arthritis    Asthma    Constipation    Hypertension    Thyroid disease    Past Surgical History:  Past Surgical History:  Procedure Laterality Date   APPENDECTOMY     BACK SURGERY     JOINT REPLACEMENT     KNEE ARTHROSCOPY     HPI:  Rebecca Osborn is a 81 y.o. female who has a PMH as outlined below including but not limited to asthma for which she follows with Dr. Verlee Monte in our clinic.  She presented for routine follow-up 6/26 per Dr. Melvyn Novas notes, her DOE was worse compared to the previous visit to the point she was getting dyspneic with minimal exertion.  She was also experiencing greater orthopnea than before.  Has had ongoing dry cough.  Denies fevers or chills/sweats.  Has had mild pleuritic chest pain centrally located, nonradiating.  Family has also noticed facial edema over past week or so, no obvious LE edema. Apparently her voice is also changed in the past 3 to 4 months getting much weaker.  She has had generalized weakness over the same time.  To where she can barely ambulate without getting dyspneic and O2 levels falling into the low 80s.  Family checks her O2 levels frequently to keep trend and track of things.  Prior to her pulmonary follow-up 6/26, they noted that her O2 levels were as low as 84 on room air. Family states over past 4 months or so, she is quite sedentary, sits on couch most of the day and watches TV. They have noticed that she gets sleepy very easily and often doses off during TV shows or even sometimes during the odd conversation; CXR on 05/26/22 indicated Low volume chest with atelectasis or infiltrate at the bases. Pt placed on Bipap  overnight, but is currently on 4L O2.Family reports difficulty with liquids intermittently and xerostomia. BSE generated.    Assessment / Plan / Recommendation  Clinical Impression  Pt seen for clnical swallowing evaluation with pt exhibiting primary pharyngoesophageal dysphagia characterized by intermittent delayed throat clearing/cough with thin liquids in larger volumes.  Family informed SLP pt "aspirates liquids intermittently" when consuming larger volumes of liquids at home during meals.  Solids consumption appeared [facility] for oral prep/propulsion, timing and adequate oral/pharyngeal clearance.  Pt denies difficulty with transitioning solids during meals.  Pt with pmhx significant for GERD and asthma.  Vocal quality weak/hypophonic and min hoarse during min conversation.   Pt noted xerostomia issues.  OME adequate, but generalized weakness observed.  Pt/family given and educated re: esophageal/aspiration precautions and breathing/swallowing reciprocity during PO consumption.  Regular/thin liquid diet initiation recommended with Halal preference.  Suggested potential MBS to determine swallow function d/t reported s/s of potential aspiration/dysphagic symptoms either in acute setting and/or as an OP when discharged depending on family preference.  Family in agreement with plan.  ST will f/u for diet tolerance/eduation while in acute setting.  Thank you for this consult. SLP Visit Diagnosis: Dysphagia, pharyngoesophageal phase (R13.14)    Aspiration Risk  Mild aspiration risk    Diet Recommendation   Regular/thin liquids (Halal preference)  Medication Administration: Whole meds with puree    Other  Recommendations Oral Care Recommendations: Oral care BID;Staff/trained caregiver to provide oral care    Recommendations for follow up therapy are one component of a multi-disciplinary discharge planning process, led by the attending physician.  Recommendations may be updated based on patient status,  additional functional criteria and insurance authorization.  Follow up Recommendations Other (comment) (potential MBS prn)      Assistance Recommended at Discharge Intermittent Supervision/Assistance  Functional Status Assessment Patient has had a recent decline in their functional status and demonstrates the ability to make significant improvements in function in a reasonable and predictable amount of time.  Frequency and Duration min 2x/week  1 week       Prognosis Prognosis for Safe Diet Advancement: Good      Swallow Study   General Date of Onset: 05/25/22 HPI: Rebecca Osborn is a 81 y.o. female who has a PMH as outlined below including but not limited to asthma for which she follows with Dr. Verlee Monte in our clinic.  She presented for routine follow-up 6/26 per Dr. Melvyn Novas notes, her DOE was worse compared to the previous visit to the point she was getting dyspneic with minimal exertion.  She was also experiencing greater orthopnea than before.  Has had ongoing dry cough.  Denies fevers or chills/sweats.  Has had mild pleuritic chest pain centrally located, nonradiating.  Family has also noticed facial edema over past week or so, no obvious LE edema. Apparently her voice is also changed in the past 3 to 4 months getting much weaker.  She has had generalized weakness over the same time.  To where she can barely ambulate without getting dyspneic and O2 levels falling into the low 80s.  Family checks her O2 levels frequently to keep trend and track of things.  Prior to her pulmonary follow-up 6/26, they noted that her O2 levels were as low as 84 on room air. Family states over past 4 months or so, she is quite sedentary, sits on couch most of the day and watches TV. They have noticed that she gets sleepy very easily and often doses off during TV shows or even sometimes during the odd conversation; CXR on 05/26/22 indicated Low volume chest with atelectasis or infiltrate at the bases. Pt placed on  Bipap overnight, but is currently on 4L O2.Family reports difficulty with liquids intermittently and xerostomia. BSE generated. Type of Study: Bedside Swallow Evaluation Previous Swallow Assessment: n/a Diet Prior to this Study: Thin liquids (clear liquids) Temperature Spikes Noted: No Respiratory Status: Nasal cannula History of Recent Intubation: No Behavior/Cognition: Alert;Cooperative;Other (Comment) (family assists with interpretation; she speaks English min) Oral Cavity Assessment: Dry Oral Care Completed by SLP: Recent completion by staff Oral Cavity - Dentition: Adequate natural dentition Vision: Functional for self-feeding Self-Feeding Abilities: Able to feed self;Needs assist Patient Positioning: Upright in bed Baseline Vocal Quality: Low vocal intensity;Hoarse;Other (comment) (min hoarseness) Volitional Cough: Weak Volitional Swallow: Able to elicit    Oral/Motor/Sensory Function Overall Oral Motor/Sensory Function: Generalized oral weakness   Ice Chips Ice chips: Not tested Other Comments: Pt does not prefer ice or cold drinks   Thin Liquid Thin Liquid: Impaired Presentation: Cup Pharyngeal  Phase Impairments: Throat Clearing - Delayed;Cough - Delayed;Suspected delayed Swallow;Other (comments) (audible swallow) Other Comments: grimacing during swallows intermittently    Nectar Thick Nectar Thick Liquid: Not tested   Honey Thick Honey Thick Liquid: Not tested   Puree Puree: Within functional limits Presentation: Self Fed   Solid     Solid: Within functional  limits Presentation: Self Rebecca Osborn, M.S., CCC-SLP 05/27/2022,11:04 AM

## 2022-05-27 NOTE — Progress Notes (Addendum)
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   Patient requires the use of NIV both qHS and daytime to help with exacerbation periods.  The use of the NIV with treat the patients high pCO2 levels and can reduce risk of exacerbations and future hospitalizations whne used at night and during the day. Patient with need these advanced settings in conjunction with current medication regimen.  BIPAP is not an option due to its functional limitations and severity of the patient's condition.  Failure to have NIV available for use over a 24 hour period could lead to death.  Patient is able to protect their airways and clear secretions on their own.  Myrla Halsted MD PCCM

## 2022-05-27 NOTE — Progress Notes (Signed)
Physical Therapy Treatment Patient Details Name: Rebecca Osborn MRN: [id number] DOB: 1941-07-05 Today's Date: 05/27/2022   History of Present Illness pt is a 81 y/o female presenting 6/26 from an outpatient appointyment for further evaluation and work-up for dyspnea with minimal exertion, worsening orthopnea with SpO2 levels on RA at 84%.  ABG in ED demonstrated acute hypoxia and hypercapnic respiratory failure, Placed on BiPAP.  BIPAP weaned, Echo completed.  PMHx:  HTN, thyroid ds, asthma    PT Comments    Pt progressing well toward goals.  Emphasis on transition, sit to stand and gait with RW and no assist.  Completed pulmonary qualification for supplemental O2 with SpO2 on RA slowly dropping to 87% before rise to 93% on 4L.    Recommendations for follow up therapy are one component of a multi-disciplinary discharge planning process, led by the attending physician.  Recommendations may be updated based on patient status, additional functional criteria and insurance authorization.  Follow Up Recommendations  No PT follow up     Assistance Recommended at Discharge Set up Supervision/Assistance  Patient can return home with the following A little help with bathing/dressing/bathroom;Assistance with cooking/housework;Assist for transportation;Help with stairs or ramp for entrance;Direct supervision/assist for medications management;Direct supervision/assist for financial management   Equipment Recommendations  None recommended by PT    Recommendations for Other Services       Precautions / Restrictions Precautions Precautions: Fall Restrictions Weight Bearing Restrictions: No     Mobility  Bed Mobility Overal bed mobility: Needs Assistance Bed Mobility: Supine to Sit, Sit to Supine     Supine to sit: Min guard Sit to supine: Min guard   General bed mobility comments: Min guard for stability to sit and min A to bring BLE into bed    Transfers Overall transfer level: Needs  assistance Equipment used: Rolling walker (2 wheels) Transfers: Sit to/from Stand Sit to Stand: Min guard           General transfer comment: Min guard for safety due to poor balance    Ambulation/Gait Ambulation/Gait assistance: Min guard Gait Distance (Feet): 180 Feet Assistive device: Rolling walker (2 wheels) Gait Pattern/deviations: Step-through pattern   Gait velocity interpretation: <1.8 ft/sec, indicate of risk for recurrent falls   General Gait Details: steady gait, uses and maneuvers the RW well, no overt dyspnea.  On RA, sats dropped to 93% before gait and ultimately down to 87% with good pleth by return to room.  4L reapplied with return to 93% return to the chair.   Stairs             Wheelchair Mobility    Modified Rankin (Stroke Patients Only)       Balance Overall balance assessment: Mild deficits observed, not formally tested                                          Cognition Arousal/Alertness: Awake/alert Behavior During Therapy: WFL for tasks assessed/performed Overall Cognitive Status: Within Functional Limits for tasks assessed                                          Exercises      General Comments General comments (skin integrity, edema, etc.): vss 96% on 4L [place] ( see pulm qualification note)  Pertinent Vitals/Pain Pain Assessment Pain Assessment: No/denies pain    Home Living Family/patient expects to be discharged to:: Private residence Living Arrangements: Alone;Other (Comment) Available Help at Discharge: Family Type of Home: House Home Access: Stairs to enter Entrance Stairs-Rails: Doctor, general practice of Steps: 1/2   Home Layout: One level Home Equipment: Rollator (4 wheels);Cane - single point;Shower seat      Prior Function            PT Goals (current goals can now be found in the care plan section) Acute Rehab PT Goals PT Goal Formulation: With  patient/family Potential to Achieve Goals: Good Progress towards PT goals: Progressing toward goals    Frequency    Min 3X/week      PT Plan Current plan remains appropriate    Co-evaluation              AM-PAC PT "6 Clicks" Mobility   Outcome Measure  Help needed turning from your back to your side while in a flat bed without using bedrails?: A Little Help needed moving from lying on your back to sitting on the side of a flat bed without using bedrails?: A Little Help needed moving to and from a bed to a chair (including a wheelchair)?: A Little Help needed standing up from a chair using your arms (e.g., wheelchair or bedside chair)?: A Little Help needed to walk in hospital room?: A Little Help needed climbing 3-5 steps with a railing? : A Little 6 Click Score: 18    End of Session Equipment Utilized During Treatment: Oxygen Activity Tolerance: Patient tolerated treatment well Patient left: with call bell/phone within reach;with family/visitor present;in chair;with chair alarm set Nurse Communication: Mobility status PT Visit Diagnosis: Other abnormalities of gait and mobility (R26.89);Difficulty in walking, not elsewhere classified (R26.2)     Time: 7425-9563 PT Time Calculation (min) (ACUTE ONLY): 28 min  Charges:  $Gait Training: 8-22 mins $Therapeutic Activity: 8-22 mins                     05/27/2022  Jacinto Halim., PT Acute Rehabilitation Services [phone]  (pager) [phone]  (office)   [name] 05/27/2022, 5:34 PM

## 2022-05-27 NOTE — Evaluation (Signed)
Occupational Therapy Evaluation Patient Details Name: Rebecca Osborn MRN: [id number] DOB: August 12, 1941 Today's Date: 05/27/2022   History of Present Illness pt is a 81 y/o female presenting 6/26 from an outpatient appointyment for further evaluation and work-up for dyspnea with minimal exertion, worsening orthopnea with SpO2 levels on RA at 84%.  ABG in ED demonstrated acute hypoxia and hypercapnic respiratory failure, Placed on BiPAP.  BIPAP weaned, Echo completed.  PMHx:  HTN, thyroid ds, asthma   Clinical Impression   Pt admitted for concerns listed above. PTA pt reported that she was independent with al ADL's and functional mobility. At this time, she requires min guard to min A for all ADL's and functional mobility, as well as using a RW for stability. She is quick to fatigue and require increased assist. Recommending HHOT to maximize independence and safety. OT will follow acutely.       Recommendations for follow up therapy are one component of a multi-disciplinary discharge planning process, led by the attending physician.  Recommendations may be updated based on patient status, additional functional criteria and insurance authorization.   Follow Up Recommendations  Home health OT    Assistance Recommended at Discharge Frequent or constant Supervision/Assistance  Patient can return home with the following A little help with walking and/or transfers;A little help with bathing/dressing/bathroom;Assistance with cooking/housework;Direct supervision/assist for medications management;Direct supervision/assist for financial management    Functional Status Assessment  Patient has had a recent decline in their functional status and demonstrates the ability to make significant improvements in function in a reasonable and predictable amount of time.  Equipment Recommendations  None recommended by OT    Recommendations for Other Services       Precautions / Restrictions  Precautions Precautions: Fall Restrictions Weight Bearing Restrictions: No      Mobility Bed Mobility Overal bed mobility: Needs Assistance Bed Mobility: Supine to Sit, Sit to Supine     Supine to sit: Min guard Sit to supine: Min assist   General bed mobility comments: Min guard for stability to sit and min A to bring BLE into bed    Transfers Overall transfer level: Needs assistance Equipment used: Rolling walker (2 wheels) Transfers: Sit to/from Stand Sit to Stand: Min guard           General transfer comment: Min guard for safety due to poor balance      Balance Overall balance assessment: Mild deficits observed, not formally tested                                         ADL either performed or assessed with clinical judgement   ADL Overall ADL's : Needs assistance/impaired Eating/Feeding: Set up;Sitting   Grooming: Min guard;Standing   Upper Body Bathing: Min guard;Sitting   Lower Body Bathing: Minimal assistance;Sitting/lateral leans;Sit to/from stand   Upper Body Dressing : Min guard;Sitting   Lower Body Dressing: Minimal assistance;Sitting/lateral leans;Sit to/from stand   Toilet Transfer: Min guard;Minimal assistance;Rolling walker (2 wheels)   Toileting- Architect and Hygiene: Min guard;Sitting/lateral lean;Sit to/from stand       Functional mobility during ADLs: Min guard;Rolling walker (2 wheels) General ADL Comments: Min guard to min A for all ADL's for safety due to weakness and balance deficits.     Vision Baseline Vision/History: 1 Wears glasses Ability to See in Adequate Light: 0 Adequate Patient Visual Report: No change from baseline  Vision Assessment?: No apparent visual deficits     Perception     Praxis      Pertinent Vitals/Pain Pain Assessment Pain Assessment: No/denies pain     Hand Dominance Right   Extremity/Trunk Assessment Upper Extremity Assessment Upper Extremity Assessment:  Overall WFL for tasks assessed   Lower Extremity Assessment Lower Extremity Assessment: Defer to PT evaluation   Cervical / Trunk Assessment Cervical / Trunk Assessment: Normal   Communication Communication Communication: Prefers language other than English   Cognition Arousal/Alertness: Awake/alert Behavior During Therapy: WFL for tasks assessed/performed Overall Cognitive Status: Within Functional Limits for tasks assessed                                       General Comments  VSS on 4L    Exercises     Shoulder Instructions      Home Living Family/patient expects to be discharged to:: Private residence Living Arrangements: Alone;Other (Comment) Available Help at Discharge: Family Type of Home: House Home Access: Stairs to enter [employer] of Steps: 1/2 Entrance Stairs-Rails: Right;Left Home Layout: One level     Bathroom Shower/Tub: Walk-in shower;Tub/shower unit   Bathroom Toilet: Standard     Home Equipment: Rollator (4 wheels);Cane - single point;Shower seat          Prior Functioning/Environment Prior Level of Function : Needs assist             Mobility Comments: cane in home, rollator in community ADLs Comments: assist for ADLs except can get to toilet by herself        OT Problem List: Decreased strength;Decreased activity tolerance;Impaired balance (sitting and/or standing);Decreased safety awareness      OT Treatment/Interventions: Self-care/ADL training;Therapeutic exercise;Energy conservation;DME and/or AE instruction;Therapeutic activities;Patient/family education;Balance training    OT Goals(Current goals can be found in the care plan section) Acute Rehab OT Goals Patient Stated Goal: To get stronger OT Goal Formulation: With patient Time For Goal Achievement: 06/10/22 Potential to Achieve Goals: Good ADL Goals Pt Will Perform Grooming: with modified independence;standing Pt Will Perform Lower Body  Bathing: with modified independence;sitting/lateral leans;sit to/from stand Pt Will Perform Lower Body Dressing: with modified independence;sitting/lateral leans;sit to/from stand Pt Will Transfer to Toilet: with modified independence;ambulating Pt Will Perform Toileting - Clothing Manipulation and hygiene: with modified independence;sitting/lateral leans;sit to/from stand Additional ADL Goal #1: Pt will tolerate standing functional tasks for 10 mins to simulate home environment.  OT Frequency: Min 2X/week    Co-evaluation              AM-PAC OT "6 Clicks" Daily Activity     Outcome Measure Help from another person eating meals?: A Little Help from another person taking care of personal grooming?: A Little Help from another person toileting, which includes using toliet, bedpan, or urinal?: A Little Help from another person bathing (including washing, rinsing, drying)?: A Little Help from another person to put on and taking off regular upper body clothing?: A Little Help from another person to put on and taking off regular lower body clothing?: A Little 6 Click Score: 18   End of Session Equipment Utilized During Treatment: Gait belt;Rolling walker (2 wheels) Nurse Communication: Mobility status  Activity Tolerance: Patient tolerated treatment well Patient left: in bed;with call bell/phone within reach;with family/visitor present  OT Visit Diagnosis: Unsteadiness on feet (R26.81);Other abnormalities of gait and mobility (R26.89);Muscle weakness (generalized) (M62.81)  Time: 4782-9562 OT Time Calculation (min): 25 min Charges:  OT General Charges $OT Visit: 1 Visit OT Evaluation $OT Eval Moderate Complexity: 1 Mod OT Treatments $Self Care/Home Management : 8-22 mins  Kele Withem H., OTR/L Acute Rehabilitation  Kalysta Kneisley Elane Bing Plume 05/27/2022, 5:01 PM

## 2022-05-27 NOTE — Progress Notes (Addendum)
NAME:  Rebecca Osborn, MRN:  [id number], DOB:  [birth year], LOS: 2 ADMISSION DATE:  05/25/2022, CONSULTATION DATE:  05/25/22 REFERRING MD:  Reather Converse CHIEF COMPLAINT:  Dyspnea   History of Present Illness:  Rebecca Osborn is a 81 y.o. female who has a PMH as outlined below including but not limited to asthma for which she follows with Dr. Verlee Monte in our clinic.  She presented for routine follow-up 6/26 per Dr. Melvyn Novas notes, her DOE was worse compared to the previous visit to the point she was getting dyspneic with minimal exertion.  She was also experiencing greater orthopnea than before.  Has had ongoing dry cough.  Denies fevers or chills/sweats.  Has had mild pleuritic chest pain centrally located, nonradiating.  Family has also noticed facial edema over past week or so, no obvious LE edema. Apparently her voice is also changed in the past 3 to 4 months getting much weaker.  She has had generalized weakness over the same time.  To where she can barely ambulate without getting dyspneic and O2 levels falling into the low 80s.  Family checks her O2 levels frequently to keep trend and track of things.  Prior to her pulmonary follow-up 6/26, they noted that her O2 levels were as low as 84 on room air. Family states over past 4 months or so, she is quite sedentary, sits on couch most of the day and watches TV. They have noticed that she gets sleepy very easily and often doses off during TV shows or even sometimes during the odd conversation.   She was sent to the ED from the outpatient appointment further evaluation and work-up.  ABG in the ED demonstrated acute hypoxic and hypercapnic respiratory failure (7.19/98/92).  She was placed on BiPAP PCCM was called for admission.  The rest of her work-up is essentially negative.  CTA of the chest is pending.  At time my evaluation, she is on BiPAP 12/5, she is awake and able to answer basic questions and follow basic commands appropriately.  Her to sons at the bedside and  were able to assist with history taking and translation..   Pertinent  Medical History:  has LOWER LEG, ARTHRITIS, DEGEN./OSTEO; HIP PAIN; DISC DEGENERATION; BURSITIS, HIP; Acute dyspnea; Asthma; Respiratory failure with hypoxia and hypercapnia ([place]); and Respiratory acidosis on their problem list.  Significant Hospital Events: Including procedures, antibiotic start and stop dates in addition to other pertinent events   6/26 admit with resp failure; on bipap  Interim History / Subjective:  On Bipap overnight; abg improving Patient taken off bipap and on [place] AO  Objective:  Blood pressure (!) 119/94, pulse 73, temperature 97.9 F (36.6 C), temperature source Oral, resp. rate (!) 24, height 5' (1.524 m), weight 74.4 kg, SpO2 97 %.    FiO2 (%):  [40 %] 40 %   Intake/Output Summary (Last 24 hours) at 05/27/2022 0714 Last data filed at 05/26/2022 1400 Gross per 24 hour  Intake 520 ml  Output --  Net 520 ml    Filed Weights   05/25/22 1831 05/26/22 0500 05/27/22 0500  Weight: 75.8 kg 75.8 kg 74.4 kg    Examination: General: ill appearing elderly female on [place] HEENT: MM pink/moist; [place] in place Neuro: Aox3; MAE CV: s1s2, no m/r/g PULM:  dim rhonchi BS bilaterally; on [place]; weak cough GI: soft, bsx4 active  Extremities: warm/dry, no edema  Skin: no rashes or lesions appreciated  Labs/imaging personally reviewed:  CTA chest 6/26 >negative Echo 6/26 >  LVEF 60-65%; Grade I diastolic MRI 9/47 >   Assessment & Plan:   Acute on probable chronic hypoxic with acute hypercapnic respiratory failure - unclear etiology but possibly some viral component. Asthma: Breztri at home; sees Dr. 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 - F/u outpatient w/ Dr. Verlee Monte as planned, will need PFT's  Upper airway stridor with hx of recent dysphasia / voice change. -on ppi, pepcid, and sucralfate at home P: - SLP eval - Cont Solumedrol - cont home ppi, pepcid, sucralfate  Grade I diastolic HF P: -daily weights/strict I/O's  Generalized progressive weakness. P: -MRI - PT/OT  Hx Hypothyroidism. P: - Continue home Synthroid  Hx HTN. P: - Continue home Amlodipine  Best practice (evaluated daily):  Diet/type: NPO DVT prophylaxis: prophylactic heparin  GI prophylaxis: N/A Lines: N/A Foley:  N/A Code Status:  full code Last date of multidisciplinary goals of care discussion: 6/28 updated daughter at bedside).  Labs   CBC: Recent Labs  Lab 05/25/22 1700 05/25/22 1936 05/26/22 0111 05/26/22 0634 05/26/22 1731 05/27/22 0132 05/27/22 0447  WBC 6.2  --  6.0  --   --  3.4*  --   NEUTROABS 4.0  --   --   --   --   --   --   HGB 13.0   < > 12.5 14.3 14.3 11.5* 12.9  HCT 40.9   < > 41.3 42.0 42.0 37.7 38.0  MCV 90.3  --  91.4  --   --  91.3  --   PLT 355  --  328  --   --  333  --    < > = values in this interval not displayed.     Basic Metabolic Panel: Recent Labs  Lab 05/25/22 1700 05/25/22 1936 05/26/22 0111 05/26/22 0634 05/26/22 1731 05/27/22 0132 05/27/22 0447  NA 138   < > 140 137 136 139 137  K 4.1   < > 4.0 4.3 4.4 4.5 4.2  CL 98  --  96*  --   --  94*  --   CO2 30  --  33*  --   --  33*  --   GLUCOSE 92  --  96  --   --  117*  --   BUN 14  --  12  --   --  19  --   CREATININE 0.46  --  0.48  --   --  0.50  --   CALCIUM 9.3  --  9.5  --   --  9.8  --   MG  --   --  2.0  --   --   --   --   PHOS  --   --  4.1  --   --   --   --    < > = values in this interval not displayed.    GFR: Estimated Creatinine Clearance: 51.4 mL/min (by C-G formula based on SCr of 0.5 mg/dL). Recent Labs  Lab 05/25/22 1700  05/26/22 0111 05/27/22 0132  PROCALCITON  --  <0.10 <0.10  WBC 6.2 6.0 3.4*     Liver Function Tests: Recent Labs  Lab 05/25/22  1700  AST 19  ALT 16  ALKPHOS 56  BILITOT 0.3  PROT 6.6  ALBUMIN 3.7    Recent Labs  Lab 05/25/22 1700  LIPASE 26    No results for input(s): "AMMONIA" in the last 168 hours.  ABG    Component Value Date/Time   PHART 7.303 (L) 05/27/2022 0447   PCO2ART 80.1 ([place]) 05/27/2022 0447   PO2ART 97 05/27/2022 0447   HCO3 39.8 (H) 05/27/2022 0447   TCO2 42 (H) 05/27/2022 0447   O2SAT 96 05/27/2022 0447     Coagulation Profile: No results for input(s): "INR", "PROTIME" in the last 168 hours.  Cardiac Enzymes: No results for input(s): "CKTOTAL", "CKMB", "CKMBINDEX", "TROPONINI" in the last 168 hours.  HbA1C: No results found for: "HGBA1C"  CBG: Recent Labs  Lab 05/26/22 1112 05/26/22 1516 05/26/22 2003 05/26/22 2352 05/27/22 0344  GLUCAP 142* 171* 129* 123* 115*     Review of Systems:   All negative; except for those that are bolded, which indicate positives.  Constitutional: weight loss, weight gain, night sweats, fevers, chills, fatigue, weakness.  HEENT: headaches, sore throat, sneezing, nasal congestion, post nasal drip, difficulty swallowing, tooth/dental problems, visual complaints, visual changes, ear aches. Neuro: difficulty with speech, weakness, numbness, ataxia. CV:  chest pain, orthopnea, PND, swelling in lower extremities, dizziness, palpitations, syncope.  Resp: cough, hemoptysis, dyspnea, wheezing. GI: heartburn, indigestion, abdominal pain, nausea, vomiting, diarrhea, constipation, change in bowel habits, loss of appetite, hematemesis, melena, hematochezia.  GU: dysuria, change in color of urine, urgency or frequency, flank pain, hematuria. MSK: joint pain or swelling, decreased range of motion. Psych: change in mood or affect, depression, anxiety, suicidal ideations, homicidal ideations. Skin: rash, itching,  bruising.   Past Medical History:  She,  has a past medical history of Arthritis, Asthma, Constipation, Hypertension, and Thyroid disease.   Surgical History:   Past Surgical History:  Procedure Laterality Date   APPENDECTOMY     BACK SURGERY     JOINT REPLACEMENT     KNEE ARTHROSCOPY       Social History:   reports that she has never smoked. She has never used smokeless tobacco. She reports that she does not drink alcohol and does not use drugs.   Family History:  Her family history includes Heart Problems in her father.   Allergies Allergies  Allergen Reactions   Penicillins Hives   Nsaids Nausea Only    GI upset      Home Medications  Prior to Admission medications   Medication Sig Start Date End Date Taking? Authorizing Provider  acetaminophen (TYLENOL) 650 MG CR tablet Take 650 mg by mouth at bedtime as needed for pain.   Yes [provider]  albuterol (PROVENTIL) (2.5 MG/3ML) 0.083% nebulizer solution Take 2.5 mg by nebulization every 6 (six) hours as needed for wheezing or shortness of breath. 04/25/22  Yes [provider]  albuterol (VENTOLIN HFA) 108 (90 Base) MCG/ACT inhaler Inhale 1-2 puffs into the lungs every 4 (four) hours as needed for wheezing or shortness of breath. 08/27/21  Yes [provider]  amLODipine (NORVASC) 10 MG tablet Take 10 mg by mouth daily.   Yes [provider]  Budeson-Glycopyrrol-Formoterol (BREZTRI AEROSPHERE) 160-9-4.8 MCG/ACT AERO Inhale 2 puffs into the lungs in the morning and at bedtime. 04/20/22  Yes Maryjane Hurter, MD  cetirizine (ZYRTEC) 10 MG tablet Take 10 mg by mouth daily as needed for allergies. 07/05/21  Yes [provider]  dexlansoprazole (DEXILANT) 60 MG  capsule Take 60 mg by mouth daily. 10/30/21  Yes [provider]  diclofenac Sodium (VOLTAREN) 1 % GEL Apply 2-4 g topically 4 (four) times daily as needed (joint pain). 02/27/22  Yes [provider]  DULoxetine  (CYMBALTA) 30 MG capsule Take 1 capsule (30 mg total) by mouth daily. 01/07/22  Yes Sater, Nanine Means, MD  famotidine (PEPCID) 20 MG tablet Take 20 mg by mouth at bedtime as needed for heartburn or indigestion. 05/22/21  Yes [provider]  fluticasone (FLONASE) 50 MCG/ACT nasal spray Place 1 spray into both nostrils daily. 06/23/21  Yes [provider]  levothyroxine (SYNTHROID, LEVOTHROID) 88 MCG tablet Take 88 mcg by mouth daily.   Yes [provider]  LIDOCAINE EX Apply 1 patch topically daily as needed (stomach pain).   Yes [provider]  Misc. Devices (ROLLATOR ULTRA-LIGHT) MISC Rollator style walker 01/07/22  Yes Sater, Nanine Means, MD  montelukast (SINGULAIR) 10 MG tablet Take 10 mg by mouth at bedtime. 10/27/21  Yes [provider]  Multiple Vitamins-Minerals (MULTIVITAMIN WITH MINERALS) tablet Take 1 tablet by mouth daily.   Yes [provider]  oxyCODONE-acetaminophen (PERCOCET/ROXICET) 5-325 MG tablet Take 0.25-0.5 tablets by mouth daily as needed for moderate pain. 10/13/21  Yes [provider]  rOPINIRole (REQUIP) 0.5 MG tablet Take 0.5 mg by mouth at bedtime as needed (restless legs/cramps). 10/27/21  Yes [provider]  sodium chloride HYPERTONIC 3 % nebulizer solution Take by nebulization as needed for other. Please file under diagnosis asthma J45.909 Patient taking differently: Take 4 mLs by nebulization daily as needed for other or cough. Please file under diagnosis asthma J45.909 05/06/22  Yes Maryjane Hurter, MD  Spacer/Aero-Holding Josiah Lobo DEVI 1 each by Does not apply route in the morning and at bedtime. Please use with MDI inhalers 11/04/21  Yes Maryjane Hurter, MD  sucralfate (CARAFATE) 1 g tablet Take 1 g by mouth 4 (four) times daily. 09/05/21  Yes [provider]  TRULANCE 3 MG TABS Take 3 mg by mouth daily. 10/14/21  Yes [provider]  ALPRAZolam Duanne Moron) 0.5 MG tablet Take 1-2 tablets  by mouth 30-60 min prior to MRI. Can take additional tablet at time of test if needed. Must have driver to and from test, can cause drowsiness. Patient not taking: Reported on 05/25/2022 12/11/21   Britt Bottom, MD        JD Rexene Agent [facility] 05/27/2022, 7:14 AM  Please see [email or web address] for pager details.  From 7A-7P if no response, please call [phone]. After hours, please call ELink [phone].

## 2022-05-27 NOTE — Progress Notes (Signed)
SATURATION QUALIFICATIONS: (This note is used to comply with regulatory documentation for home oxygen)  Patient Saturations on Room Air at Rest = 95%  Patient Saturations on Room Air while Ambulating = 87%  Patient Saturations on 4 Liters of oxygen while Ambulating = 93%  Please briefly explain why patient needs home oxygen: Pt tolerated ambulation on RA generally well initially then dropped to 87% with a good PLETH as distance progressed and pt fatigued.  Pt will need supplemental O2 during home/community mobility and during therapies until she can wean off. 05/27/2022  Ginger Carne., PT Acute Rehabilitation Services [phone]  (pager) [phone]  (office)

## 2022-05-27 NOTE — Telephone Encounter (Signed)
Since the call to the office, pt has had a recent OV and all was handled during that visit. Nothing further needed.

## 2022-05-27 NOTE — TOC Initial Note (Signed)
Transition of Care [facility]) - Initial/Assessment Note    Patient Details  Name: Rebecca Osborn MRN: [id number] Date of Birth: [birth year]  Transition of Care [facility]) CM/SW Contact:    Sharin Mons, RN Phone Number: 05/27/2022, 2:29 PM  Clinical Narrative:                 Consult received for NIV. Referral made with Zach with [employer] for NIV. Approval pending.   TOC monitoring and will assist with needs...     Barriers to Discharge: Continued Medical Work up   Patient Goals and CMS Choice     Choice offered to / list presented to : Patient  Expected Discharge Plan and Services                           DME Arranged: NIV DME Agency: [employer] Date DME Agency Contacted: 05/27/22 Time DME Agency Contacted: 77 Representative spoke with at [place]: Thedore Mins            Prior Living Arrangements/Services                       Activities of Daily Living      Permission Sought/Granted                  Emotional Assessment              Admission diagnosis:  Respiratory acidosis [E87.29] Heart murmur [R01.1] Hypercapnia [R06.89] Hypoxia [R09.02] Acute dyspnea [R06.00] Respiratory failure with hypoxia and hypercapnia ([place]) [J96.91, J96.92] Patient Active Problem List   Diagnosis Date Noted   Hypercapnia    Respiratory failure with hypoxia and hypercapnia ([place]) 05/25/2022   Respiratory acidosis    Acute dyspnea 09/22/2021   Asthma 09/22/2021   LOWER LEG, ARTHRITIS, DEGEN./OSTEO 06/25/2008   HIP PAIN 06/25/2008   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 [place] [address], [address] [place] [address] Phone: [phone] Fax: [phone]     Social Determinants of Health (SDOH) Interventions    Readmission Risk Interventions     No data to display

## 2022-05-28 ENCOUNTER — Ambulatory Visit (HOSPITAL_COMMUNITY): Payer: Medicare Other | Admitting: Psychiatry

## 2022-05-28 ENCOUNTER — Ambulatory Visit (HOSPITAL_COMMUNITY): Payer: Self-pay | Admitting: Psychiatry

## 2022-05-28 DIAGNOSIS — J9601 Acute respiratory failure with hypoxia: Secondary | ICD-10-CM | POA: Diagnosis not present

## 2022-05-28 DIAGNOSIS — J9602 Acute respiratory failure with hypercapnia: Secondary | ICD-10-CM | POA: Diagnosis not present

## 2022-05-28 LAB — BLOOD GAS, ARTERIAL
Acid-Base Excess: 17.2 mmol/L — ABNORMAL HIGH (ref 0.0–2.0)
Bicarbonate: 46.8 mmol/L — ABNORMAL HIGH (ref 20.0–28.0)
Drawn by: 42783
O2 Saturation: 99.2 %
Patient temperature: 37
pCO2 arterial: 81 mmHg (ref 32–48)
pH, Arterial: 7.37 (ref 7.35–7.45)
pO2, Arterial: 88 mmHg (ref 83–108)

## 2022-05-28 LAB — CBC
HCT: 37.8 % (ref 36.0–46.0)
Hemoglobin: 11.9 g/dL — ABNORMAL LOW (ref 12.0–15.0)
MCH: 28.7 pg (ref 26.0–34.0)
MCHC: 31.5 g/dL (ref 30.0–36.0)
MCV: 91.3 fL (ref 80.0–100.0)
Platelets: 309 10*3/uL (ref 150–400)
RBC: 4.14 MIL/uL (ref 3.87–5.11)
RDW: 15.8 % — ABNORMAL HIGH (ref 11.5–15.5)
WBC: 5.6 10*3/uL (ref 4.0–10.5)
nRBC: 0 % (ref 0.0–0.2)

## 2022-05-28 LAB — BASIC METABOLIC PANEL
Anion gap: 5 (ref 5–15)
BUN: 21 mg/dL (ref 8–23)
CO2: 38 mmol/L — ABNORMAL HIGH (ref 22–32)
Calcium: 9 mg/dL (ref 8.9–10.3)
Chloride: 99 mmol/L (ref 98–111)
Creatinine, Ser: 0.46 mg/dL (ref 0.44–1.00)
GFR, Estimated: >60 mL/min (ref >60)
Glucose, Bld: 100 mg/dL — ABNORMAL HIGH (ref 70–99)
Potassium: 4.2 mmol/L (ref 3.5–5.1)
Sodium: 142 mmol/L (ref 135–145)

## 2022-05-28 LAB — LEGIONELLA PNEUMOPHILA SEROGP 1 UR AG: L. pneumophila Serogp 1 Ur Ag: NEGATIVE

## 2022-05-28 LAB — ACETYLCHOLINE RECEPTOR, BINDING: Acety choline binding ab: <0.03 nmol/L (ref 0.00–0.24)

## 2022-05-28 MED ORDER — PREDNISONE 10 MG PO TABS: 10.0000 mg | ORAL_TABLET | Freq: Every day | ORAL | Status: DC

## 2022-05-28 MED ORDER — PREDNISONE 20 MG PO TABS
30.0000 mg | ORAL_TABLET | Freq: Every day | ORAL | Status: DC
Start: 2022-05-31 — End: 2022-05-29

## 2022-05-28 MED ORDER — PREDNISONE 20 MG PO TABS: 20.0000 mg | ORAL_TABLET | Freq: Every day | ORAL | Status: DC

## 2022-05-28 MED ORDER — PREDNISONE 20 MG PO TABS
40.0000 mg | ORAL_TABLET | Freq: Every day | ORAL | Status: DC
Start: 2022-05-28 — End: 2022-05-29
  Administered 2022-05-28 – 2022-05-29 (×2): 40 mg via ORAL
  Filled 2022-05-28 (×2): qty 2

## 2022-05-28 MED ORDER — SIMETHICONE 80 MG PO CHEW
80.0000 mg | CHEWABLE_TABLET | Freq: Four times a day (QID) | ORAL | Status: DC | PRN
  Administered 2022-05-28: 80 mg via ORAL
  Filled 2022-05-28: qty 1

## 2022-05-28 NOTE — Progress Notes (Signed)
NAME:  Rebecca Osborn, MRN:  [id number], DOB:  October 16, 1941, LOS: 3 ADMISSION DATE:  05/25/2022, CONSULTATION DATE:  05/25/22 REFERRING MD:  Reather Converse CHIEF COMPLAINT:  Dyspnea   History of Present Illness:  Rebecca Osborn is a 81 y.o. female who has a PMH as outlined below including but not limited to asthma for which she follows with Dr. Verlee Monte in our clinic.  She presented for routine follow-up 6/26 per Dr. Melvyn Novas notes, her DOE was worse compared to the previous visit to the point she was getting dyspneic with minimal exertion.  She was also experiencing greater orthopnea than before.  Has had ongoing dry cough.  Denies fevers or chills/sweats.  Has had mild pleuritic chest pain centrally located, nonradiating.  Family has also noticed facial edema over past week or so, no obvious LE edema. Apparently her voice is also changed in the past 3 to 4 months getting much weaker.  She has had generalized weakness over the same time.  To where she can barely ambulate without getting dyspneic and O2 levels falling into the low 80s.  Family checks her O2 levels frequently to keep trend and track of things.  Prior to her pulmonary follow-up 6/26, they noted that her O2 levels were as low as 84 on room air. Family states over past 4 months or so, she is quite sedentary, sits on couch most of the day and watches TV. They have noticed that she gets sleepy very easily and often doses off during TV shows or even sometimes during the odd conversation.   She was sent to the ED from the outpatient appointment further evaluation and work-up.  ABG in the ED demonstrated acute hypoxic and hypercapnic respiratory failure (7.19/98/92).  She was placed on BiPAP PCCM was called for admission.  The rest of her work-up is essentially negative.  CTA of the chest is pending.  At time my evaluation, she is on BiPAP 12/5, she is awake and able to answer basic questions and follow basic commands appropriately.  Her to sons at the bedside and  were able to assist with history taking and translation..   Pertinent  Medical History:  has LOWER LEG, ARTHRITIS, DEGEN./OSTEO; HIP PAIN; DISC DEGENERATION; BURSITIS, HIP; Acute dyspnea; Asthma; Respiratory failure with hypoxia and hypercapnia ([place]); Respiratory acidosis; and Hypercapnia on their problem list.  Significant Hospital Events: Including procedures, antibiotic start and stop dates in addition to other pertinent events   6/26 admit with resp failure; on bipap  Interim History / Subjective:   Comfortable. No distress. Met with family at bedside.   Objective:  Blood pressure 126/67, pulse 81, temperature 97.7 F (36.5 C), temperature source Oral, resp. rate 16, height 5' (1.524 m), weight 76.5 kg, SpO2 97 %.        Intake/Output Summary (Last 24 hours) at 05/28/2022 1024 Last data filed at 05/27/2022 1400 Gross per 24 hour  Intake 240 ml  Output --  Net 240 ml   Filed Weights   05/26/22 0500 05/27/22 0500 05/28/22 0447  Weight: 75.8 kg 74.4 kg 76.5 kg    Examination: General: elderly FM, resting in bed HEENT: NCAT, tracking Neuro: AAOX3 CV: RRR, s1 s2  PULM: CTAB  GI: soft, nt nd  Extremities: no edema  Skin: no rash   Labs/imaging personally reviewed:  CTA chest 6/26 >negative Echo 6/26 > LVEF 60-65%; Grade I diastolic MRI 9/37 >   Assessment & Plan:   Acute on probable chronic hypoxic with acute hypercapnic  respiratory failure - unclear etiology but possibly some viral component. Asthma: Breztri at home; sees Dr. 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 Verlee Monte - would consider  outpatient neurology evaluation and consideration for EMG testing   Upper airway stridor with hx of recent dysphasia / voice change. -on ppi, pepcid, and sucralfate at home  Grade I diastolic HF P: -daily weights/strict I/O's  Generalized progressive weakness. P: -MRI - PT/OT - outpatient neurology evaluation - consider outpatient EMG testing   Hx Hypothyroidism. P: - Continue home Synthroid  Hx HTN. P: - Continue home Amlodipine  Best practice (evaluated daily):  Diet/type: NPO DVT prophylaxis: prophylactic heparin  GI prophylaxis: N/A Lines: N/A Foley:  N/A Code Status:  full code Last date of multidisciplinary goals of care discussion: I spoke with family at bedside   Labs    CBC: Recent Labs  Lab 05/25/22 1700 05/25/22 1936 05/26/22 0111 05/26/22 0634 05/26/22 1731 05/27/22 0132 05/27/22 0447 05/28/22 0247  WBC 6.2  --  6.0  --   --  3.4*  --  5.6  NEUTROABS 4.0  --   --   --   --   --   --   --   HGB 13.0   < > 12.5 14.3 14.3 11.5* 12.9 11.9*  HCT 40.9   < > 41.3 42.0 42.0 37.7 38.0 37.8  MCV 90.3  --  91.4  --   --  91.3  --  91.3  PLT 355  --  328  --   --  333  --  309   < > = values in this interval not displayed.    Basic Metabolic Panel: Recent Labs  Lab 05/25/22 1700 05/25/22 1936 05/26/22 0111 05/26/22 0634 05/26/22 1731 05/27/22 0132 05/27/22 0447 05/28/22 0247  NA 138   < > 140 137 136 139 137 142  K 4.1   < > 4.0 4.3 4.4 4.5 4.2 4.2  CL 98  --  96*  --   --  94*  --  99  CO2 30  --  33*  --   --  33*  --  38*  GLUCOSE 92  --  96  --   --  117*  --  100*  BUN 14  --  12  --   --  19  --  21  CREATININE 0.46  --  0.48  --   --  0.50  --  0.46  CALCIUM 9.3  --  9.5  --   --  9.8  --  9.0  MG  --   --  2.0  --   --   --   --   --   PHOS  --   --  4.1  --   --   --   --   --    < > = values in this interval not displayed.   GFR: Estimated Creatinine Clearance: 52.1 mL/min (by C-G formula based on SCr of 0.46 mg/dL). Recent  Labs  Lab 05/25/22 1700 05/26/22 0111 05/27/22 0132 05/28/22 0247  PROCALCITON  --  <0.10 <  0.10  --   WBC 6.2 6.0 3.4* 5.6    Liver Function Tests: Recent Labs  Lab 05/25/22 1700  AST 19  ALT 16  ALKPHOS 56  BILITOT 0.3  PROT 6.6  ALBUMIN 3.7   Recent Labs  Lab 05/25/22 1700  LIPASE 26   No results for input(s): "AMMONIA" in the last 168 hours.  ABG    Component Value Date/Time   PHART 7.37 05/28/2022 0945   PCO2ART 81 (HH) 05/28/2022 0945   PO2ART 88 05/28/2022 0945   HCO3 46.8 (H) 05/28/2022 0945   TCO2 42 (H) 05/27/2022 0447   O2SAT 99.2 05/28/2022 0945     Coagulation Profile: No results for input(s): "INR", "PROTIME" in the last 168 hours.  Cardiac Enzymes: Recent Labs  Lab 05/26/22 0102  CKTOTAL 60    HbA1C: No results found for: "HGBA1C"  CBG: Recent Labs  Lab 05/27/22 0344 05/27/22 0754 05/27/22 1114 05/27/22 1525 05/27/22 1931  GLUCAP 115* 105* 150* 234* 233*     Garner Nash, DO [facility] 05/28/2022 10:25 AM

## 2022-05-28 NOTE — Progress Notes (Signed)
Pt removed bi-pap by herself. Placed on 4l o2 via [place]. Pt walked to the bathroom and back. 02 sat 97% in 4l 02 via [place].

## 2022-05-28 NOTE — Progress Notes (Signed)
PROGRESS NOTE  Rebecca Osborn  WVP:[id number] DOB: October 23, 1941 DOA: 05/25/2022 PCP: Windell Hummingbird, PA-C   Brief Narrative:  Patient is a 81 year old female with past medical history of asthma, arthritis, back pain, hypothyroidism, hypertension who was admitted for worsening shortness of breath, orthopnea, dry cough, stridor, becoming more weak.  She was sent to the ED from pulmonary clinic.  ABG in the emergency department showed CO2 of 98, pH of 7.19.  She was placed on BiPAP, critical care consulted and was admitted.  Patient's mental status has improved and she has been transferred to our service on 6/29.  Currently on 4 L of oxygen.  Plan is to discharge her to home with noninvasive ventilation when appropriate.  PCCM following.  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  History of asthma, follows with Dr. Verlee Monte as an outpatient.  Viral panel negative.  Sniff test on 6/27 showed minimal diaphragm movement on the left.  MRI of the brain/cervical spine did not show any acute abnormalities but showed cervical spondylosis, multilevel foraminal stenosis. PCCM recommending NIV support on discharge for chronic hypercapnic respiratory failure. ABG done this morning showed pH of 7.37, PCO2 of 81. We also recommend outpatient neurology evaluation, consideration of EMG. She was on 3 L of oxygen this morning.  Not on oxygen at home.  We will continue to wean  History of asthma: Follows with pulmonology as an outpatient.  Continue home inhalers  Generalized weakness: This was most likely contributed by hypercarbia.  PT/OT recommended home health on discharge.  Currently alert and oriented.  Upper airway stridor: Patient history of dysphagia/voice change.  Again this may be contributed  by diaphragmatic paralysis.  On PPI, Pepcid, sucralfate at home.  Also on slow steroid taper  Diastolic congestive heart failure: Echo done here showed EF of 60-65 percent, grade 1 diastolic dysfunction.  Currently she is euvolemic  Hypothyroidism: Continue Synthyroid  Hypertension: Continue amlodipine           DVT prophylaxis:rivaroxaban (XARELTO) tablet 10 mg Start: 05/27/22 1700 SCDs Start: 05/25/22 2213 rivaroxaban (XARELTO) tablet 10 mg     Code Status: Full Code  Family Communication: Discussed with daughter at bedside  Patient status: Inpatient   Patient is from : Home  Anticipated discharge to: Home  Estimated DC date: 1 to 2 days   Consultants: PCCM  Procedures: None  Antimicrobials:  Anti-infectives (From admission, onward)    None       Subjective: Patient seen and examined at bedside this morning.  Overall comfortable, still weak but feels better, lying in bed.  Denies any worsening shortness of breath or cough.  Objective: Vitals:   05/28/22 0447 05/28/22 0823 05/28/22 0829 05/28/22 1210  BP:  126/67  129/64  Pulse:  84 81 95  Resp:  19 16 (!) 23  Temp:  97.7 F (36.5 C)  98.3 F (36.8 C)  TempSrc:  Oral  Oral  SpO2:  98% 97% 96%  Weight: 76.5 kg     Height:        Intake/Output Summary (Last 24 hours) at 05/28/2022 1330 Last data filed at 05/27/2022 1400 Gross per 24 hour  Intake 240 ml  Output --  Net 240 ml   Filed Weights   05/26/22 0500 05/27/22 0500 05/28/22 0447  Weight: 75.8 kg 74.4  kg 76.5 kg    Examination:  General exam: Overall comfortable, not in distress, pleasant elderly female HEENT: PERRL Respiratory system: Diminished sounds bilaterally, no wheezes or crackles  Cardiovascular system: S1 & S2 heard, RRR.  Gastrointestinal system: Abdomen is nondistended, soft and nontender. Central nervous system: Alert and oriented Extremities: No edema, no clubbing ,no cyanosis Skin: No rashes, no ulcers,no icterus      Data Reviewed: I have personally reviewed following labs and imaging studies  CBC: Recent Labs  Lab 05/25/22 1700 05/25/22 1936 05/26/22 0111 05/26/22 0634 05/26/22 1731 05/27/22 0132 05/27/22 0447 05/28/22 0247  WBC 6.2  --  6.0  --   --  3.4*  --  5.6  NEUTROABS 4.0  --   --   --   --   --   --   --   HGB 13.0   < > 12.5 14.3 14.3 11.5* 12.9 11.9*  HCT 40.9   < > 41.3 42.0 42.0 37.7 38.0 37.8  MCV 90.3  --  91.4  --   --  91.3  --  91.3  PLT 355  --  328  --   --  333  --  309   < > = values in this interval not displayed.   Basic Metabolic Panel: Recent Labs  Lab 05/25/22 1700 05/25/22 1936 05/26/22 0111 05/26/22 0634 05/26/22 1731 05/27/22 0132 05/27/22 0447 05/28/22 0247  NA 138   < > 140 137 136 139 137 142  K 4.1   < > 4.0 4.3 4.4 4.5 4.2 4.2  CL 98  --  96*  --   --  94*  --  99  CO2 30  --  33*  --   --  33*  --  38*  GLUCOSE 92  --  96  --   --  117*  --  100*  BUN 14  --  12  --   --  19  --  21  CREATININE 0.46  --  0.48  --   --  0.50  --  0.46  CALCIUM 9.3  --  9.5  --   --  9.8  --  9.0  MG  --   --  2.0  --   --   --   --   --   PHOS  --   --  4.1  --   --   --   --   --    < > = values in this interval not displayed.     Recent Results (from the past 240 hour(s))  MRSA Next Gen by PCR, Nasal     Status: Abnormal   Collection Time: 05/25/22 11:35 PM   Specimen: Nasal Mucosa; Nasal Swab  Result Value Ref Range Status   MRSA by PCR Next Gen NEGATIVE (A) NOT DETECTED Final    Comment: Performed at [facility], [address] [place], [place] [address]  Respiratory (~20 pathogens) panel by PCR     Status: None   Collection Time: 05/26/22  4:34 PM   Specimen: Nasopharyngeal Swab; Respiratory  Result Value Ref Range Status   Adenovirus NOT DETECTED NOT DETECTED Final   Coronavirus 229E NOT DETECTED NOT DETECTED Final    Comment: (NOTE) The Coronavirus on the Respiratory Panel, DOES NOT test for the novel  Coronavirus (2019 nCoV)     Coronavirus HKU1 NOT DETECTED NOT DETECTED Final   Coronavirus NL63 NOT DETECTED NOT DETECTED Final  Coronavirus OC43 NOT DETECTED NOT DETECTED Final   Metapneumovirus NOT DETECTED NOT DETECTED Final   Rhinovirus / Enterovirus NOT DETECTED NOT DETECTED Final   Influenza A NOT DETECTED NOT DETECTED Final   Influenza B NOT DETECTED NOT DETECTED Final   Parainfluenza Virus 1 NOT DETECTED NOT DETECTED Final   Parainfluenza Virus 2 NOT DETECTED NOT DETECTED Final   Parainfluenza Virus 3 NOT DETECTED NOT DETECTED Final   Parainfluenza Virus 4 NOT DETECTED NOT DETECTED Final   Respiratory Syncytial Virus NOT DETECTED NOT DETECTED Final   Bordetella pertussis NOT DETECTED NOT DETECTED Final   Bordetella Parapertussis NOT DETECTED NOT DETECTED Final   Chlamydophila pneumoniae NOT DETECTED NOT DETECTED Final   Mycoplasma pneumoniae NOT DETECTED NOT DETECTED Final    Comment: Performed at [facility], [place] [address], [place], [place] [address]     Radiology Studies: MR CERVICAL SPINE WO CONTRAST  Result Date: 05/27/2022 CLINICAL DATA:  Provided history: Neuro deficit, acute, stroke suspected. Ataxia, nontraumatic, cervical pathology suspected. Additional history provided: Hypercapnia associated with chronic respiratory failure secondary to neuromuscular disorder. EXAM: MRI HEAD WITHOUT CONTRAST MRI CERVICAL SPINE WITHOUT CONTRAST TECHNIQUE: Multiplanar, multiecho pulse sequences of the brain and surrounding structures, and cervical spine, to include the craniocervical junction and cervicothoracic junction, were obtained without intravenous contrast. COMPARISON:  Brain MRI 12/14/2021. Cervical spine MRI 11/20/2021. FINDINGS: MRI HEAD FINDINGS Mild intermittent motion degradation. Brain: Mild generalized cerebral atrophy. Mild-to-moderate multifocal T2 FLAIR hyperintense signal abnormality within the cerebral white matter, nonspecific but compatible with chronic small vessel ischemic disease.  There is no acute infarct. No evidence of an intracranial mass. No chronic intracranial blood products. No extra-axial fluid collection. No midline shift. Vascular: Maintained flow voids within the proximal large arterial vessels. Skull and upper cervical spine: No focal suspicious marrow lesion. Sinuses/Orbits: No mass or acute finding within the imaged orbits. Prior bilateral ocular lens replacement. Minimal mucosal thickening scattered within the bilateral ethmoid air cells. Other: Trace fluid within the left mastoid air cells. MRI CERVICAL SPINE FINDINGS Intermittently motion degraded examination, limiting evaluation. Most notably, there is moderate motion degradation of the sagittal T2 FSE sequence, moderate to severe motion degradation of the axial T2 FSE sequence and moderate to severe motion degradation of the axial T2 GRE sequence. Alignment: Straightening of the expected cervical lordosis. Mild cervical levocurvature. No significant spondylolisthesis. Vertebrae: Vertebral body height is maintained. No significant marrow edema or focal suspicious osseous lesion. Cord: Within limitations of motion degradation, no signal abnormality is identified within the cervical spinal cord. Posterior Fossa, vertebral arteries, paraspinal tissues: Posterior fossa better assessed on concurrently performed brain MRI. Flow voids preserved within the imaged cervical vertebral arteries. No appreciable paraspinal mass or collection. Disc levels: Multilevel disc degeneration, greatest at C5-C6 (moderate) and C6-C7 (mild-to-moderate). C2-C3: Mild facet arthrosis and ligamentum flavum thickening. No significant disc herniation or stenosis. C3-C4: Uncovertebral hypertrophy on the right. Mild facet arthrosis and ligamentum flavum thickening. No significant spinal canal stenosis. Mild-to-moderate right neural foraminal narrowing C4-C5: Mild facet arthrosis. No significant disc herniation or stenosis. C5-C6: Shallow disc bulge.  Uncovertebral hypertrophy (predominantly on the right). The disc bulge mildly effaces the ventral thecal sac, and contacts the ventral spinal cord. Bilateral neural foraminal narrowing (moderate right, mild left). C6-C7: Shallow disc bulge. Mild facet arthrosis. No significant spinal canal or foraminal stenosis. C7-T1: No significant disc herniation or stenosis. IMPRESSION: MRI brain: 1. No evidence of acute intracranial abnormality. 2. Mild-to-moderate chronic small vessel ischemic changes within the  cerebral white matter, similar to the prior brain MRI of 12/14/2021. 3. Mild generalized cerebral atrophy. MRI cervical spine: 1. Significantly motion degraded examination. 2. Cervical spondylosis, as outlined. No more than mild relative spinal canal narrowing. Multilevel foraminal stenosis, as detailed and greatest on the right at C3-C4 (mild-to-moderate) and on the right at C5-C6 (moderate). Disc degeneration is greatest at C5-C6 (moderate) and C6-C7 (mild-to-moderate). 3. Straightening of the expected cervical lordosis. 4. Mild cervical levocurvature. Electronically Signed   By: Kellie Simmering D.O.   On: 05/27/2022 13:54   MR BRAIN WO CONTRAST  Result Date: 05/27/2022 CLINICAL DATA:  Provided history: Neuro deficit, acute, stroke suspected. Ataxia, nontraumatic, cervical pathology suspected. Additional history provided: Hypercapnia associated with chronic respiratory failure secondary to neuromuscular disorder. EXAM: MRI HEAD WITHOUT CONTRAST MRI CERVICAL SPINE WITHOUT CONTRAST TECHNIQUE: Multiplanar, multiecho pulse sequences of the brain and surrounding structures, and cervical spine, to include the craniocervical junction and cervicothoracic junction, were obtained without intravenous contrast. COMPARISON:  Brain MRI 12/14/2021. Cervical spine MRI 11/20/2021. FINDINGS: MRI HEAD FINDINGS Mild intermittent motion degradation. Brain: Mild generalized cerebral atrophy. Mild-to-moderate multifocal T2 FLAIR  hyperintense signal abnormality within the cerebral white matter, nonspecific but compatible with chronic small vessel ischemic disease. There is no acute infarct. No evidence of an intracranial mass. No chronic intracranial blood products. No extra-axial fluid collection. No midline shift. Vascular: Maintained flow voids within the proximal large arterial vessels. Skull and upper cervical spine: No focal suspicious marrow lesion. Sinuses/Orbits: No mass or acute finding within the imaged orbits. Prior bilateral ocular lens replacement. Minimal mucosal thickening scattered within the bilateral ethmoid air cells. Other: Trace fluid within the left mastoid air cells. MRI CERVICAL SPINE FINDINGS Intermittently motion degraded examination, limiting evaluation. Most notably, there is moderate motion degradation of the sagittal T2 FSE sequence, moderate to severe motion degradation of the axial T2 FSE sequence and moderate to severe motion degradation of the axial T2 GRE sequence. Alignment: Straightening of the expected cervical lordosis. Mild cervical levocurvature. No significant spondylolisthesis. Vertebrae: Vertebral body height is maintained. No significant marrow edema or focal suspicious osseous lesion. Cord: Within limitations of motion degradation, no signal abnormality is identified within the cervical spinal cord. Posterior Fossa, vertebral arteries, paraspinal tissues: Posterior fossa better assessed on concurrently performed brain MRI. Flow voids preserved within the imaged cervical vertebral arteries. No appreciable paraspinal mass or collection. Disc levels: Multilevel disc degeneration, greatest at C5-C6 (moderate) and C6-C7 (mild-to-moderate). C2-C3: Mild facet arthrosis and ligamentum flavum thickening. No significant disc herniation or stenosis. C3-C4: Uncovertebral hypertrophy on the right. Mild facet arthrosis and ligamentum flavum thickening. No significant spinal canal stenosis. Mild-to-moderate  right neural foraminal narrowing C4-C5: Mild facet arthrosis. No significant disc herniation or stenosis. C5-C6: Shallow disc bulge. Uncovertebral hypertrophy (predominantly on the right). The disc bulge mildly effaces the ventral thecal sac, and contacts the ventral spinal cord. Bilateral neural foraminal narrowing (moderate right, mild left). C6-C7: Shallow disc bulge. Mild facet arthrosis. No significant spinal canal or foraminal stenosis. C7-T1: No significant disc herniation or stenosis. IMPRESSION: MRI brain: 1. No evidence of acute intracranial abnormality. 2. Mild-to-moderate chronic small vessel ischemic changes within the cerebral white matter, similar to the prior brain MRI of 12/14/2021. 3. Mild generalized cerebral atrophy. MRI cervical spine: 1. Significantly motion degraded examination. 2. Cervical spondylosis, as outlined. No more than mild relative spinal canal narrowing. Multilevel foraminal stenosis, as detailed and greatest on the right at C3-C4 (mild-to-moderate) and on the right at C5-C6 (moderate). Disc degeneration  is greatest at C5-C6 (moderate) and C6-C7 (mild-to-moderate). 3. Straightening of the expected cervical lordosis. 4. Mild cervical levocurvature. Electronically Signed   By: Kellie Simmering D.O.   On: 05/27/2022 13:54   DG Sniff Test  Result Date: 05/26/2022 CLINICAL DATA:  Acute on chronic shortness of breath EXAM: CHEST FLUOROSCOPY TECHNIQUE: Real-time fluoroscopic evaluation of the chest was performed. FLUOROSCOPY: Radiation Exposure Index (as provided by the fluoroscopic device): 2.1 mGy Kerma COMPARISON:  None Available. FINDINGS: Breathing observed fluoroscopically with quiet respiration, deep breathing, and sharp inspiration. Lung volumes are low at baseline. There is poor diaphragmatic excursion. The study was technically limited by patient coughing. IMPRESSION: Technically limited study. Poor diaphragmatic excursion suggesting at least weakness. Electronically Signed    By: Macy Mis M.D.   On: 05/26/2022 16:17    Scheduled Meds:  amLODipine  10 mg Oral Daily   arformoterol  15 mcg Nebulization BID   budesonide (PULMICORT) nebulizer solution  0.5 mg Nebulization BID   dextromethorphan-guaiFENesin  1 tablet Oral BID   DULoxetine  30 mg Oral Daily   levothyroxine  88 mcg Oral Daily   montelukast  10 mg Oral QHS   pantoprazole  40 mg Oral Daily   predniSONE  40 mg Oral Q breakfast   Followed by   Derrill Memo ON 05/31/2022] predniSONE  30 mg Oral Q breakfast   Followed by   Derrill Memo ON 06/03/2022] predniSONE  20 mg Oral Q breakfast   Followed by   Derrill Memo ON 06/06/2022] predniSONE  10 mg Oral Q breakfast   rivaroxaban  10 mg Oral Q supper   sucralfate  1 g Oral QID   Continuous Infusions:   LOS: 3 days   Shelly Coss, MD [facility] P6/29/2023, 1:30 PM

## 2022-05-28 NOTE — Progress Notes (Signed)
Speech Language Pathology Treatment: Dysphagia  Patient Details Name: Rebecca Osborn MRN: [id number] DOB: 05-29-1941 Today's Date: 05/28/2022 Time: 7262-0355 SLP Time Calculation (min) (ACUTE ONLY): 29 min  Assessment / Plan / Recommendation Clinical Impression  Pt seen for ongoing dysphagia management. RN reports pt did well with medication.  Pt did not have AM meal, because it did not meet halal specifications.  Diet orders modified in comments to reinforce halal diet.  Today pt tolerated regular texture solids, puree, and thin liquid by cup and straw without overt s/s of aspiration directly related to POs.  Pt has hx of reflux and takes reflux meds.  Pt with ENT visit with [facility] provider in January of this year with laryngoscopy revealing "mild left NSD, no purulence or polyps, normal NP with patent eustachian tube orifices, normal BOT and epiglottis, moderate interarytenoid edema with normal mobility bilaterally, TVCs with normal mobility and a mid glottic gap on phonation, hypopharynx clear." Recommendations for additional medication for drainage, which pharmacy did not provide and so she has not been taking it, and to continue reflux management.  Findings of glottic gap may be concerning for incomplete airway protection in the even that penetration occurs. Family reports coughing and strangling with liquids at time.  Today prior to initiation of PO intake there was throat clearing related to secretion management.  Pt had clearing of vocal quality but also felt that she was never fully able to clear, noting stasis at the thyroid cartilage.  This was not relieve with bolus trials to try to help clear through swallowing.  Family reports some dysarthria coming on with her generalized weakness.  She states she has to put extra force in to talk.  Family and pt would like to proceed with modified as discussed yesterday with evaluating therapist.  Given ongoing subjective difficulty swallowing, feeling of  stasis, and finding of glottal gap per ENT, will schedule MBSS for next date.   Pt may continue regular texture diet with thin liquid, pending results of instrumental assessment.     HPI HPI: Rebecca Osborn is a 81 y.o. female who has a PMH as outlined below including but not limited to asthma for which she follows with Dr. Verlee Monte in our clinic.  She presented for routine follow-up 6/26 per Dr. Melvyn Novas notes, her DOE was worse compared to the previous visit to the point she was getting dyspneic with minimal exertion.  She was also experiencing greater orthopnea than before.  Has had ongoing dry cough.  Denies fevers or chills/sweats.  Has had mild pleuritic chest pain centrally located, nonradiating.  Family has also noticed facial edema over past week or so, no obvious LE edema. Apparently her voice is also changed in the past 3 to 4 months getting much weaker.  She has had generalized weakness over the same time.  To where she can barely ambulate without getting dyspneic and O2 levels falling into the low 80s.  Family checks her O2 levels frequently to keep trend and track of things.  Prior to her pulmonary follow-up 6/26, they noted that her O2 levels were as low as 84 on room air. Family states over past 4 months or so, she is quite sedentary, sits on couch most of the day and watches TV. They have noticed that she gets sleepy very easily and often doses off during TV shows or even sometimes during the odd conversation; CXR on 05/26/22 indicated Low volume chest with atelectasis or infiltrate at the bases. Pt placed  on Bipap overnight, but is currently on 4L O2.Family reports difficulty with liquids intermittently and xerostomia. 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 BSE generated.      SLP Plan  MBS      Recommendations for follow up therapy are one component of a multi-disciplinary discharge planning  process, led by the attending physician.  Recommendations may be updated based on patient status, additional functional criteria and insurance authorization.    Recommendations  Diet recommendations: Regular;Thin liquid Liquids provided via: Cup;Straw Medication Administration: Whole meds with puree Supervision: Patient able to self feed Compensations: Slow rate;Small sips/bites Postural Changes and/or Swallow Maneuvers: Seated upright 90 degrees;Upright 30-60 min after meal                Oral Care Recommendations: Oral care BID;Staff/trained caregiver to provide oral care Follow Up Recommendations:  (TBD) Assistance recommended at discharge: Intermittent Supervision/Assistance SLP Visit Diagnosis: Dysphagia, pharyngoesophageal phase (R13.14) Plan: [place], [place], [place] Office: [phone] 05/28/2022, 12:20 PM

## 2022-05-28 NOTE — Care Management Important Message (Signed)
Important Message  Patient Details  Name: Rebecca Osborn MRN: [id number] Date of Birth: [birth year]   Medicare Important Message Given:  Yes     Shelda Altes 05/28/2022, 8:28 AM

## 2022-05-28 NOTE — Progress Notes (Signed)
Placed patient on bi-pap. O2 sat 100%/ tolerating well, lethargic, answers questions. Daughter by bedside. Continue to monitor.

## 2022-05-28 NOTE — Progress Notes (Signed)
Blood arterial gas CO2 critical at 80.1 received. MD notified.  Natale Milch, RN

## 2022-05-28 NOTE — Progress Notes (Signed)
RT at bedside for morning breathing tx. RT found pt on 3L [place] and BiPAP on stby. RT will continue to monitor pt.

## 2022-05-28 NOTE — Progress Notes (Signed)
BIPAP on standby at this time.  Patient stable on 4L [place] O2 sat 97%.  Does not wish to use BIPAP at this time.  RT will continue to monitor.

## 2022-05-29 ENCOUNTER — Inpatient Hospital Stay (HOSPITAL_BASED_OUTPATIENT_CLINIC_OR_DEPARTMENT_OTHER): Payer: Medicare Other

## 2022-05-29 DIAGNOSIS — R1312 Dysphagia, oropharyngeal phase: Secondary | ICD-10-CM | POA: Diagnosis not present

## 2022-05-29 DIAGNOSIS — J9602 Acute respiratory failure with hypercapnia: Secondary | ICD-10-CM | POA: Diagnosis not present

## 2022-05-29 DIAGNOSIS — J9601 Acute respiratory failure with hypoxia: Secondary | ICD-10-CM | POA: Diagnosis not present

## 2022-05-29 MED ORDER — PREDNISONE 10 MG PO TABS: 10.0000 mg | ORAL_TABLET | Freq: Every day | ORAL | 0 refills | Status: AC

## 2022-05-29 MED ORDER — PREDNISONE 20 MG PO TABS: 20.0000 mg | ORAL_TABLET | Freq: Every day | ORAL | 0 refills | Status: AC

## 2022-05-29 MED ORDER — PREDNISONE 10 MG PO TABS: 30.0000 mg | ORAL_TABLET | Freq: Every day | ORAL | 0 refills | Status: AC

## 2022-05-29 MED ORDER — PREDNISONE 20 MG PO TABS: 40.0000 mg | ORAL_TABLET | Freq: Every day | ORAL | 0 refills | Status: AC

## 2022-05-29 NOTE — TOC Transition Note (Signed)
Transition of Care (TOC) - CM/SW Discharge Note Marvetta Gibbons RN, BSN Transitions of Care Unit 4E- RN Case Manager See Treatment Team for direct phone #    Patient Details  Name: Rebecca Osborn MRN: [id number] Date of Birth: 1941-06-06  [facility]) CM/SW Contact:  Dawayne Patricia, RN Phone Number: 05/29/2022, 3:51 PM   Clinical Narrative:    Received word from Thedore Mins w/ [employer] that they have received insurance auth for home NIV. [employer] will also set up home 02 and are working getting in touch with family to set up delivery to the home for both NIV and home 02 needs.  [employer] will plan to see pt in the home for NIV education later today with their RT team.   Msg has been sent to attending and plan will be to transition pt home today.  Family to transport home.    Final next level of care: Home/Self Care Barriers to Discharge: Barriers Resolved   Patient Goals and CMS Choice Patient states their goals for this hospitalization and ongoing recovery are:: returnh home   Choice offered to / list presented to : Patient  Discharge Placement               Home        Discharge Plan and Services   Discharge Planning Services: CM Consult Post Acute Care Choice: Durable Medical Equipment          DME Arranged: Oxygen, NIV DME Agency: [employer] Date DME Agency Contacted: 05/29/22 Time DME Agency Contacted: [id number] Representative spoke with at DME Agency: [place]: NA [place] Agency: NA        Social Determinants of Health ([place]) Interventions     Readmission Risk Interventions    05/29/2022    3:51 PM  Readmission Risk Prevention Plan  Post Dischage Appt Complete  Medication Screening Complete  Transportation Screening Complete

## 2022-05-29 NOTE — Progress Notes (Signed)
SATURATION QUALIFICATIONS: (This note is used to comply with regulatory documentation for home oxygen)  Patient Saturations on Room Air at Rest = 91%  Patient Saturations on Room Air while Ambulating = 85%  Patient Saturations on 1 Liters of oxygen while Ambulating = 92%  Please briefly explain why patient needs home oxygen: Pt tolerated ambulation on RA well initially then dropped to 85% with a good PLETH as distance progressed and pt fatigued.    Rebecca Osborn. PTA Acute Rehabilitation Services Office: [phone]

## 2022-05-29 NOTE — TOC Progression Note (Signed)
Transition of Care (TOC) - Progression Note  Marvetta Gibbons RN, BSN Transitions of Care Unit 4E- RN Case Manager See Treatment Team for direct phone #    Patient Details  Name: Rebecca Osborn MRN: [id number] Date of Birth: 23-Oct-1941  [facility]) CM/SW Contact  Dahlia Client, Romeo Rabon, RN Phone Number: 05/29/2022, 11:35 AM  Clinical Narrative:    CM faxed signed NIV order form to [employer] this am, [employer] working on approval, auth pending at this time. Noted order for home 02 - [employer] will also process home 02 needs- pending documentation of qualifying 02 note for home 02 needs.      Barriers to Discharge: Continued Medical Work up  Expected Discharge Plan and Services                           DME Arranged: NIV DME Agency: [employer] Date DME Agency Contacted: 05/27/22 Time DME Agency Contacted: 74 Representative spoke with at DME Agency: Thedore Mins             Social Determinants of Health ([place]) Interventions    Readmission Risk Interventions     No data to display

## 2022-05-29 NOTE — Progress Notes (Signed)
RT at bedside to give morning breathing tx, to find pt on 3L [place]. Family stated she would like for the pt to be placed back on BiPAP before breakfast. RT will continue to monitor pt.

## 2022-05-29 NOTE — Progress Notes (Signed)
Pt placed on BIPAP for night rest.  No respiratory distress noted at this time.  Daughter is a bedside and will have RN call RT if there is concern with the BIPAP.

## 2022-05-29 NOTE — Progress Notes (Signed)
Speech Language Pathology Treatment: Dysphagia  Patient Details Name: Rebecca Osborn MRN: [id number] DOB: 1941/01/23 Today's Date: 05/29/2022 Time: 0623-7628 SLP Time Calculation (min) (ACUTE ONLY): 56 min  Assessment / Plan / Recommendation Clinical Impression  Reviewed results of MBS in room with daughter and pt.  Explained rationale for modified diet.  Pt participated in trial exercises. Pt demonstrated ability to complete CTAR and effortful swallow with good effort and accuracy.  Pt attempted Masko with excellent effort but was unable to execute maneuver.  Pt completed mendelssohn with good effort and fair accuracy.  Pt given EMST lite trainer.  Pt able to trigger and 5ccH20 on 1 of 2 trials. Pt is a very good candidate for swallow therapy given her motivation and ability to participate in HEP, but still exhibits weakness.  She would benefit from home health ST for implementation of exercise program and diet tolerance.  Pt would also benefit from repeat MBS follow completion of course of treatment to determine if pt can safely advance to thin liquids.  Provided exercise demonstration and handout for education.  Discussed free water protocol with pt and daughter for comfort and to improve hydration.  Pt c/o of dryness, especially with use of BiPAP.  Pt also complains of feeling of stasis of secretions.  Provided oral suction catheter.  Family would like suction set up at home.  Education provided verbally and with handout.  Provided swallowing with respiratory issues handout and briefly discussed recommendations for safe swallow.  Provided education verbally regarding esophageal dysphagia precautions.  Recommended OP GI follow up for reflux medication management and possible esophageal assessment. Provided thickener education and thickener starter kit to pt/family.  Xanthan gum and corn starch based thickeners do fit into halal specifications, but any products with gelatin should be avoided as they may  contain animal by-products.  Pt and daughter verbalized understanding regarding plan of care and next steps.      HPI HPI: Rebecca Osborn is a 81 y.o. female who has a PMH as outlined below including but not limited to asthma for which she follows with Dr. Verlee Monte in our clinic.  She presented for routine follow-up 6/26 per Dr. Melvyn Novas notes, her DOE was worse compared to the previous visit to the point she was getting dyspneic with minimal exertion.  She was also experiencing greater orthopnea than before.  Has had ongoing dry cough.  Denies fevers or chills/sweats.  Has had mild pleuritic chest pain centrally located, nonradiating.  Family has also noticed facial edema over past week or so, no obvious LE edema. Apparently her voice is also changed in the past 3 to 4 months getting much weaker.  She has had generalized weakness over the same time.  To where she can barely ambulate without getting dyspneic and O2 levels falling into the low 80s.  Family checks her O2 levels frequently to keep trend and track of things.  Prior to her pulmonary follow-up 6/26, they noted that her O2 levels were as low as 84 on room air. Family states over past 4 months or so, she is quite sedentary, sits on couch most of the day and watches TV. They have noticed that she gets sleepy very easily and often doses off during TV shows or even sometimes during the odd conversation; CXR on 05/26/22 indicated Low volume chest with atelectasis or infiltrate at the bases. Pt placed on Bipap overnight, but is currently on 4L O2.Family reports difficulty with liquids intermittently and xerostomia. 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 BSE generated.      SLP Plan  Continue with current plan of care      Recommendations for follow up therapy are one component of a multi-disciplinary discharge planning process, led by the attending physician.   Recommendations may be updated based on patient status, additional functional criteria and insurance authorization.    Recommendations  Diet recommendations: Regular;Nectar-thick liquid Liquids provided via: Cup Medication Administration: Whole meds with puree Supervision: Patient able to self feed Compensations: Slow rate;Small sips/bites;Follow solids with liquid Postural Changes and/or Swallow Maneuvers: Seated upright 90 degrees;Upright 30-60 min after meal                Oral Care Recommendations: Oral care BID;Staff/trained caregiver to provide oral care Follow Up Recommendations: Home health SLP Assistance recommended at discharge: Intermittent Supervision/Assistance SLP Visit Diagnosis: Dysphagia, oropharyngeal phase (R13.12);Dysphagia, pharyngoesophageal phase (R13.14) Plan: Continue with current plan of care           Celedonio Savage, [place], [place] Office: [phone] 05/29/2022, 11:41 AM

## 2022-05-29 NOTE — Discharge Summary (Signed)
Physician Discharge Summary  Rebecca Osborn YPP:[id number] DOB: 23-Oct-1941 DOA: 05/25/2022  PCP: Windell Hummingbird, PA-C  Admit date: 05/25/2022 Discharge date: 05/29/2022  Admitted From: Home Disposition:  Home  Discharge Condition:Stable CODE STATUS:FULL Diet recommendation: Heart Healthy   Brief/Interim Summary:  Patient is a 81 year old female with past medical history of asthma, arthritis, back pain, hypothyroidism, hypertension who was admitted for worsening shortness of breath, orthopnea, dry cough, stridor, becoming more weak.  She was sent to the ED from pulmonary clinic.  ABG in the emergency department showed CO2 of 98, pH of 7.19.  She was placed on BiPAP, critical care consulted and was admitted.  Patient's mental status has improved and she has been transferred to our service on 6/29. 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  Overall status has significantly improved.  She qualified for home oxygen.  As per pulmonology recommendation, NIV arranged for home as well as oxygen.  She will follow-up with pulmonology as an outpatient.  We also recommend outpatient neurology follow-up.   Following problems were addressed during her hospitalization:  Acute hypoxic/hypercarbic respiratory failure: Unclear etiology but suspected to be from diaphragmatic paralysis.  History of asthma, follows with Dr. Verlee Monte as an outpatient.  Viral panel negative.  Sniff test on 6/27 showed minimal diaphragm movement on the left.  MRI of the brain/cervical spine did not show any acute abnormalities but showed cervical spondylosis, multilevel foraminal stenosis. PCCM recommending NIV support on discharge for chronic hypercapnic respiratory failure. Last ABG showed pH of 7.37, PCO2 of 81. We also recommend outpatient neurology evaluation, consideration of EMG. She was on 2-3 L of oxygen this morning.  Not on oxygen at home.  She qualified for home oxygen, will need  1 L of  oxygen per minute as per PT note.   Follow-up with her own pulmonologist in a week.  She will be discharged on tapering dose of prednisone.  NIV arranged   History of asthma: Follows with pulmonology as an outpatient.  Continue home inhaler   Generalized weakness: This was most likely contributed by hypercarbia.  PT/OT recommended no follow up on discharge.  Currently alert and oriented.   Upper airway stridor: Presented with history of dysphagia/voice change.  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  On PPI, Pepcid, sucralfate at home.  Started  on slow steroid taper   Diastolic congestive heart failure: Echo done here showed EF of 60-65 percent, grade 1 diastolic dysfunction.  Currently she is euvolemic   Hypothyroidism: Continue Synthyroid   Hypertension: Continue amlodipine    Discharge Diagnoses:  Principal Problem:   Respiratory failure with hypoxia and hypercapnia (HCC) Active Problems:   Acute dyspnea   Hypercapnia    Discharge Instructions  Discharge Instructions     Ambulatory referral to Neurology   Complete by: As directed    An appointment is requested in approximately: 2 weeks   Diet - low sodium heart healthy   Complete by: As directed    Discharge instructions   Complete by: As directed    1)Please take prescribed medications as instructed 2)Follow up with your pulmonologist in a week 3)Follow up with neurology as an outpatient in 2-4 weeks. Name and number of the provider group has been attached.call for appointment 4)Follow up with your PCP in a week   Increase activity slowly   Complete by: As directed       Allergies as of 05/29/2022       Reactions   Penicillins Hives  Pork-derived Products Other (See Comments)   Religious reasons - prefers not to use any pork products.    Nsaids Nausea Only   GI upset         Medication List     STOP taking these medications    ALPRAZolam 0.5 MG tablet Commonly known as: Xanax       TAKE these medications     acetaminophen 650 MG CR tablet Commonly known as: TYLENOL Take 650 mg by mouth at bedtime as needed for pain.   albuterol 108 (90 Base) MCG/ACT inhaler Commonly known as: VENTOLIN HFA Inhale 1-2 puffs into the lungs every 4 (four) hours as needed for wheezing or shortness of breath.   albuterol (2.5 MG/3ML) 0.083% nebulizer solution Commonly known as: PROVENTIL Take 2.5 mg by nebulization every 6 (six) hours as needed for wheezing or shortness of breath.   amLODipine 10 MG tablet Commonly known as: NORVASC Take 10 mg by mouth daily.   Breztri Aerosphere 160-9-4.8 MCG/ACT Aero Generic drug: Budeson-Glycopyrrol-Formoterol Inhale 2 puffs into the lungs in the morning and at bedtime.   cetirizine 10 MG tablet Commonly known as: ZYRTEC Take 10 mg by mouth daily as needed for allergies.   dexlansoprazole 60 MG capsule Commonly known as: DEXILANT Take 60 mg by mouth daily.   diclofenac Sodium 1 % Gel Commonly known as: VOLTAREN Apply 2-4 g topically 4 (four) times daily as needed (joint pain).   DULoxetine 30 MG capsule Commonly known as: Cymbalta Take 1 capsule (30 mg total) by mouth daily.   famotidine 20 MG tablet Commonly known as: PEPCID Take 20 mg by mouth at bedtime as needed for heartburn or indigestion.   fluticasone 50 MCG/ACT nasal spray Commonly known as: FLONASE Place 1 spray into both nostrils daily.   levothyroxine 88 MCG tablet Commonly known as: SYNTHROID Take 88 mcg by mouth daily.   LIDOCAINE EX Apply 1 patch topically daily as needed (stomach pain).   montelukast 10 MG tablet Commonly known as: SINGULAIR Take 10 mg by mouth at bedtime.   multivitamin with minerals tablet Take 1 tablet by mouth daily.   oxyCODONE-acetaminophen 5-325 MG tablet Commonly known as: PERCOCET/ROXICET Take 0.25-0.5 tablets by mouth daily as needed for moderate pain.   predniSONE 20 MG tablet Commonly known as: DELTASONE Take 2 tablets (40 mg total) by mouth  daily with breakfast for 1 day. Start taking on: May 30, 2022   predniSONE 10 MG tablet Commonly known as: DELTASONE Take 3 tablets (30 mg total) by mouth daily with breakfast for 3 days. Start taking on: May 31, 2022   predniSONE 20 MG tablet Commonly known as: DELTASONE Take 1 tablet (20 mg total) by mouth daily with breakfast for 3 days. Start taking on: June 03, 2022   predniSONE 10 MG tablet Commonly known as: DELTASONE Take 1 tablet (10 mg total) by mouth daily with breakfast for 3 days. Start taking on: June 06, 2022   Rollator Ultra-Light Misc Rollator style walker   rOPINIRole 0.5 MG tablet Commonly known as: REQUIP Take 0.5 mg by mouth at bedtime as needed (restless legs/cramps).   sodium chloride HYPERTONIC 3 % nebulizer solution Take by nebulization as needed for other. Please file under diagnosis asthma J45.909 What changed:  how much to take when to take this reasons to take this   Spacer/Aero-Holding Dorise Bullion 1 each by Does not apply route in the morning and at bedtime. Please use with MDI inhalers   sucralfate 1 g  tablet Commonly known as: CARAFATE Take 1 g by mouth 4 (four) times daily.   Trulance 3 MG Tabs Generic drug: Plecanatide Take 3 mg by mouth daily.               Durable Medical Equipment  (From admission, onward)           Start     Ordered   05/29/22 1321  For home use only DME oxygen  Once       Question Answer Comment  Length of Need Lifetime   Mode or (Route) Nasal cannula   Liters per Minute 1   Frequency Continuous (stationary and portable oxygen unit needed)   Oxygen delivery system Gas      05/29/22 1320            Follow-up Information     Maryjane Hurter, MD. Schedule an appointment as soon as possible for a visit in 1 week(s).   Specialty: Pulmonary Disease Contact information: [address] [place] [address] [phone]         [facility]. Schedule an  appointment as soon as possible for a visit in 2 week(s).   Specialty: Neurology Contact information: [address] [place] [phone]        Windell Hummingbird, PA-C. Schedule an appointment as soon as possible for a visit in 1 week(s).   Specialty: Physician Assistant Contact information: [address] [place] [address] [phone]         [facility] Follow up.   Why: Home NIV and home 02 arranged- they will contact you for delivery and have RT come to home for education on day of discharge. Contact information: [address] [place] [address] [phone]                Allergies  Allergen Reactions   Penicillins Hives   Pork-Derived Products Other (See Comments)    Religious reasons - prefers not to use any pork products.    Nsaids Nausea Only    GI upset     Consultations: PCCM   Procedures/Studies: DG Swallowing Func-Speech Pathology  Result Date: 05/29/2022 Table formatting from the original result was not included. Objective Swallowing Evaluation: Type of Study: MBS-Modified Barium Swallow Study  Patient Details Name: PRESCIOUS HURLESS MRN: [id number] Date of Birth: 07-Jan-1941 Today's Date: 05/29/2022 Time: SLP Start Time (ACUTE ONLY): 0906 -SLP Stop Time (ACUTE ONLY): 3546 SLP Time Calculation (min) (ACUTE ONLY): 16 min Past Medical History: Past Medical History: Diagnosis Date  Arthritis   Asthma   Constipation   Hypertension   Thyroid disease  Past Surgical History: Past Surgical History: Procedure Laterality Date  APPENDECTOMY    BACK SURGERY    JOINT REPLACEMENT    KNEE ARTHROSCOPY   HPI: MADIE CAHN is a 81 y.o. female who has a PMH as outlined below including but not limited to asthma for which she follows with Dr. Verlee Monte in our clinic.  She presented for routine follow-up 6/26 per Dr. Melvyn Novas notes, her DOE was worse compared to the previous visit to the point she was getting dyspneic with  minimal exertion.  She was also experiencing greater orthopnea than before.  Has had ongoing dry cough.  Denies fevers or chills/sweats.  Has had mild pleuritic chest pain centrally located, nonradiating.  Family has also noticed facial edema over past week or so, no obvious LE edema. Apparently her voice is  also changed in the past 3 to 4 months getting much weaker.  She has had generalized weakness over the same time.  To where she can barely ambulate without getting dyspneic and O2 levels falling into the low 80s.  Family checks her O2 levels frequently to keep trend and track of things.  Prior to her pulmonary follow-up 6/26, they noted that her O2 levels were as low as 84 on room air. Family states over past 4 months or so, she is quite sedentary, sits on couch most of the day and watches TV. They have noticed that she gets sleepy very easily and often doses off during TV shows or even sometimes during the odd conversation; CXR on 05/26/22 indicated Low volume chest with atelectasis or infiltrate at the bases. Pt placed on Bipap overnight, but is currently on 4L O2.Family reports difficulty with liquids intermittently and xerostomia. 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 BSE generated.  Subjective: Family stated pt "clears her throat a lot"  Recommendations for follow up therapy are one component of a multi-disciplinary discharge planning process, led by the attending physician.  Recommendations may be updated based on patient status, additional functional criteria and insurance authorization. Assessment / Plan / Recommendation   05/29/2022  11:25 AM Clinical Impressions Clinical Impression Pt presents with a moderate oropharyngeal dysphagia c/b delayed swallow initiation, reduced anterior hyolaryngeal excursion, incomplete laryngeal closure, and diminshed sensation. Of note, ENT evaluation in January of this year  by with laryngoscopy noted a glottal gap, which may also contribute to dysphagia, but could not be visualized on MBSS.  These deficits resulted in silent aspiration of thin liquid by cup and straw during the swallow.  Cued cough was very weak and insufficient to clear aspiration.  Pt's swallow function showed more impairment across study suggesting fatigue may play a role.  With nectar thick liquid there was only very shallow, trace, transient penetration.  There was no penetration of puree or solid.  With pill simulation, pt was unable to transit tablet orally with water. Pt swallowed tablet with puree.  There was stasis of tablet in upper esophagus.  Pt benefited from additional bites of puree to help tablet move through esophagus.  Stasis was noted again near the GE juction, but appeared to clear eventually.  There was intermittent stasis of contrast in cervical esophagus around the location of possible cervical osteophyes which impinge on esophagus (5-7). Pt may benefit from further assessment of esophageal swallow function.  Consider GI consult.  Recommend regular diet with nectar thick liquids. Pt my have water in moderation, in between meals, after good oral care, when fully awake/alert, with upright positioning and supervision. SLP Visit Diagnosis Dysphagia, oropharyngeal phase (R13.12);Dysphagia, pharyngoesophageal phase (R13.14) Impact on safety and function Moderate aspiration risk     05/29/2022  11:25 AM Treatment Recommendations Treatment Recommendations Therapy as outlined in treatment plan below     05/29/2022  11:35 AM Prognosis Prognosis for Safe Diet Advancement Good   05/29/2022  11:25 AM Diet Recommendations SLP Diet Recommendations Regular solids;Nectar thick liquid Liquid Administration via Cup Medication Administration Whole meds with puree Compensations Slow rate;Small sips/bites;Follow solids with liquid Postural Changes Remain semi-upright after after feeds/meals (Comment);Seated upright at  90 degrees     05/29/2022  11:25 AM Other Recommendations Recommended Consults Consider GI evaluation;Consider esophageal assessment Oral Care Recommendations Oral care BID Other Recommendations Order thickener from pharmacy;Have oral suction  available Follow Up Recommendations Home health SLP Assistance recommended at discharge Intermittent Supervision/Assistance Functional Status Assessment Patient has had a recent decline in their functional status and demonstrates the ability to make significant improvements in function in a reasonable and predictable amount of time.   05/29/2022  11:25 AM Frequency and Duration  Speech Therapy Frequency (ACUTE ONLY) min 2x/week Treatment Duration 2 weeks     05/29/2022  11:18 AM Oral Phase Oral Phase Impaired Oral - Nectar Cup [facility] Oral - Thin Cup Premature spillage Oral - Thin Straw Premature spillage Oral - Puree WFL Oral - Regular Premature spillage Oral - Pill [facility]    05/29/2022  11:22 AM Pharyngeal Phase Pharyngeal Phase Impaired Pharyngeal- Nectar Cup Delayed swallow initiation-vallecula;Penetration/Aspiration during swallow Pharyngeal Material enters airway, remains ABOVE vocal cords then ejected out Pharyngeal- Thin Cup Delayed swallow initiation-pyriform sinuses;Reduced airway/laryngeal closure;Penetration/Aspiration during swallow;Trace aspiration;Reduced anterior laryngeal mobility Pharyngeal Material enters airway, passes BELOW cords without attempt by patient to eject out (silent aspiration) Pharyngeal- Thin Straw Delayed swallow initiation-pyriform sinuses Pharyngeal Material enters airway, passes BELOW cords without attempt by patient to eject out (silent aspiration) Pharyngeal- Puree WFL Pharyngeal Material does not enter airway Pharyngeal- Regular Delayed swallow initiation-vallecula Pharyngeal Material does not enter airway Pharyngeal- Pill WFL Pharyngeal Material does not enter airway    05/29/2022  11:25 AM Cervical Esophageal Phase  Cervical Esophageal Phase  Impaired Cervical Esophageal Comment retention of contrast in cervical esophagus Celedonio Savage, MA, CCC-SLP Acute Rehabilitation Services Office: [phone] 05/29/2022, 11:38 AM                     MR CERVICAL SPINE WO CONTRAST  Result Date: 05/27/2022 CLINICAL DATA:  Provided history: Neuro deficit, acute, stroke suspected. Ataxia, nontraumatic, cervical pathology suspected. Additional history provided: Hypercapnia associated with chronic respiratory failure secondary to neuromuscular disorder. EXAM: MRI HEAD WITHOUT CONTRAST MRI CERVICAL SPINE WITHOUT CONTRAST TECHNIQUE: Multiplanar, multiecho pulse sequences of the brain and surrounding structures, and cervical spine, to include the craniocervical junction and cervicothoracic junction, were obtained without intravenous contrast. COMPARISON:  Brain MRI 12/14/2021. Cervical spine MRI 11/20/2021. FINDINGS: MRI HEAD FINDINGS Mild intermittent motion degradation. Brain: Mild generalized cerebral atrophy. Mild-to-moderate multifocal T2 FLAIR hyperintense signal abnormality within the cerebral white matter, nonspecific but compatible with chronic small vessel ischemic disease. There is no acute infarct. No evidence of an intracranial mass. No chronic intracranial blood products. No extra-axial fluid collection. No midline shift. Vascular: Maintained flow voids within the proximal large arterial vessels. Skull and upper cervical spine: No focal suspicious marrow lesion. Sinuses/Orbits: No mass or acute finding within the imaged orbits. Prior bilateral ocular lens replacement. Minimal mucosal thickening scattered within the bilateral ethmoid air cells. Other: Trace fluid within the left mastoid air cells. MRI CERVICAL SPINE FINDINGS Intermittently motion degraded examination, limiting evaluation. Most notably, there is moderate motion degradation of the sagittal T2 FSE sequence, moderate to severe motion degradation of the axial T2 FSE sequence and moderate to severe  motion degradation of the axial T2 GRE sequence. Alignment: Straightening of the expected cervical lordosis. Mild cervical levocurvature. No significant spondylolisthesis. Vertebrae: Vertebral body height is maintained. No significant marrow edema or focal suspicious osseous lesion. Cord: Within limitations of motion degradation, no signal abnormality is identified within the cervical spinal cord. Posterior Fossa, vertebral arteries, paraspinal tissues: Posterior fossa better assessed on concurrently performed brain MRI. Flow voids preserved within the imaged cervical vertebral arteries. No appreciable paraspinal mass or collection. Disc levels: Multilevel disc degeneration,  greatest at C5-C6 (moderate) and C6-C7 (mild-to-moderate). C2-C3: Mild facet arthrosis and ligamentum flavum thickening. No significant disc herniation or stenosis. C3-C4: Uncovertebral hypertrophy on the right. Mild facet arthrosis and ligamentum flavum thickening. No significant spinal canal stenosis. Mild-to-moderate right neural foraminal narrowing C4-C5: Mild facet arthrosis. No significant disc herniation or stenosis. C5-C6: Shallow disc bulge. Uncovertebral hypertrophy (predominantly on the right). The disc bulge mildly effaces the ventral thecal sac, and contacts the ventral spinal cord. Bilateral neural foraminal narrowing (moderate right, mild left). C6-C7: Shallow disc bulge. Mild facet arthrosis. No significant spinal canal or foraminal stenosis. C7-T1: No significant disc herniation or stenosis. IMPRESSION: MRI brain: 1. No evidence of acute intracranial abnormality. 2. Mild-to-moderate chronic small vessel ischemic changes within the cerebral white matter, similar to the prior brain MRI of 12/14/2021. 3. Mild generalized cerebral atrophy. MRI cervical spine: 1. Significantly motion degraded examination. 2. Cervical spondylosis, as outlined. No more than mild relative spinal canal narrowing. Multilevel foraminal stenosis, as  detailed and greatest on the right at C3-C4 (mild-to-moderate) and on the right at C5-C6 (moderate). Disc degeneration is greatest at C5-C6 (moderate) and C6-C7 (mild-to-moderate). 3. Straightening of the expected cervical lordosis. 4. Mild cervical levocurvature. Electronically Signed   By: Kellie Simmering D.O.   On: 05/27/2022 13:54   MR BRAIN WO CONTRAST  Result Date: 05/27/2022 CLINICAL DATA:  Provided history: Neuro deficit, acute, stroke suspected. Ataxia, nontraumatic, cervical pathology suspected. Additional history provided: Hypercapnia associated with chronic respiratory failure secondary to neuromuscular disorder. EXAM: MRI HEAD WITHOUT CONTRAST MRI CERVICAL SPINE WITHOUT CONTRAST TECHNIQUE: Multiplanar, multiecho pulse sequences of the brain and surrounding structures, and cervical spine, to include the craniocervical junction and cervicothoracic junction, were obtained without intravenous contrast. COMPARISON:  Brain MRI 12/14/2021. Cervical spine MRI 11/20/2021. FINDINGS: MRI HEAD FINDINGS Mild intermittent motion degradation. Brain: Mild generalized cerebral atrophy. Mild-to-moderate multifocal T2 FLAIR hyperintense signal abnormality within the cerebral white matter, nonspecific but compatible with chronic small vessel ischemic disease. There is no acute infarct. No evidence of an intracranial mass. No chronic intracranial blood products. No extra-axial fluid collection. No midline shift. Vascular: Maintained flow voids within the proximal large arterial vessels. Skull and upper cervical spine: No focal suspicious marrow lesion. Sinuses/Orbits: No mass or acute finding within the imaged orbits. Prior bilateral ocular lens replacement. Minimal mucosal thickening scattered within the bilateral ethmoid air cells. Other: Trace fluid within the left mastoid air cells. MRI CERVICAL SPINE FINDINGS Intermittently motion degraded examination, limiting evaluation. Most notably, there is moderate motion  degradation of the sagittal T2 FSE sequence, moderate to severe motion degradation of the axial T2 FSE sequence and moderate to severe motion degradation of the axial T2 GRE sequence. Alignment: Straightening of the expected cervical lordosis. Mild cervical levocurvature. No significant spondylolisthesis. Vertebrae: Vertebral body height is maintained. No significant marrow edema or focal suspicious osseous lesion. Cord: Within limitations of motion degradation, no signal abnormality is identified within the cervical spinal cord. Posterior Fossa, vertebral arteries, paraspinal tissues: Posterior fossa better assessed on concurrently performed brain MRI. Flow voids preserved within the imaged cervical vertebral arteries. No appreciable paraspinal mass or collection. Disc levels: Multilevel disc degeneration, greatest at C5-C6 (moderate) and C6-C7 (mild-to-moderate). C2-C3: Mild facet arthrosis and ligamentum flavum thickening. No significant disc herniation or stenosis. C3-C4: Uncovertebral hypertrophy on the right. Mild facet arthrosis and ligamentum flavum thickening. No significant spinal canal stenosis. Mild-to-moderate right neural foraminal narrowing C4-C5: Mild facet arthrosis. No significant disc herniation or stenosis. C5-C6: Shallow disc bulge. Uncovertebral  hypertrophy (predominantly on the right). The disc bulge mildly effaces the ventral thecal sac, and contacts the ventral spinal cord. Bilateral neural foraminal narrowing (moderate right, mild left). C6-C7: Shallow disc bulge. Mild facet arthrosis. No significant spinal canal or foraminal stenosis. C7-T1: No significant disc herniation or stenosis. IMPRESSION: MRI brain: 1. No evidence of acute intracranial abnormality. 2. Mild-to-moderate chronic small vessel ischemic changes within the cerebral white matter, similar to the prior brain MRI of 12/14/2021. 3. Mild generalized cerebral atrophy. MRI cervical spine: 1. Significantly motion degraded  examination. 2. Cervical spondylosis, as outlined. No more than mild relative spinal canal narrowing. Multilevel foraminal stenosis, as detailed and greatest on the right at C3-C4 (mild-to-moderate) and on the right at C5-C6 (moderate). Disc degeneration is greatest at C5-C6 (moderate) and C6-C7 (mild-to-moderate). 3. Straightening of the expected cervical lordosis. 4. Mild cervical levocurvature. Electronically Signed   By: Kellie Simmering D.O.   On: 05/27/2022 13:54   DG Sniff Test  Result Date: 05/26/2022 CLINICAL DATA:  Acute on chronic shortness of breath EXAM: CHEST FLUOROSCOPY TECHNIQUE: Real-time fluoroscopic evaluation of the chest was performed. FLUOROSCOPY: Radiation Exposure Index (as provided by the fluoroscopic device): 2.1 mGy Kerma COMPARISON:  None Available. FINDINGS: Breathing observed fluoroscopically with quiet respiration, deep breathing, and sharp inspiration. Lung volumes are low at baseline. There is poor diaphragmatic excursion. The study was technically limited by patient coughing. IMPRESSION: Technically limited study. Poor diaphragmatic excursion suggesting at least weakness. Electronically Signed   By: Macy Mis M.D.   On: 05/26/2022 16:17   ECHOCARDIOGRAM COMPLETE  Result Date: 05/26/2022    ECHOCARDIOGRAM REPORT   Patient Name:   SHARY LAMOS Date of Exam: 05/26/2022 Medical Rec #:  [id number]      Height:       60.0 in Accession #:    [id number]     Weight:       167.1 lb Date of Birth:  [birth year]      BSA:          1.729 m Patient Age:    89 years       BP:           115/75 mmHg Patient Gender: F              HR:           86 bpm. Exam Location:  Inpatient Procedure: 2D Echo Indications:    dyspnea  History:        Patient has no prior history of Echocardiogram examinations.  Sonographer:    Johny Chess RDCS Referring Phys: [id number] RAHUL P DESAI IMPRESSIONS  1. Left ventricular ejection fraction, by estimation, is 60 to 65%. The left ventricle has normal function.  The left ventricle has no regional wall motion abnormalities. Left ventricular diastolic parameters are consistent with Grade I diastolic dysfunction (impaired relaxation). Elevated left atrial pressure.  2. Right ventricular systolic function is normal. The right ventricular size is normal.  3. The mitral valve is normal in structure. No evidence of mitral valve regurgitation. No evidence of mitral stenosis.  4. The aortic valve is normal in structure. Aortic valve regurgitation is not visualized. No aortic stenosis is present.  5. The inferior vena cava is normal in size with greater than 50% respiratory variability, suggesting right atrial pressure of 3 mmHg.  6. Agitated saline contrast bubble study was negative, with no evidence of any interatrial shunt. FINDINGS  Left Ventricle: Left ventricular ejection fraction, by estimation, is 60 to  65%. The left ventricle has normal function. The left ventricle has no regional wall motion abnormalities. The left ventricular internal cavity size was normal in size. There is  no left ventricular hypertrophy. Left ventricular diastolic parameters are consistent with Grade I diastolic dysfunction (impaired relaxation). Elevated left atrial pressure. Right Ventricle: The right ventricular size is normal. No increase in right ventricular wall thickness. Right ventricular systolic function is normal. Left Atrium: Left atrial size was normal in size. Right Atrium: Right atrial size was normal in size. Pericardium: There is no evidence of pericardial effusion. Mitral Valve: The mitral valve is normal in structure. No evidence of mitral valve regurgitation. No evidence of mitral valve stenosis. Tricuspid Valve: The tricuspid valve is normal in structure. Tricuspid valve regurgitation is not demonstrated. No evidence of tricuspid stenosis. Aortic Valve: The aortic valve is normal in structure. Aortic valve regurgitation is not visualized. No aortic stenosis is present. Aortic valve  mean gradient measures 9.0 mmHg. Aortic valve peak gradient measures 15.7 mmHg. Aortic valve area, by VTI measures 2.47 cm. Pulmonic Valve: The pulmonic valve was normal in structure. Pulmonic valve regurgitation is not visualized. No evidence of pulmonic stenosis. Aorta: The aortic root is normal in size and structure. Venous: The inferior vena cava is normal in size with greater than 50% respiratory variability, suggesting right atrial pressure of 3 mmHg. IAS/Shunts: No atrial level shunt detected by color flow Doppler. Agitated saline contrast bubble study was negative, with no evidence of any interatrial shunt.  LEFT VENTRICLE PLAX 2D LVIDd:         3.50 cm   Diastology LVIDs:         2.10 cm   LV e' medial:    6.74 cm/s LV PW:         0.90 cm   LV E/e' medial:  11.3 LV IVS:        0.80 cm   LV e' lateral:   8.81 cm/s LVOT diam:     2.19 cm   LV E/e' lateral: 8.7 LV SV:         98 LV SV Index:   57 LVOT Area:     3.77 cm  RIGHT VENTRICLE             IVC RV S prime:     15.60 cm/s  IVC diam: 2.30 cm TAPSE (M-mode): 2.2 cm LEFT ATRIUM             Index        RIGHT ATRIUM           Index LA diam:        3.30 cm 1.91 cm/m   RA Area:     10.50 cm LA Vol (A2C):   45.6 ml 26.37 ml/m  RA Volume:   20.70 ml  11.97 ml/m LA Vol (A4C):   35.2 ml 20.35 ml/m LA Biplane Vol: 41.9 ml 24.23 ml/m  AORTIC VALVE AV Area (Vmax):    2.23 cm AV Area (Vmean):   2.29 cm AV Area (VTI):     2.47 cm AV Vmax:           198.00 cm/s AV Vmean:          134.000 cm/s AV VTI:            0.396 m AV Peak Grad:      15.7 mmHg AV Mean Grad:      9.0 mmHg LVOT Vmax:  117.00 cm/s LVOT Vmean:        81.500 cm/s LVOT VTI:          0.260 m LVOT/AV VTI ratio: 0.66  AORTA Ao Root diam: 3.00 cm MV E velocity: 76.40 cm/s MV A velocity: 140.00 cm/s  SHUNTS MV E/A ratio:  0.55         Systemic VTI:  0.26 m                             Systemic Diam: 2.19 cm Dani Gobble Croitoru MD Electronically signed by Sanda Klein MD Signature Date/Time:  05/26/2022/11:22:14 AM    Final    DG Chest 2 View  Result Date: 05/26/2022 CLINICAL DATA:  81 year old female with hypoxia EXAM: CHEST - 2 VIEW COMPARISON:  05/06/2022, 03/14/2022 FINDINGS: Cardiomediastinal silhouette unchanged in size and contour. Persistent elevation of the left hemidiaphragm. Coarsened interstitial markings throughout with no new confluent airspace disease. Low lung volumes persist. No interlobular septal thickening. No pneumothorax or pleural effusion. Scarring/atelectasis persists at the right lung base. No displaced fracture.  Degenerative changes of the spine IMPRESSION: Low lung volumes with no evidence for acute cardiopulmonary disease Electronically Signed   By: Corrie Mckusick D.O.   On: 05/26/2022 08:22   DG Chest Port 1 View  Result Date: 05/26/2022 CLINICAL DATA:  Respiratory failure EXAM: PORTABLE CHEST 1 VIEW COMPARISON:  05/25/2022 FINDINGS: Very low volume chest with streaky density over the bilateral diaphragm. No effusion, edema, or pneumothorax. Stable heart size when allowing for leftward rotation. IMPRESSION: Low volume chest with atelectasis or infiltrate at the bases. Electronically Signed   By: Jorje Guild M.D.   On: 05/26/2022 05:15   CT Angio Chest PE W and/or Wo Contrast  Result Date: 05/25/2022 CLINICAL DATA:  Hypoxia. EXAM: CT ANGIOGRAPHY CHEST WITH CONTRAST TECHNIQUE: Multidetector CT imaging of the chest was performed using the standard protocol during bolus administration of intravenous contrast. Multiplanar CT image reconstructions and MIPs were obtained to evaluate the vascular anatomy. RADIATION DOSE REDUCTION: This exam was performed according to the departmental dose-optimization program which includes automated exposure control, adjustment of the mA and/or kV according to patient size and/or use of iterative reconstruction technique. CONTRAST:  24m OMNIPAQUE IOHEXOL 350 MG/ML SOLN COMPARISON:  CT angiogram chest 09/26/2021 FINDINGS:  Cardiovascular: Satisfactory opacification of the pulmonary arteries to the segmental level. No evidence of pulmonary embolism. Normal heart size. No pericardial effusion. Mediastinum/Nodes: No enlarged mediastinal, hilar, or axillary lymph nodes. Thyroid gland, trachea, and esophagus demonstrate no significant findings. Lungs/Pleura: There is small amount of atelectasis/consolidation in the right middle lobe, lingula and bilateral lower lobe/lung bases. The lungs are otherwise clear. There is no pleural effusion or pneumothorax. Upper Abdomen: No acute abnormality. Musculoskeletal: No chest wall abnormality. No acute or significant osseous findings. Review of the MIP images confirms the above findings. IMPRESSION: 1. No evidence for pulmonary embolism. 2. Small amount of atelectasis/consolidation in the bilateral lower lobes, lingula and middle lobe. Electronically Signed   By: ARonney AstersM.D.   On: 05/25/2022 22:38   DG Chest 2 View  Result Date: 05/07/2022 CLINICAL DATA:  Pneumonia. EXAM: CHEST - 2 VIEW COMPARISON:  Chest radiograph 03/14/2022 and 01/05/2022 FINDINGS: Chronic linear or bandlike densities at the right lung base. No new airspace disease or pulmonary edema. Mild elevation of the left hemidiaphragm is stable. Heart size is within normal limits and stable. Dextroscoliosis in the lumbar spine. IMPRESSION: No active  cardiopulmonary disease. Electronically Signed   By: Markus Daft M.D.   On: 05/07/2022 21:24      Subjective: Patient seen and examined at the bedside this morning.  Hemodynamically stable for discharge.  Discharge planning discussed with the family at bedside  Discharge Exam: Vitals:   05/29/22 0823 05/29/22 1134  BP:  (!) 141/78  Pulse: 84 92  Resp: 18 20  Temp:  97.9 F (36.6 C)  SpO2: 100% 93%   Vitals:   05/29/22 0643 05/29/22 0752 05/29/22 0823 05/29/22 1134  BP:  120/69  (!) 141/78  Pulse: 84 89 84 92  Resp: '20 20 18 20  '$ Temp:  98.3 F (36.8 C)  97.9 F  (36.6 C)  TempSrc:  Oral  Oral  SpO2: 98% 98% 100% 93%  Weight: 76.3 kg     Height:        General: Pt is alert, awake, not in acute distress Cardiovascular: RRR, S1/S2 +, no rubs, no gallops Respiratory: CTA bilaterally, no wheezing, no rhonchi Abdominal: Soft, NT, ND, bowel sounds + Extremities: no edema, no cyanosis    The results of significant diagnostics from this hospitalization (including imaging, microbiology, ancillary and laboratory) are listed below for reference.     Microbiology: Recent Results (from the past 240 hour(s))  MRSA Next Gen by PCR, Nasal     Status: Abnormal   Collection Time: 05/25/22 11:35 PM   Specimen: Nasal Mucosa; Nasal Swab  Result Value Ref Range Status   MRSA by PCR Next Gen NEGATIVE (A) NOT DETECTED Final    Comment: Performed at [facility], [address], [place], [place] [address]  Respiratory (~20 pathogens) panel by PCR     Status: None   Collection Time: 05/26/22  4:34 PM   Specimen: Nasopharyngeal Swab; Respiratory  Result Value Ref Range Status   Adenovirus NOT DETECTED NOT DETECTED Final   Coronavirus 229E NOT DETECTED NOT DETECTED Final    Comment: (NOTE) The Coronavirus on the Respiratory Panel, DOES NOT test for the novel  Coronavirus (2019 nCoV)    Coronavirus HKU1 NOT DETECTED NOT DETECTED Final   Coronavirus NL63 NOT DETECTED NOT DETECTED Final   Coronavirus OC43 NOT DETECTED NOT DETECTED Final   Metapneumovirus NOT DETECTED NOT DETECTED Final   Rhinovirus / Enterovirus NOT DETECTED NOT DETECTED Final   Influenza A NOT DETECTED NOT DETECTED Final   Influenza B NOT DETECTED NOT DETECTED Final   Parainfluenza Virus 1 NOT DETECTED NOT DETECTED Final   Parainfluenza Virus 2 NOT DETECTED NOT DETECTED Final   Parainfluenza Virus 3 NOT DETECTED NOT DETECTED Final   Parainfluenza Virus 4 NOT DETECTED NOT DETECTED Final   Respiratory Syncytial Virus NOT DETECTED NOT DETECTED Final   Bordetella pertussis NOT DETECTED  NOT DETECTED Final   Bordetella Parapertussis NOT DETECTED NOT DETECTED Final   Chlamydophila pneumoniae NOT DETECTED NOT DETECTED Final   Mycoplasma pneumoniae NOT DETECTED NOT DETECTED Final    Comment: Performed at [facility], [place]. [address] [place], [place] [address]     Labs: BNP (last 3 results) Recent Labs    09/05/21 1556 03/14/22 1607 05/25/22 1925  BNP 6 10.7 00.4   Basic Metabolic Panel: Recent Labs  Lab 05/25/22 1700 05/25/22 1936 05/26/22 0111 05/26/22 0634 05/26/22 1731 05/27/22 0132 05/27/22 0447 05/28/22 0247  NA 138   < > 140 137 136 139 137 142  K 4.1   < > 4.0 4.3 4.4 4.5 4.2 4.2  CL 98  --  96*  --   --  94*  --  99  CO2 30  --  33*  --   --  33*  --  38*  GLUCOSE 92  --  96  --   --  117*  --  100*  BUN 14  --  12  --   --  19  --  21  CREATININE 0.46  --  0.48  --   --  0.50  --  0.46  CALCIUM 9.3  --  9.5  --   --  9.8  --  9.0  MG  --   --  2.0  --   --   --   --   --   PHOS  --   --  4.1  --   --   --   --   --    < > = values in this interval not displayed.   Liver Function Tests: Recent Labs  Lab 05/25/22 1700  AST 19  ALT 16  ALKPHOS 56  BILITOT 0.3  PROT 6.6  ALBUMIN 3.7   Recent Labs  Lab 05/25/22 1700  LIPASE 26   No results for input(s): "AMMONIA" in the last 168 hours. CBC: Recent Labs  Lab 05/25/22 1700 05/25/22 1936 05/26/22 0111 05/26/22 0634 05/26/22 1731 05/27/22 0132 05/27/22 0447 05/28/22 0247  WBC 6.2  --  6.0  --   --  3.4*  --  5.6  NEUTROABS 4.0  --   --   --   --   --   --   --   HGB 13.0   < > 12.5 14.3 14.3 11.5* 12.9 11.9*  HCT 40.9   < > 41.3 42.0 42.0 37.7 38.0 37.8  MCV 90.3  --  91.4  --   --  91.3  --  91.3  PLT 355  --  328  --   --  333  --  309   < > = values in this interval not displayed.   Cardiac Enzymes: Recent Labs  Lab 05/26/22 0102  CKTOTAL 60   BNP: Invalid input(s): "POCBNP" CBG: Recent Labs  Lab 05/27/22 0344 05/27/22 0754 05/27/22 1114 05/27/22 1525  05/27/22 1931  GLUCAP 115* 105* 150* 234* 233*   D-Dimer No results for input(s): "DDIMER" in the last 72 hours. Hgb A1c No results for input(s): "HGBA1C" in the last 72 hours. Lipid Profile No results for input(s): "CHOL", "HDL", "LDLCALC", "TRIG", "CHOLHDL", "LDLDIRECT" in the last 72 hours. Thyroid function studies No results for input(s): "TSH", "T4TOTAL", "T3FREE", "THYROIDAB" in the last 72 hours.  Invalid input(s): "FREET3" Anemia work up No results for input(s): "VITAMINB12", "FOLATE", "FERRITIN", "TIBC", "IRON", "RETICCTPCT" in the last 72 hours. Urinalysis    Component Value Date/Time   COLORURINE YELLOW 05/25/2022 2249   APPEARANCEUR CLEAR 05/25/2022 2249   LABSPEC 1.010 05/25/2022 2249   PHURINE 5.5 05/25/2022 2249   GLUCOSEU NEGATIVE 05/25/2022 2249   HGBUR TRACE (A) 05/25/2022 2249   BILIRUBINUR NEGATIVE 05/25/2022 2249   KETONESUR NEGATIVE 05/25/2022 2249   PROTEINUR NEGATIVE 05/25/2022 2249   UROBILINOGEN 0.2 10/23/2012 1428   NITRITE NEGATIVE 05/25/2022 2249   LEUKOCYTESUR NEGATIVE 05/25/2022 2249   Sepsis Labs Recent Labs  Lab 05/25/22 1700 05/26/22 0111 05/27/22 0132 05/28/22 0247  WBC 6.2 6.0 3.4* 5.6   Microbiology Recent Results (from the past 240 hour(s))  MRSA Next Gen by PCR, Nasal     Status:  Abnormal   Collection Time: 05/25/22 11:35 PM   Specimen: Nasal Mucosa; Nasal Swab  Result Value Ref Range Status   MRSA by PCR Next Gen NEGATIVE (A) NOT DETECTED Final    Comment: Performed at [facility], [address] [place], [place] [address]  Respiratory (~20 pathogens) panel by PCR     Status: None   Collection Time: 05/26/22  4:34 PM   Specimen: Nasopharyngeal Swab; Respiratory  Result Value Ref Range Status   Adenovirus NOT DETECTED NOT DETECTED Final   Coronavirus 229E NOT DETECTED NOT DETECTED Final    Comment: (NOTE) The Coronavirus on the Respiratory Panel, DOES NOT test for the novel  Coronavirus (2019 nCoV)     Coronavirus HKU1 NOT DETECTED NOT DETECTED Final   Coronavirus NL63 NOT DETECTED NOT DETECTED Final   Coronavirus OC43 NOT DETECTED NOT DETECTED Final   Metapneumovirus NOT DETECTED NOT DETECTED Final   Rhinovirus / Enterovirus NOT DETECTED NOT DETECTED Final   Influenza A NOT DETECTED NOT DETECTED Final   Influenza B NOT DETECTED NOT DETECTED Final   Parainfluenza Virus 1 NOT DETECTED NOT DETECTED Final   Parainfluenza Virus 2 NOT DETECTED NOT DETECTED Final   Parainfluenza Virus 3 NOT DETECTED NOT DETECTED Final   Parainfluenza Virus 4 NOT DETECTED NOT DETECTED Final   Respiratory Syncytial Virus NOT DETECTED NOT DETECTED Final   Bordetella pertussis NOT DETECTED NOT DETECTED Final   Bordetella Parapertussis NOT DETECTED NOT DETECTED Final   Chlamydophila pneumoniae NOT DETECTED NOT DETECTED Final   Mycoplasma pneumoniae NOT DETECTED NOT DETECTED Final    Comment: Performed at [facility], [place]. [address], [place], [place] [address]    Please note: You were cared for by a hospitalist during your hospital stay. Once you are discharged, your primary care physician will handle any further medical issues. Please note that NO REFILLS for any discharge medications will be authorized once you are discharged, as it is imperative that you return to your primary care physician (or establish a relationship with a primary care physician if you do not have one) for your post hospital discharge needs so that they can reassess your need for medications and monitor your lab values.    Time coordinating discharge: 40 minutes  SIGNED:   Shelly Coss, MD  [facility] 05/29/2022, 3:57 PM Pager [phone]  If 7PM-7AM, please contact night-coverage [email or web address] Password [email or web address]

## 2022-05-29 NOTE — Progress Notes (Signed)
Modified Barium Swallow Progress Note  Patient Details  Name: Rebecca Osborn MRN: [id number] Date of Birth: 12/10/1940  Today's Date: 05/29/2022  Modified Barium Swallow completed.  Full report located under Chart Review in the Imaging Section.  Brief recommendations include the following:  Clinical Impression  Pt presents with a moderate oropharyngeal dysphagia c/b delayed swallow initiation, reduced anterior hyolaryngeal excursion, incomplete laryngeal closure, and diminshed sensation. Of note, ENT evaluation in January of this year by with laryngoscopy noted a glottal gap, which may also contribute to dysphagia, but could not be visualized on MBSS.  These deficits resulted in silent aspiration of thin liquid by cup and straw during the swallow.  Cued cough was very weak and insufficient to clear aspiration.  Pt's swallow function showed more impairment across study suggesting fatigue may play a role.  With nectar thick liquid there was only very shallow, trace, transient penetration.  There was no penetration of puree or solid.  With pill simulation, pt was unable to transit tablet orally with water. Pt swallowed tablet with puree.  There was stasis of tablet in upper esophagus.  Pt benefited from additional bites of puree to help tablet move through esophagus.  Stasis was noted again near the GE juction, but appeared to clear eventually.  There was intermittent stasis of contrast in cervical esophagus around the location of possible cervical osteophyes which impinge on esophagus (5-7). Pt may benefit from further assessment of esophageal swallow function.  Consider GI consult.    Recommend regular diet with nectar thick liquids.  Pt my have water in moderation, in between meals, after good oral care, when fully awake/alert, with upright positioning and supervision.   Swallow Evaluation Recommendations   Recommended Consults: Consider GI evaluation;Consider esophageal assessment   SLP Diet  Recommendations: Regular solids;Nectar thick liquid   Liquid Administration via: Cup   Medication Administration: Whole meds with puree       Compensations: Slow rate;Small sips/bites;Follow solids with liquid   Postural Changes: Remain semi-upright after after feeds/meals (Comment);Seated upright at 90 degrees   Oral Care Recommendations: Oral care BID   Other Recommendations: Order thickener from pharmacy;Have oral suction available    Rebecca Osborn, Rebecca Osborn, Rebecca Osborn Office: [phone] 05/29/2022,11:36 AM

## 2022-05-29 NOTE — Plan of Care (Signed)

## 2022-05-29 NOTE — Progress Notes (Signed)
RT called by [facility] staff stating that pt in room 4E21 wanted to be placed back on BiPAP to take a nap. RT came to beside and found pt on room air with SVS. RT placed pt back on BiPAP. Pt tolerating well with SVS. RT will continue to monitor pt.

## 2022-05-29 NOTE — Progress Notes (Signed)
Physical Therapy Treatment Patient Details Name: Rebecca Osborn MRN: [id number] DOB: 1940/12/29 Today's Date: 05/29/2022   History of Present Illness pt is a 81 y/o female presenting 6/26 from an outpatient appointyment for further evaluation and work-up for dyspnea with minimal exertion, worsening orthopnea with SpO2 levels on RA at 84%.  ABG in ED demonstrated acute hypoxia and hypercapnic respiratory failure, Placed on BiPAP.  BIPAP weaned, Echo completed.  PMHx:  HTN, thyroid ds, asthma    PT Comments    Pt with continued progress towards goals with less supplemental O2 demand this session. Pt grossly min guard for all bed mobility and transfers with RW and demonstrating steady ambulation with good negotiation around obstacles with RW and min guard for safety. Pulmonary qualifications completed for supplemental O2 with SpO2 on RA dropping to 85% before rising to 92% with 1L. Pt continues to benefit from skilled PT services to progress toward functional mobility goals.    Recommendations for follow up therapy are one component of a multi-disciplinary discharge planning process, led by the attending physician.  Recommendations may be updated based on patient status, additional functional criteria and insurance authorization.  Follow Up Recommendations  No PT follow up     Assistance Recommended at Discharge Set up Supervision/Assistance  Patient can return home with the following A little help with bathing/dressing/bathroom;Assistance with cooking/housework;Assist for transportation;Help with stairs or ramp for entrance;Direct supervision/assist for medications management;Direct supervision/assist for financial management   Equipment Recommendations  None recommended by PT    Recommendations for Other Services       Precautions / Restrictions Precautions Precautions: Fall Restrictions Weight Bearing Restrictions: No     Mobility  Bed Mobility Overal bed mobility: Needs  Assistance Bed Mobility: Supine to Sit, Sit to Supine     Supine to sit: Min guard Sit to supine: Min guard   General bed mobility comments: Min guard for stability to sit and min A to bring BLE into bed    Transfers Overall transfer level: Needs assistance Equipment used: Rolling walker (2 wheels) Transfers: Sit to/from Stand Sit to Stand: Min guard           General transfer comment: Min guard for safety    Ambulation/Gait Ambulation/Gait assistance: Min guard Gait Distance (Feet): 136 Feet Assistive device: Rolling walker (2 wheels) Gait Pattern/deviations: Step-through pattern       General Gait Details: steady gait, uses and maneuvers the RW well sround obstacles, no overt dyspnea.  On RA, sats dropped to 91% before gait and ultimately down to 85% with good pleth by return to room.  1L reapplied with return to 92%   Stairs             Wheelchair Mobility    Modified Rankin (Stroke Patients Only)       Balance Overall balance assessment: Mild deficits observed, not formally tested                                          Cognition Arousal/Alertness: Awake/alert Behavior During Therapy: WFL for tasks assessed/performed Overall Cognitive Status: Within Functional Limits for tasks assessed                                          Exercises  General Comments General comments (skin integrity, edema, etc.): VSS SpO2 >90% on 1L [place]      Pertinent Vitals/Pain Pain Assessment Pain Assessment: No/denies pain Pain Intervention(s): Monitored during session    Home Living                          Prior Function            PT Goals (current goals can now be found in the care plan section) Acute Rehab PT Goals Patient Stated Goal: home safe and at [facility] with OW if appropriate. PT Goal Formulation: With patient/family    Frequency    Min 3X/week      PT Plan Current plan remains appropriate     Co-evaluation              AM-PAC PT "6 Clicks" Mobility   Outcome Measure  Help needed turning from your back to your side while in a flat bed without using bedrails?: A Little Help needed moving from lying on your back to sitting on the side of a flat bed without using bedrails?: A Little Help needed moving to and from a bed to a chair (including a wheelchair)?: A Little Help needed standing up from a chair using your arms (e.g., wheelchair or bedside chair)?: A Little Help needed to walk in hospital room?: A Little Help needed climbing 3-5 steps with a railing? : A Little 6 Click Score: 18    End of Session Equipment Utilized During Treatment: Oxygen Activity Tolerance: Patient tolerated treatment well Patient left: with call bell/phone within reach;with family/visitor present;in bed Nurse Communication: Mobility status PT Visit Diagnosis: Other abnormalities of gait and mobility (R26.89);Difficulty in walking, not elsewhere classified (R26.2)     Time: 6578-4696 PT Time Calculation (min) (ACUTE ONLY): 29 min  Charges:  $Gait Training: 8-22 mins $Therapeutic Activity: 8-22 mins                     Lisle Skillman R. PTA Acute Rehabilitation Services Office: [phone]    Catalina Antigua 05/29/2022, 1:05 PM

## 2022-05-29 NOTE — Progress Notes (Signed)
PROGRESS NOTE  Rebecca Osborn  SAY:[id number] DOB: Sep 26, 1941 DOA: 05/25/2022 PCP: Windell Hummingbird, PA-C   Brief Narrative:  Patient is a 81 year old female with past medical history of asthma, arthritis, back pain, hypothyroidism, hypertension who was admitted for worsening shortness of breath, orthopnea, dry cough, stridor, becoming more weak.  She was sent to the ED from pulmonary clinic.  ABG in the emergency department showed CO2 of 98, pH of 7.19.  She was placed on BiPAP, critical care consulted and was admitted.  Patient's mental status has improved and she has been transferred to our service on 6/29.  Currently on 2-3 L of oxygen.  Overall status has significantly improved.  She qualified for home oxygen.  Monitor pulmonary.  We are waiting for arrangement of noninvasive ventilation at home.  Medically stable for discharge.  She will follow-up with PCCM as an outpatient.   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  History of asthma, follows with Dr. Verlee Monte as an outpatient.  Viral panel negative.  Sniff test on 6/27 showed minimal diaphragm movement on the left.  MRI of the brain/cervical spine did not show any acute abnormalities but showed cervical spondylosis, multilevel foraminal stenosis. PCCM recommending NIV support on discharge for chronic hypercapnic respiratory failure. Last ABG showed pH of 7.37, PCO2 of 81. We also recommend outpatient neurology evaluation, consideration of EMG. She was on 2-3 L of oxygen this morning.  Not on oxygen at home.  She qualified for home oxygen, will need to 1 Lof  oxygen as per PT note.  History of asthma: Follows with pulmonology as an outpatient.  Continue home inhalers  Generalized weakness: This was most likely contributed by  hypercarbia.  PT/OT recommended no follow up on discharge.  Currently alert and oriented.  Upper airway stridor: Patient history of dysphagia/voice change.  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  On PPI, Pepcid, sucralfate at home.  Also on slow steroid taper  Diastolic congestive heart failure: Echo done here showed EF of 60-65 percent, grade 1 diastolic dysfunction.  Currently she is euvolemic  Hypothyroidism: Continue Synthyroid  Hypertension: Continue amlodipine           DVT prophylaxis:rivaroxaban (XARELTO) tablet 10 mg Start: 05/27/22 1700 SCDs Start: 05/25/22 2213 rivaroxaban (XARELTO) tablet 10 mg     Code Status: Full Code  Family Communication: Discussed with daughter at bedside  Patient status: Inpatient   Patient is from : Home  Anticipated discharge to: Home  Estimated DC date: as soon as NIV is arranged   Consultants: PCCM  Procedures: None  Antimicrobials:  Anti-infectives (From admission, onward)    None       Subjective: Patient seen and examined at the bedside this morning.  Hemodynamically stable.  Lying in bed.  She is more alert, communicative than yesterday.  Auscultation of the lungs revealed good air entry on the both sides.  Denies any worsening shortness of breath or cough.  Eager to go home  Objective: Vitals:   05/29/22 0643 05/29/22 0752 05/29/22 0823 05/29/22 1134  BP:  120/69  (!) 141/78  Pulse: 84 89 84 92  Resp: '20 20 18 20  '$ Temp:  98.3 F (36.8 C)  97.9 F (36.6 C)  TempSrc:  Oral  Oral  SpO2: 98% 98% 100% 93%  Weight:  76.3 kg     Height:       No intake or output data in the 24 hours ending 05/29/22 1318  Filed Weights   05/27/22 0500 05/28/22 0447 05/29/22 0643  Weight: 74.4 kg 76.5 kg 76.3 kg    Examination:  General exam: Overall comfortable, not in distress HEENT: PERRL Respiratory system:  no wheezes or crackles , mildly diminished air entry both sides Cardiovascular system: S1 & S2  heard, RRR.  Gastrointestinal system: Abdomen is nondistended, soft and nontender. Central nervous system: Alert and oriented Extremities: No edema, no clubbing ,no cyanosis Skin: No rashes, no ulcers,no icterus     Data Reviewed: I have personally reviewed following labs and imaging studies  CBC: Recent Labs  Lab 05/25/22 1700 05/25/22 1936 05/26/22 0111 05/26/22 0634 05/26/22 1731 05/27/22 0132 05/27/22 0447 05/28/22 0247  WBC 6.2  --  6.0  --   --  3.4*  --  5.6  NEUTROABS 4.0  --   --   --   --   --   --   --   HGB 13.0   < > 12.5 14.3 14.3 11.5* 12.9 11.9*  HCT 40.9   < > 41.3 42.0 42.0 37.7 38.0 37.8  MCV 90.3  --  91.4  --   --  91.3  --  91.3  PLT 355  --  328  --   --  333  --  309   < > = values in this interval not displayed.   Basic Metabolic Panel: Recent Labs  Lab 05/25/22 1700 05/25/22 1936 05/26/22 0111 05/26/22 0634 05/26/22 1731 05/27/22 0132 05/27/22 0447 05/28/22 0247  NA 138   < > 140 137 136 139 137 142  K 4.1   < > 4.0 4.3 4.4 4.5 4.2 4.2  CL 98  --  96*  --   --  94*  --  99  CO2 30  --  33*  --   --  33*  --  38*  GLUCOSE 92  --  96  --   --  117*  --  100*  BUN 14  --  12  --   --  19  --  21  CREATININE 0.46  --  0.48  --   --  0.50  --  0.46  CALCIUM 9.3  --  9.5  --   --  9.8  --  9.0  MG  --   --  2.0  --   --   --   --   --   PHOS  --   --  4.1  --   --   --   --   --    < > = values in this interval not displayed.     Recent Results (from the past 240 hour(s))  MRSA Next Gen by PCR, Nasal     Status: Abnormal   Collection Time: 05/25/22 11:35 PM   Specimen: Nasal Mucosa; Nasal Swab  Result Value Ref Range Status   MRSA by PCR Next Gen NEGATIVE (A) NOT DETECTED Final    Comment: Performed at [facility], [address], [place], [place] [address]  Respiratory (~20 pathogens) panel by PCR     Status: None   Collection Time: 05/26/22  4:34 PM   Specimen: Nasopharyngeal Swab; Respiratory  Result Value Ref Range Status    Adenovirus NOT DETECTED NOT DETECTED Final   Coronavirus 229E NOT DETECTED NOT  DETECTED Final    Comment: (NOTE) The Coronavirus on the Respiratory Panel, DOES NOT test for the novel  Coronavirus (2019 nCoV)    Coronavirus HKU1 NOT DETECTED NOT DETECTED Final   Coronavirus NL63 NOT DETECTED NOT DETECTED Final   Coronavirus OC43 NOT DETECTED NOT DETECTED Final   Metapneumovirus NOT DETECTED NOT DETECTED Final   Rhinovirus / Enterovirus NOT DETECTED NOT DETECTED Final   Influenza A NOT DETECTED NOT DETECTED Final   Influenza B NOT DETECTED NOT DETECTED Final   Parainfluenza Virus 1 NOT DETECTED NOT DETECTED Final   Parainfluenza Virus 2 NOT DETECTED NOT DETECTED Final   Parainfluenza Virus 3 NOT DETECTED NOT DETECTED Final   Parainfluenza Virus 4 NOT DETECTED NOT DETECTED Final   Respiratory Syncytial Virus NOT DETECTED NOT DETECTED Final   Bordetella pertussis NOT DETECTED NOT DETECTED Final   Bordetella Parapertussis NOT DETECTED NOT DETECTED Final   Chlamydophila pneumoniae NOT DETECTED NOT DETECTED Final   Mycoplasma pneumoniae NOT DETECTED NOT DETECTED Final    Comment: Performed at [facility], [place] [address], [place], [place] [address]     Radiology Studies: DG Swallowing Func-Speech Pathology  Result Date: 05/29/2022 Table formatting from the original result was not included. Objective Swallowing Evaluation: Type of Study: MBS-Modified Barium Swallow Study  Patient Details Name: Rebecca Osborn MRN: [id number] Date of Birth: [birth year] Today's Date: 05/29/2022 Time: SLP Start Time (ACUTE ONLY): 0906 -SLP Stop Time (ACUTE ONLY): 3354 SLP Time Calculation (min) (ACUTE ONLY): 16 min Past Medical History: Past Medical History: Diagnosis Date  Arthritis   Asthma   Constipation   Hypertension   Thyroid disease  Past Surgical History: Past Surgical History: Procedure Laterality Date  APPENDECTOMY    BACK SURGERY    JOINT REPLACEMENT    KNEE ARTHROSCOPY   HPI: MCKAELA HOWLEY is a 81  y.o. female who has a PMH as outlined below including but not limited to asthma for which she follows with Dr. Verlee Monte in our clinic.  She presented for routine follow-up 6/26 per Dr. Melvyn Novas notes, her DOE was worse compared to the previous visit to the point she was getting dyspneic with minimal exertion.  She was also experiencing greater orthopnea than before.  Has had ongoing dry cough.  Denies fevers or chills/sweats.  Has had mild pleuritic chest pain centrally located, nonradiating.  Family has also noticed facial edema over past week or so, no obvious LE edema. Apparently her voice is also changed in the past 3 to 4 months getting much weaker.  She has had generalized weakness over the same time.  To where she can barely ambulate without getting dyspneic and O2 levels falling into the low 80s.  Family checks her O2 levels frequently to keep trend and track of things.  Prior to her pulmonary follow-up 6/26, they noted that her O2 levels were as low as 84 on room air. Family states over past 4 months or so, she is quite sedentary, sits on couch most of the day and watches TV. They have noticed that she gets sleepy very easily and often doses off during TV shows or even sometimes during the odd conversation; CXR on 05/26/22 indicated Low volume chest with atelectasis or infiltrate at the bases. Pt placed on Bipap overnight, but is currently on 4L O2.Family reports difficulty with liquids intermittently and xerostomia. 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 BSE  generated.  Subjective: Family stated pt "clears her throat a lot"  Recommendations for follow up therapy are one component of a multi-disciplinary discharge planning process, led by the attending physician.  Recommendations may be updated based on patient status, additional functional criteria and insurance authorization. Assessment / Plan / Recommendation    05/29/2022  11:25 AM Clinical Impressions Clinical Impression Pt presents with a moderate oropharyngeal dysphagia c/b delayed swallow initiation, reduced anterior hyolaryngeal excursion, incomplete laryngeal closure, and diminshed sensation. Of note, ENT evaluation in January of this year by with laryngoscopy noted a glottal gap, which may also contribute to dysphagia, but could not be visualized on MBSS.  These deficits resulted in silent aspiration of thin liquid by cup and straw during the swallow.  Cued cough was very weak and insufficient to clear aspiration.  Pt's swallow function showed more impairment across study suggesting fatigue may play a role.  With nectar thick liquid there was only very shallow, trace, transient penetration.  There was no penetration of puree or solid.  With pill simulation, pt was unable to transit tablet orally with water. Pt swallowed tablet with puree.  There was stasis of tablet in upper esophagus.  Pt benefited from additional bites of puree to help tablet move through esophagus.  Stasis was noted again near the GE juction, but appeared to clear eventually.  There was intermittent stasis of contrast in cervical esophagus around the location of possible cervical osteophyes which impinge on esophagus (5-7). Pt may benefit from further assessment of esophageal swallow function.  Consider GI consult.  Recommend regular diet with nectar thick liquids. Pt my have water in moderation, in between meals, after good oral care, when fully awake/alert, with upright positioning and supervision. SLP Visit Diagnosis Dysphagia, oropharyngeal phase (R13.12);Dysphagia, pharyngoesophageal phase (R13.14) Impact on safety and function Moderate aspiration risk     05/29/2022  11:25 AM Treatment Recommendations Treatment Recommendations Therapy as outlined in treatment plan below     05/29/2022  11:35 AM Prognosis Prognosis for Safe Diet Advancement Good   05/29/2022  11:25 AM Diet Recommendations SLP  Diet Recommendations Regular solids;Nectar thick liquid Liquid Administration via Cup Medication Administration Whole meds with puree Compensations Slow rate;Small sips/bites;Follow solids with liquid Postural Changes Remain semi-upright after after feeds/meals (Comment);Seated upright at 90 degrees     05/29/2022  11:25 AM Other Recommendations Recommended Consults Consider GI evaluation;Consider esophageal assessment Oral Care Recommendations Oral care BID Other Recommendations Order thickener from pharmacy;Have oral suction available Follow Up Recommendations Home health SLP Assistance recommended at discharge Intermittent Supervision/Assistance Functional Status Assessment Patient has had a recent decline in their functional status and demonstrates the ability to make significant improvements in function in a reasonable and predictable amount of time.   05/29/2022  11:25 AM Frequency and Duration  Speech Therapy Frequency (ACUTE ONLY) min 2x/week Treatment Duration 2 weeks     05/29/2022  11:18 AM Oral Phase Oral Phase Impaired Oral - Nectar Cup [facility] Oral - Thin Cup Premature spillage Oral - Thin Straw Premature spillage Oral - Puree WFL Oral - Regular Premature spillage Oral - Pill [facility]    05/29/2022  11:22 AM Pharyngeal Phase Pharyngeal Phase Impaired Pharyngeal- Nectar Cup Delayed swallow initiation-vallecula;Penetration/Aspiration during swallow Pharyngeal Material enters airway, remains ABOVE vocal cords then ejected out Pharyngeal- Thin Cup Delayed swallow initiation-pyriform sinuses;Reduced airway/laryngeal closure;Penetration/Aspiration during swallow;Trace aspiration;Reduced anterior laryngeal mobility Pharyngeal Material enters airway, passes BELOW cords without attempt by patient to eject out (silent aspiration) Pharyngeal- Thin Straw Delayed swallow  initiation-pyriform sinuses Pharyngeal Material enters airway, passes BELOW cords without attempt by patient to eject out (silent aspiration) Pharyngeal- Puree  WFL Pharyngeal Material does not enter airway Pharyngeal- Regular Delayed swallow initiation-vallecula Pharyngeal Material does not enter airway Pharyngeal- Pill WFL Pharyngeal Material does not enter airway    05/29/2022  11:25 AM Cervical Esophageal Phase  Cervical Esophageal Phase Impaired Cervical Esophageal Comment retention of contrast in cervical esophagus Celedonio Savage, MA, CCC-SLP Acute Rehabilitation Services Office: [phone] 05/29/2022, 11:38 AM                     MR CERVICAL SPINE WO CONTRAST  Result Date: 05/27/2022 CLINICAL DATA:  Provided history: Neuro deficit, acute, stroke suspected. Ataxia, nontraumatic, cervical pathology suspected. Additional history provided: Hypercapnia associated with chronic respiratory failure secondary to neuromuscular disorder. EXAM: MRI HEAD WITHOUT CONTRAST MRI CERVICAL SPINE WITHOUT CONTRAST TECHNIQUE: Multiplanar, multiecho pulse sequences of the brain and surrounding structures, and cervical spine, to include the craniocervical junction and cervicothoracic junction, were obtained without intravenous contrast. COMPARISON:  Brain MRI 12/14/2021. Cervical spine MRI 11/20/2021. FINDINGS: MRI HEAD FINDINGS Mild intermittent motion degradation. Brain: Mild generalized cerebral atrophy. Mild-to-moderate multifocal T2 FLAIR hyperintense signal abnormality within the cerebral white matter, nonspecific but compatible with chronic small vessel ischemic disease. There is no acute infarct. No evidence of an intracranial mass. No chronic intracranial blood products. No extra-axial fluid collection. No midline shift. Vascular: Maintained flow voids within the proximal large arterial vessels. Skull and upper cervical spine: No focal suspicious marrow lesion. Sinuses/Orbits: No mass or acute finding within the imaged orbits. Prior bilateral ocular lens replacement. Minimal mucosal thickening scattered within the bilateral ethmoid air cells. Other: Trace fluid within the left  mastoid air cells. MRI CERVICAL SPINE FINDINGS Intermittently motion degraded examination, limiting evaluation. Most notably, there is moderate motion degradation of the sagittal T2 FSE sequence, moderate to severe motion degradation of the axial T2 FSE sequence and moderate to severe motion degradation of the axial T2 GRE sequence. Alignment: Straightening of the expected cervical lordosis. Mild cervical levocurvature. No significant spondylolisthesis. Vertebrae: Vertebral body height is maintained. No significant marrow edema or focal suspicious osseous lesion. Cord: Within limitations of motion degradation, no signal abnormality is identified within the cervical spinal cord. Posterior Fossa, vertebral arteries, paraspinal tissues: Posterior fossa better assessed on concurrently performed brain MRI. Flow voids preserved within the imaged cervical vertebral arteries. No appreciable paraspinal mass or collection. Disc levels: Multilevel disc degeneration, greatest at C5-C6 (moderate) and C6-C7 (mild-to-moderate). C2-C3: Mild facet arthrosis and ligamentum flavum thickening. No significant disc herniation or stenosis. C3-C4: Uncovertebral hypertrophy on the right. Mild facet arthrosis and ligamentum flavum thickening. No significant spinal canal stenosis. Mild-to-moderate right neural foraminal narrowing C4-C5: Mild facet arthrosis. No significant disc herniation or stenosis. C5-C6: Shallow disc bulge. Uncovertebral hypertrophy (predominantly on the right). The disc bulge mildly effaces the ventral thecal sac, and contacts the ventral spinal cord. Bilateral neural foraminal narrowing (moderate right, mild left). C6-C7: Shallow disc bulge. Mild facet arthrosis. No significant spinal canal or foraminal stenosis. C7-T1: No significant disc herniation or stenosis. IMPRESSION: MRI brain: 1. No evidence of acute intracranial abnormality. 2. Mild-to-moderate chronic small vessel ischemic changes within the cerebral white  matter, similar to the prior brain MRI of 12/14/2021. 3. Mild generalized cerebral atrophy. MRI cervical spine: 1. Significantly motion degraded examination. 2. Cervical spondylosis, as outlined. No more than mild relative spinal canal narrowing. Multilevel foraminal stenosis, as detailed and greatest on the right  at C3-C4 (mild-to-moderate) and on the right at C5-C6 (moderate). Disc degeneration is greatest at C5-C6 (moderate) and C6-C7 (mild-to-moderate). 3. Straightening of the expected cervical lordosis. 4. Mild cervical levocurvature. Electronically Signed   By: Kellie Simmering D.O.   On: 05/27/2022 13:54   MR BRAIN WO CONTRAST  Result Date: 05/27/2022 CLINICAL DATA:  Provided history: Neuro deficit, acute, stroke suspected. Ataxia, nontraumatic, cervical pathology suspected. Additional history provided: Hypercapnia associated with chronic respiratory failure secondary to neuromuscular disorder. EXAM: MRI HEAD WITHOUT CONTRAST MRI CERVICAL SPINE WITHOUT CONTRAST TECHNIQUE: Multiplanar, multiecho pulse sequences of the brain and surrounding structures, and cervical spine, to include the craniocervical junction and cervicothoracic junction, were obtained without intravenous contrast. COMPARISON:  Brain MRI 12/14/2021. Cervical spine MRI 11/20/2021. FINDINGS: MRI HEAD FINDINGS Mild intermittent motion degradation. Brain: Mild generalized cerebral atrophy. Mild-to-moderate multifocal T2 FLAIR hyperintense signal abnormality within the cerebral white matter, nonspecific but compatible with chronic small vessel ischemic disease. There is no acute infarct. No evidence of an intracranial mass. No chronic intracranial blood products. No extra-axial fluid collection. No midline shift. Vascular: Maintained flow voids within the proximal large arterial vessels. Skull and upper cervical spine: No focal suspicious marrow lesion. Sinuses/Orbits: No mass or acute finding within the imaged orbits. Prior bilateral ocular lens  replacement. Minimal mucosal thickening scattered within the bilateral ethmoid air cells. Other: Trace fluid within the left mastoid air cells. MRI CERVICAL SPINE FINDINGS Intermittently motion degraded examination, limiting evaluation. Most notably, there is moderate motion degradation of the sagittal T2 FSE sequence, moderate to severe motion degradation of the axial T2 FSE sequence and moderate to severe motion degradation of the axial T2 GRE sequence. Alignment: Straightening of the expected cervical lordosis. Mild cervical levocurvature. No significant spondylolisthesis. Vertebrae: Vertebral body height is maintained. No significant marrow edema or focal suspicious osseous lesion. Cord: Within limitations of motion degradation, no signal abnormality is identified within the cervical spinal cord. Posterior Fossa, vertebral arteries, paraspinal tissues: Posterior fossa better assessed on concurrently performed brain MRI. Flow voids preserved within the imaged cervical vertebral arteries. No appreciable paraspinal mass or collection. Disc levels: Multilevel disc degeneration, greatest at C5-C6 (moderate) and C6-C7 (mild-to-moderate). C2-C3: Mild facet arthrosis and ligamentum flavum thickening. No significant disc herniation or stenosis. C3-C4: Uncovertebral hypertrophy on the right. Mild facet arthrosis and ligamentum flavum thickening. No significant spinal canal stenosis. Mild-to-moderate right neural foraminal narrowing C4-C5: Mild facet arthrosis. No significant disc herniation or stenosis. C5-C6: Shallow disc bulge. Uncovertebral hypertrophy (predominantly on the right). The disc bulge mildly effaces the ventral thecal sac, and contacts the ventral spinal cord. Bilateral neural foraminal narrowing (moderate right, mild left). C6-C7: Shallow disc bulge. Mild facet arthrosis. No significant spinal canal or foraminal stenosis. C7-T1: No significant disc herniation or stenosis. IMPRESSION: MRI brain: 1. No  evidence of acute intracranial abnormality. 2. Mild-to-moderate chronic small vessel ischemic changes within the cerebral white matter, similar to the prior brain MRI of 12/14/2021. 3. Mild generalized cerebral atrophy. MRI cervical spine: 1. Significantly motion degraded examination. 2. Cervical spondylosis, as outlined. No more than mild relative spinal canal narrowing. Multilevel foraminal stenosis, as detailed and greatest on the right at C3-C4 (mild-to-moderate) and on the right at C5-C6 (moderate). Disc degeneration is greatest at C5-C6 (moderate) and C6-C7 (mild-to-moderate). 3. Straightening of the expected cervical lordosis. 4. Mild cervical levocurvature. Electronically Signed   By: Kellie Simmering D.O.   On: 05/27/2022 13:54    Scheduled Meds:  amLODipine  10 mg Oral Daily  arformoterol  15 mcg Nebulization BID   budesonide (PULMICORT) nebulizer solution  0.5 mg Nebulization BID   dextromethorphan-guaiFENesin  1 tablet Oral BID   DULoxetine  30 mg Oral Daily   levothyroxine  88 mcg Oral Daily   montelukast  10 mg Oral QHS   pantoprazole  40 mg Oral Daily   predniSONE  40 mg Oral Q breakfast   Followed by   Derrill Memo ON 05/31/2022] predniSONE  30 mg Oral Q breakfast   Followed by   Derrill Memo ON 06/03/2022] predniSONE  20 mg Oral Q breakfast   Followed by   Derrill Memo ON 06/06/2022] predniSONE  10 mg Oral Q breakfast   rivaroxaban  10 mg Oral Q supper   sucralfate  1 g Oral QID   Continuous Infusions:   LOS: 4 days   Shelly Coss, MD [facility] P6/30/2023, 1:18 PM

## 2022-06-11 ENCOUNTER — Ambulatory Visit (INDEPENDENT_AMBULATORY_CARE_PROVIDER_SITE_OTHER): Payer: Medicare Other | Admitting: Neurology

## 2022-06-11 ENCOUNTER — Encounter: Payer: Self-pay | Admitting: Neurology

## 2022-06-11 VITALS — BP 124/72 | HR 78 | Ht 60.25 in | Wt 169.0 lb

## 2022-06-11 DIAGNOSIS — R1310 Dysphagia, unspecified: Secondary | ICD-10-CM

## 2022-06-11 DIAGNOSIS — R269 Unspecified abnormalities of gait and mobility: Secondary | ICD-10-CM | POA: Diagnosis not present

## 2022-06-11 DIAGNOSIS — J986 Disorders of diaphragm: Secondary | ICD-10-CM | POA: Diagnosis not present

## 2022-06-11 DIAGNOSIS — M5412 Radiculopathy, cervical region: Secondary | ICD-10-CM | POA: Diagnosis not present

## 2022-06-11 DIAGNOSIS — M542 Cervicalgia: Secondary | ICD-10-CM | POA: Diagnosis not present

## 2022-06-11 DIAGNOSIS — G4489 Other headache syndrome: Secondary | ICD-10-CM

## 2022-06-11 NOTE — Progress Notes (Signed)
[facility]  PATIENT: Rebecca Osborn DOB: 11/17/1941  REFERRING DOCTOR OR PCP: Doreen Salvage PA-C SOURCE: Patient, primary care  _________________________________   HISTORICAL  CHIEF COMPLAINT:  Chief Complaint  Patient presents with   Follow-up    Pt with daughter, she was recently in hospital for 4 days with oxygen level dropping. Wears a machine at night. Her neck is still hurting. Alternating head and ice. She notes heaviness in posterior head    HISTORY OF PRESENT ILLNESS:  Rebecca Osborn is a 81 y.o. woman with gait difficulty and more recent problems with speech and swallowing.  She is accompanied by her son, Jobie Quaker, a physician.  UPDTE 06/11/2022: She was very lethargic a couple weeks ago.   She went to her pulmonologist.   SaO2 was 83%.   She was sent to the ED and ABG showed elevated .    DOE was worsening  Her daughter notes voice is more slurred and she is choking some.        She presented to the [facility] emergency room with acute hypoxic/hypercarbic respiratory failure of unknown etiology on 05/25/2022.  She has a history of asthma.  She was felt to have reduced diaphragmatic movement, though this was not unilateral, on a sniff test.  MRI of the brain showed chronic microvascular ischemic change and MRI of the cervical spine showed multilevel degenerative changes with moderate foraminal narrowing to the right at C3-C4 and C5-C6 and no more than mild foraminal narrowing at other levels.  No significant spinal stenosis.  Normal spinal cord.  She was placed on BiPAP and O2 and did better..   She has had postnasal drip since her hospitalization.     Labs showed negative AChR Ab, normal CK  She is having a lot of pain in her neck and head.   The head feels heavy.    She has had neck pain a while and did PT last year for it.    Pan is worse on her right.   The left arm semes weaker to her.     She has no vascular risks (HTN, DM, smoking).   From last visit "She  is a 81 year old who began to have more difficulty with her voice and coming up with the right word about 4-6 months ago.   Swallowing is difficult and food and water get stuck, also beginning a few months ago.    More recently, she has also been reporting numbness in her right side.   She is walking worse .  Stride is reduced and gait is slowed.    She has no falls.    (SHe had one bad fall in 2021 in [place] hitting her right shoulder hard).   She had a mild concussion.   Recently, she has also had neck pain and occipital pain.   The right arm feels weak and she has trouble lifting items for a longer time.    The right leg also feels weaker than her left.   The left arm is getting numb at times.     She has a long history of urinary incontinence,  that is stable.       She has been a little forgetful but will recall with a hint.   No difficulty with math or following directions.  She is still driving and doing all tasks she was doing.     She had spinal surgery in 2007 with lumbar fusion.   She has  thoracolumbar scoliosis.    "  Imaging: MRI of the cervical spine 05/27/2022 shows mild multilevel degenerative changes.  There is moderate foraminal narrowing to the right at C3-C4 and mild spinal stenosis at C5-C6 with moderate right foraminal narrowing and mild left foraminal narrowing.  There is mild cervical levocurvature and slight straightening of the cervical curvature.  MRI of the brain 05/27/2022 shows mild to moderate chronic microvascular ischemic changes.  No involvement of the brainstem.   Fluoroscopic sniff test 05/26/2022 showed poor diaphragmatic excursion.  This is a nonspecific finding.  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  CT angiogram of the chest with contrast shows a small amount of atelectasis/consolidation in the right middle lobe but no pleural effusion or pneumothorax.  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   REVIEW OF SYSTEMS: Constitutional: No fevers, chills, sweats, or change in appetite Eyes: No visual changes, double vision, eye pain Ear, nose and throat: No hearing loss, ear pain, nasal congestion, sore throat Cardiovascular: No chest pain, palpitations Respiratory:  No shortness of breath at rest or with exertion.   No wheezes GastrointestinaI: No nausea, vomiting, diarrhea, abdominal pain, fecal incontinence Genitourinary:  No dysuria, urinary retention or frequency.  No nocturia. Musculoskeletal: As above  Integumentary: No rash, pruritus, skin lesions Neurological: as above Psychiatric: No depression at this time.  No anxiety Endocrine: No palpitations, diaphoresis, change in appetite, change in weigh or increased thirst Hematologic/Lymphatic:  No anemia, purpura, petechiae. Allergic/Immunologic: No itchy/runny eyes, nasal congestion, recent allergic reactions, rashes  ALLERGIES: Allergies  Allergen Reactions   Penicillins Hives   Pork-Derived Products Other (See Comments)    Religious reasons - prefers not to use any pork products.    Nsaids Nausea Only    GI upset     HOME MEDICATIONS:  Current Outpatient Medications:    acetaminophen (TYLENOL) 650 MG CR tablet, Take 650 mg by mouth at bedtime as needed for pain., Disp: , Rfl:    albuterol (PROVENTIL) (2.5 MG/3ML) 0.083% nebulizer solution, Take 2.5 mg by nebulization every 6 (six) hours as needed for wheezing or shortness of breath., Disp: , Rfl:    albuterol (VENTOLIN HFA) 108 (90 Base) MCG/ACT inhaler, Inhale 1-2 puffs into the lungs every 4 (four) hours as needed for wheezing or shortness of breath., Disp: , Rfl:    amLODipine (NORVASC) 10 MG tablet, Take 10 mg by mouth daily., Disp: , Rfl:    Budeson-Glycopyrrol-Formoterol (BREZTRI AEROSPHERE) 160-9-4.8 MCG/ACT AERO, Inhale 2 puffs into  the lungs in the morning and at bedtime., Disp: 10.7 g, Rfl: 6   cetirizine (ZYRTEC) 10 MG tablet, Take 10 mg by mouth daily as needed for allergies., Disp: , Rfl:    dexlansoprazole (DEXILANT) 60 MG capsule, Take 60 mg by mouth daily., Disp: , Rfl:    diclofenac Sodium (VOLTAREN) 1 % GEL, Apply 2-4 g topically 4 (four) times daily as needed (joint pain)., Disp: , Rfl:    DULoxetine (CYMBALTA) 30 MG capsule, Take 1 capsule (30 mg total) by mouth daily., Disp: 30 capsule, Rfl: 11   famotidine (PEPCID) 20 MG tablet, Take 20 mg by mouth at bedtime as needed for heartburn or indigestion., Disp: , Rfl:    fluticasone (FLONASE) 50 MCG/ACT nasal spray, Place 1 spray into both nostrils daily., Disp: , Rfl:    levothyroxine (SYNTHROID, LEVOTHROID) 88 MCG  tablet, Take 88 mcg by mouth daily., Disp: , Rfl:    LIDOCAINE EX, Apply 1 patch topically daily as needed (stomach pain)., Disp: , Rfl:    Misc. Devices (ROLLATOR ULTRA-LIGHT) MISC, Rollator style walker, Disp: 1 each, Rfl: 0   montelukast (SINGULAIR) 10 MG tablet, Take 10 mg by mouth at bedtime., Disp: , Rfl:    Multiple Vitamins-Minerals (MULTIVITAMIN WITH MINERALS) tablet, Take 1 tablet by mouth daily., Disp: , Rfl:    oxyCODONE-acetaminophen (PERCOCET/ROXICET) 5-325 MG tablet, Take 0.25-0.5 tablets by mouth daily as needed for moderate pain., Disp: , Rfl:    rOPINIRole (REQUIP) 0.5 MG tablet, Take 0.5 mg by mouth at bedtime as needed (restless legs/cramps)., Disp: , Rfl:    sodium chloride HYPERTONIC 3 % nebulizer solution, Take by nebulization as needed for other. Please file under diagnosis asthma J45.909 (Patient taking differently: Take 4 mLs by nebulization daily as needed for other or cough. Please file under diagnosis asthma J45.909), Disp: 750 mL, Rfl: 12   Spacer/Aero-Holding Chambers DEVI, 1 each by Does not apply route in the morning and at bedtime. Please use with MDI inhalers, Disp: 1 each, Rfl: 0   sucralfate (CARAFATE) 1 g tablet, Take 1 g  by mouth 4 (four) times daily., Disp: , Rfl:    TRULANCE 3 MG TABS, Take 3 mg by mouth daily., Disp: , Rfl:   PAST MEDICAL HISTORY: Past Medical History:  Diagnosis Date   Arthritis    Asthma    Constipation    Hypertension    Thyroid disease     PAST SURGICAL HISTORY: Past Surgical History:  Procedure Laterality Date   APPENDECTOMY     BACK SURGERY     JOINT REPLACEMENT     KNEE ARTHROSCOPY      FAMILY HISTORY: Family History  Problem Relation Age of Onset   Heart Problems Father     SOCIAL HISTORY:  Social History   Socioeconomic History   Marital status: Widowed    Spouse name: Not on file   Number of children: 5   Years of education: Not on file   Highest education level: High school graduate  Occupational History   Not on file  Tobacco Use   Smoking status: Never   Smokeless tobacco: Never  Vaping Use   Vaping Use: Never used  Substance and Sexual Activity   Alcohol use: No   Drug use: No   Sexual activity: Not on file  Other Topics Concern   Not on file  Social History Narrative   Lives with son   Right handed   Caffeine: 1 cup of tea with milk   Social Determinants of Health   Financial Resource Strain: Not on file  Food Insecurity: Not on file  Transportation Needs: Not on file  Physical Activity: Not on file  Stress: Not on file  Social Connections: Not on file  Intimate Partner Violence: Not on file     PHYSICAL EXAM  Vitals:   06/11/22 1502  BP: 124/72  Pulse: 78  Weight: 169 lb (76.7 kg)  Height: 5' 0.25" (1.53 m)     Body mass index is 32.73 kg/m.   General: The patient is well-developed and well-nourished and in no acute distress.    HEENT:  Head is [place]/AT.  Sclera are anicteric.    Neck: She is tender over the bilateral splenius capitis muscles.  Neck has a mildly reduced range of motion..  Skin: Extremities are without rash or  edema.  Neurologic Exam  Mental status: The patient is alert and oriented x 3 at  the time of the examination. The patient has apparent normal recent and remote memory, with an apparently normal attention span and concentration ability.   Speech is normal.  Cranial nerves: Extraocular movements are full. Pupils are equal, round, and reactive to light and accomodation.  Visual fields are full.  Facial symmetry is present. There is good facial sensation to soft touch bilaterally.Facial strength is normal.  Trapezius and sternocleidomastoid strength is normal.   The tongue is midline, and the patient has symmetric elevation of the soft palate. No obvious hearing deficits are noted.  Motor:  Muscle bulk is normal.   Tone is normal.  She needs to use 1 arm to stand up from the chair.  Strength is  5 / 5 in all 4 extremities.  Neck extension was normal.  Sensory: Sensory testing is intact to pinprick, soft touch and vibration sensation in all 4 extremities.  Coordination: Cerebellar testing reveals good finger-nose-finger and heel-to-shin bilaterally.  Gait and station: Station is normal.  Her gait has a mildly reduced stride.  She takes 5 steps to turn 180 degrees.  Tandem is poor.  There was no significant retropulsion.  Romberg is negative.   Reflexes: Deep tendon reflexes are symmetric and normal bilaterally.      DIAGNOSTIC DATA (LABS, IMAGING, TESTING) - I reviewed patient records, labs, notes, testing and imaging myself where available.  Lab Results  Component Value Date   WBC 5.6 05/28/2022   HGB 11.9 (L) 05/28/2022   HCT 37.8 05/28/2022   MCV 91.3 05/28/2022   PLT 309 05/28/2022      Component Value Date/Time   NA 142 05/28/2022 0247   K 4.2 05/28/2022 0247   CL 99 05/28/2022 0247   CO2 38 (H) 05/28/2022 0247   GLUCOSE 100 (H) 05/28/2022 0247   BUN 21 05/28/2022 0247   CREATININE 0.46 05/28/2022 0247   CREATININE 0.60 09/05/2021 1556   CALCIUM 9.0 05/28/2022 0247   PROT 6.6 05/25/2022 1700   ALBUMIN 3.7 05/25/2022 1700   AST 19 05/25/2022 1700   ALT 16  05/25/2022 1700   ALKPHOS 56 05/25/2022 1700   BILITOT 0.3 05/25/2022 1700   GFRNONAA >60 05/28/2022 0247   GFRAA >60 05/04/2020 1338    Lab Results  Component Value Date   TSH 1.755 05/26/2022       ASSESSMENT AND PLAN  Diaphragm dysfunction - Plan: MuSK Antibodies  Gait disturbance  Cervical radiculopathy  Dysphagia, unspecified type - Plan: MuSK Antibodies  Neck pain  Other headache syndrome   The etiology of her respiratory event is unclear.  Although there was a concern that she had a positive sniff test, she actually did not have a positive sniff test.  Specifically, it did not show hemidiaphragm elevation or paradoxic movement.  It did show reduced excursion of the diaphragm which is nonspecific and could be due to any etiology of weakness. She is close to her prerespiratory event baseline.  She continues to have some difficulty with voice change and swallowing.  Laboratory tests were not consistent with classic myasthenia gravis (acetylcholine receptor antibodies were negative) or myopathy (CK was normal).  I will check lab work for anti-MUSK antibodies, a rare cause of atypical myasthenia gravis mostly affecting the bulbar muscles.  I do not see a need for EMG at this time.  There is no evidence of generalized myopathy on EMG earlier this year.  However,  if she worsens may need to reconsider. Continue to use oxygen during the day and night and BiPAP at night. Due to the neck and headache.  I will do bilateral splenius capitis trigger point injections which were also at that the occipital nerves in order to reduce her pain.  I described the procedure and she gave consent.  This was done with 80 mg Depo-Medrol and a total of 5 cc Marcaine into the bilateral splenius capitis and splenius cervicis muscles using sterile technique.  There was no complication.  Pain was better after 5 minutes.   Return in 4 months or sooner if there are new or worsening neurologic  symptoms.  48-minute office visit with the majority of the time spent face-to-face for history and physical, discussion/counseling and decision-making.  Additional time with record review and documentation.   Bernell Haynie A. Epimenio Foot, MD, [facility] 06/12/2022, 2:16 PM Certified in Neurology, Clinical Neurophysiology, Sleep Medicine and Neuroimaging  [facility] [address] [place], [place] [address] [phone]

## 2022-06-17 LAB — MUSK ANTIBODIES: MuSK Antibodies: <1 U/mL

## 2022-06-17 NOTE — Progress Notes (Signed)
Synopsis: Referred for dyspnea by Windell Hummingbird, PA-C  Subjective:   PATIENT ID: Rebecca Osborn: female DOB: March 18, 1941, MRN: [id number]  Chief Complaint  Patient presents with   Hospitalization Follow-up    Breathing has improved some, but not back to normal.    81yF with history of asthma, referred for dyspnea. Vaccinated for coivd and pneumonia. Never had covid infection.   She has substernal pain, worse with deep breaths over last 6 months. Also burning in nature. Worse at night but she is unsure if there is positional component.   Had an EGD in January with [facility] in [place], read as some gastritis by their report. She has been on dexilant since then.   She thinks she had an echo done with [facility] but has not had stress test.  She got course of steroids for her neck in September and she thinks her breathing was improved somewhat.  She says she had asthma in 24s. She was on an inhaler but doesn't recall which. Thinks it was probably albuterol inhaler. They later told me during the visit that she is on trelegy and singulair.   There is a lot of asthma in the family.   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 She has 2 cats. No pet bird. No recent travel outside of [place].   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 She was started on NIV. Seen by neuro, ordered anti-MUSK Ab (negative), deferring EMG. Got steroid injections there.   She was found to have OP/pharyngoesophageal phase dysphagia during hospitalization. Also had ?stridor while there and was started on short steroid taper.   She is using her trilogy ventilator nightly and with naps. Doesn't feel suffocated by mask. More alert during the day.   Otherwise pertinent review of systems is negative.    Past Medical History:  Diagnosis Date   Arthritis    Asthma    Constipation    Hypertension     Thyroid disease      Family History  Problem Relation Age of Onset   Heart Problems Father      Past Surgical History:  Procedure Laterality Date   APPENDECTOMY     BACK SURGERY     JOINT REPLACEMENT     KNEE ARTHROSCOPY      Social History   Socioeconomic History   Marital status: Widowed    Spouse name: Not on file   Number of children: 5   Years of education: Not on file   Highest education level: High school graduate  Occupational History   Not on file  Tobacco Use   Smoking status: Never   Smokeless tobacco: Never  Vaping Use   Vaping Use: Never used  Substance and Sexual Activity   Alcohol use: No   Drug use: No   Sexual activity: Not on file  Other Topics Concern   Not on file  Social History Narrative   Lives with son   Right handed   Caffeine: 1 cup of tea with milk   Social Determinants of Health   Financial Resource Strain: Not on file  Food Insecurity: Not on file  Transportation Needs: Not on file  Physical Activity: Not on file  Stress: Not on file  Social Connections: Not on file  Intimate Partner Violence: Not on file     Allergies  Allergen Reactions   Penicillins Hives  Pork-Derived Products Other (See Comments)    Religious reasons - prefers not to use any pork products.    Nsaids Nausea Only    GI upset      Outpatient Medications Prior to Visit  Medication Sig Dispense Refill   albuterol (PROVENTIL) (2.5 MG/3ML) 0.083% nebulizer solution Take 2.5 mg by nebulization every 6 (six) hours as needed for wheezing or shortness of breath.     albuterol (VENTOLIN HFA) 108 (90 Base) MCG/ACT inhaler Inhale 1-2 puffs into the lungs every 4 (four) hours as needed for wheezing or shortness of breath.     amLODipine (NORVASC) 10 MG tablet Take 10 mg by mouth daily.     cetirizine (ZYRTEC) 10 MG tablet Take 10 mg by mouth daily as needed for allergies.     dexlansoprazole (DEXILANT) 60 MG capsule Take 60 mg by mouth daily.     diclofenac  Sodium (VOLTAREN) 1 % GEL Apply 2-4 g topically 4 (four) times daily as needed (joint pain).     famotidine (PEPCID) 20 MG tablet Take 20 mg by mouth at bedtime as needed for heartburn or indigestion.     fluticasone (FLONASE) 50 MCG/ACT nasal spray Place 1 spray into both nostrils daily.     levothyroxine (SYNTHROID, LEVOTHROID) 88 MCG tablet Take 88 mcg by mouth daily.     LIDOCAINE EX Apply 1 patch topically daily as needed (stomach pain).     Misc. Devices (ROLLATOR ULTRA-LIGHT) MISC Rollator style walker 1 each 0   montelukast (SINGULAIR) 10 MG tablet Take 10 mg by mouth at bedtime.     Multiple Vitamins-Minerals (MULTIVITAMIN WITH MINERALS) tablet Take 1 tablet by mouth daily.     rOPINIRole (REQUIP) 0.5 MG tablet Take 0.5 mg by mouth at bedtime as needed (restless legs/cramps).     Spacer/Aero-Holding Chambers DEVI 1 each by Does not apply route in the morning and at bedtime. Please use with MDI inhalers 1 each 0   TRULANCE 3 MG TABS Take 3 mg by mouth daily.     Budeson-Glycopyrrol-Formoterol (BREZTRI AEROSPHERE) 160-9-4.8 MCG/ACT AERO Inhale 2 puffs into the lungs in the morning and at bedtime. (Patient not taking: Reported on 06/18/2022) 10.7 g 6   acetaminophen (TYLENOL) 650 MG CR tablet Take 650 mg by mouth at bedtime as needed for pain.     DULoxetine (CYMBALTA) 30 MG capsule Take 1 capsule (30 mg total) by mouth daily. 30 capsule 11   oxyCODONE-acetaminophen (PERCOCET/ROXICET) 5-325 MG tablet Take 0.25-0.5 tablets by mouth daily as needed for moderate pain.     sodium chloride HYPERTONIC 3 % nebulizer solution Take by nebulization as needed for other. Please file under diagnosis asthma J45.909 (Patient taking differently: Take 4 mLs by nebulization daily as needed for other or cough. Please file under diagnosis asthma J45.909) 750 mL 12   sucralfate (CARAFATE) 1 g tablet Take 1 g by mouth 4 (four) times daily.     No facility-administered medications prior to visit.        Objective:   Physical Exam:  General appearance: 81 y.o., female, NAD, conversant  Eyes: anicteric sclerae; PERRL, tracking appropriately HENT: NCAT; MMM Neck: Trachea midline; no lymphadenopathy, no JVD Lungs: CTAB, no crackles, no wheeze, with normal respiratory effort CV: RRR, no murmur  Abdomen: Soft, non-tender; non-distended, BS present  Extremities: No peripheral edema, warm Skin: Normal turgor and texture; no rash Psych: Appropriate affect Neuro: Alert and oriented to person and place, no focal deficit     Vitals:  06/18/22 1027  BP: 126/76  Pulse: 95  Temp: 98.1 F (36.7 C)  TempSrc: Oral  SpO2: 95%  Weight: 167 lb (75.8 kg)  Height: 5' 0.25" (1.53 m)       95% on RA BMI Readings from Last 3 Encounters:  06/18/22 32.34 kg/m  06/11/22 32.73 kg/m  05/29/22 32.85 kg/m   Wt Readings from Last 3 Encounters:  06/18/22 167 lb (75.8 kg)  06/11/22 169 lb (76.7 kg)  05/29/22 168 lb 3.4 oz (76.3 kg)     CBC    Component Value Date/Time   WBC 5.6 05/28/2022 0247   RBC 4.14 05/28/2022 0247   HGB 11.9 (L) 05/28/2022 0247   HCT 37.8 05/28/2022 0247   PLT 309 05/28/2022 0247   MCV 91.3 05/28/2022 0247   MCH 28.7 05/28/2022 0247   MCHC 31.5 05/28/2022 0247   RDW 15.8 (H) 05/28/2022 0247   LYMPHSABS 1.6 05/25/2022 1700   MONOABS 0.5 05/25/2022 1700   EOSABS 0.1 05/25/2022 1700   BASOSABS 0.0 05/25/2022 1700     Chest Imaging:  CTA Chest 10/28 reviewed by me remarkable for only for small lung volumes  CXR 05/04/20 reviewed by me remarkable for decreased lung volumes  CT A/P lung bases 05/2019 reviewed by me and unremarkable  CXR 03/14/22 reviewed by me with low lung volumes  CXR 05/07/22 reviewed by me RLL scar/atelecatsis, low volumes  CXR 05/25/22 reviewed by me stable  Speech eval 05/29/22: Pt presents with a moderate oropharyngeal dysphagia c/b delayed swallow initiation, reduced anterior hyolaryngeal excursion, incomplete laryngeal  closure, and diminshed sensation. Of note, ENT evaluation in January of this year by with laryngoscopy noted a glottal gap, which may also contribute to dysphagia, but could not be visualized on MBSS.  These deficits resulted in silent aspiration of thin liquid by cup and straw during the swallow.  Cued cough was very weak and insufficient to clear aspiration.  Pt's swallow function showed more impairment across study suggesting fatigue may play a role.  With nectar thick liquid there was only very shallow, trace, transient penetration.  There was no penetration of puree or solid.  With pill simulation, pt was unable to transit tablet orally with water. Pt swallowed tablet with puree.  There was stasis of tablet in upper esophagus.  Pt benefited from additional bites of puree to help tablet move through esophagus.  Stasis was noted again near the GE juction, but appeared to clear eventually.  There was intermittent stasis of contrast in cervical esophagus around the location of possible cervical osteophyes which impinge on esophagus (5-7). Pt may benefit from further assessment of esophageal swallow function.  Consider GI consult.  Recommend regular diet with nectar thick liquids. Pt my have water in moderation, in between meals, after good oral care, when fully awake/alert, with upright positioning and supervision.  05/26/22 Sniff: Reduced excursion bl  Pulmonary Functions Testing Results:    Latest Ref Rng & Units 09/17/2021    3:47 PM  PFT Results  FVC-Pre L 1.26   FVC-Predicted Pre % 55   FVC-Post L 1.48   FVC-Predicted Post % 65   Pre FEV1/FVC % % 63   Post FEV1/FCV % % 76   FEV1-Pre L 0.80   FEV1-Predicted Pre % 47   FEV1-Post L 1.13   DLCO uncorrected ml/min/mmHg 12.20   DLCO UNC% % 72   DLVA Predicted % 109   TLC L 3.54   TLC % Predicted % 78   RV %  Predicted % 98    Moderate obstruction with remarkable bronchodilator response  Echocardiogram:   TTE 04/2022:  1. Left ventricular  ejection fraction, by estimation, is 60 to 65%. The  left ventricle has normal function. The left ventricle has no regional  wall motion abnormalities. Left ventricular diastolic parameters are  consistent with Grade I diastolic  dysfunction (impaired relaxation). Elevated left atrial pressure.   2. Right ventricular systolic function is normal. The right ventricular  size is normal.   3. The mitral valve is normal in structure. No evidence of mitral valve  regurgitation. No evidence of mitral stenosis.   4. The aortic valve is normal in structure. Aortic valve regurgitation is  not visualized. No aortic stenosis is present.   5. The inferior vena cava is normal in size with greater than 50%  respiratory variability, suggesting right atrial pressure of 3 mmHg.   6. Agitated saline contrast bubble study was negative, with no evidence  of any interatrial shunt.   FINDINGS   Left Ventricle: Left ventricular ejection fraction, by estimation, is 60  to 65%. The left ventricle has normal function. The left ventricle has no  regional wall motion abnormalities. The left ventricular internal cavity  size was normal in size. There is   no left ventricular hypertrophy. Left ventricular diastolic parameters  are consistent with Grade I diastolic dysfunction (impaired relaxation).  Elevated left atrial pressure.   Right Ventricle: The right ventricular size is normal. No increase in  right ventricular wall thickness. Right ventricular systolic function is  normal.   Left [facility]: Left atrial size was normal in size.   Right [facility]: Right atrial size was normal in size.   Pericardium: There is no evidence of pericardial effusion.   Mitral Valve: The mitral valve is normal in structure. No evidence of  mitral valve regurgitation. No evidence of mitral valve stenosis.   Tricuspid Valve: The tricuspid valve is normal in structure. Tricuspid  valve regurgitation is not demonstrated. No evidence  of tricuspid  stenosis.   Aortic Valve: The aortic valve is normal in structure. Aortic valve  regurgitation is not visualized. No aortic stenosis is present. Aortic  valve mean gradient measures 9.0 mmHg. Aortic valve peak gradient measures  15.7 mmHg. Aortic valve area, by VTI  measures 2.47 cm.   Pulmonic Valve: The pulmonic valve was normal in structure. Pulmonic valve  regurgitation is not visualized. No evidence of pulmonic stenosis.   Aorta: The aortic root is normal in size and structure.   Venous: The inferior vena cava is normal in size with greater than 50%  respiratory variability, suggesting right atrial pressure of 3 mmHg.   IAS/Shunts: No atrial level shunt detected by color flow Doppler. Agitated  saline contrast bubble study was negative, with no evidence of any  interatrial shunt.      LEFT VENTRICLE  PLAX 2D  LVIDd:         3.50 cm   Diastology  LVIDs:         2.10 cm   LV e' medial:    6.74 cm/s  LV PW:         0.90 cm   LV E/e' medial:  11.3  LV IVS:        0.80 cm   LV e' lateral:   8.81 cm/s  LVOT diam:     2.19 cm   LV E/e' lateral: 8.7  LV SV:         98  LV SV Index:   57  LVOT Area:     3.77 cm      RIGHT VENTRICLE             IVC  RV S prime:     15.60 cm/s  IVC diam: 2.30 cm  TAPSE (M-mode): 2.2 cm   LEFT ATRIUM             Index        RIGHT ATRIUM           Index  LA diam:        3.30 cm 1.91 cm/m   RA Area:     10.50 cm  LA Vol (A2C):   45.6 ml 26.37 ml/m  RA Volume:   20.70 ml  11.97 ml/m  LA Vol (A4C):   35.2 ml 20.35 ml/m  LA Biplane Vol: 41.9 ml 24.23 ml/m   AORTIC VALVE  AV Area (Vmax):    2.23 cm  AV Area (Vmean):   2.29 cm  AV Area (VTI):     2.47 cm  AV Vmax:           198.00 cm/s  AV Vmean:          134.000 cm/s  AV VTI:            0.396 m  AV Peak Grad:      15.7 mmHg  AV Mean Grad:      9.0 mmHg  LVOT Vmax:         117.00 cm/s  LVOT Vmean:        81.500 cm/s  LVOT VTI:          0.260 m  LVOT/AV VTI ratio:  0.66     AORTA  Ao Root diam: 3.00 cm   MV E velocity: 76.40 cm/s  MV A velocity: 140.00 cm/s  SHUNTS  MV E/A ratio:  0.55         Systemic VTI:  0.26 m                              Systemic Diam: 2.19 cm   Dani Gobble Croitoru MD  Electronically signed by Sanda Klein MD  Signature Date/Time: 05/26/2022/11:22:14 AM         Final     Imaging Info  ECHOCARDIOGRAM COMPLETE (Order #[id number]) on 05/25/22   Study History  ECHOCARDIOGRAM COMPLETE (Order #[id number]) on 05/25/22   Syngo Images   Show images for ECHOCARDIOGRAM COMPLETE Images on [employer]   Show images for Nicholes Mango R Performing Technologist/Nurse  Performing Technologist/Nurse: Kenney Houseman, RDCS Reason for Exam Priority: Routine Not on file   Patient Data  Height 60 in      BP 115/75 mmHg          Surgical History  Surgical History  No past medical history on file.    Other Surgical History  Procedure Laterality Date Comment Source  APPENDECTOMY      BACK SURGERY      JOINT REPLACEMENT      KNEE ARTHROSCOPY         Implants     No active implants to display in this view.   Encounter-Level Documents on 05/25/2022:  Scan on 06/01/2022 9:39 AM by Default, Provider, MD Document on 05/29/2022 11:59 PM by Valera Castle, RN: ED PB Billing Extract Document on 05/29/2022 4:52 PM by Tincher, Albin Felling, RN: IP  After Visit Summary Scan on 05/29/2022 10:02 AM by Default, Provider, MD Scan on 05/28/2022 9:49 AM by Default, Provider, MD Electronic signature on 05/25/2022 5:55 PM - 1 of 3 e-signatures recorded Electronic signature on 05/25/2022 4:40 PM - 1 of 2 e-signatures recorded Scan on 05/26/2022 11:34 AM by Carmon Ginsberg Documents on 05/25/2022:  Scan on 05/26/2022 12:16 PM by Default, Provider, MD Scan on 05/26/2022 11:22 AM by Default, Provider, MD      Resulted by:  Signed Date/Time  Phone Pager  CROITORU, [facility] 05/26/2022 11:22 AM [phone]     External Result Report  External Result Report    ECHOCARDIOGRAM COMPLETE: Patient Communication  Add Comments  Add Notifications      Existing Charges  Charge Line Charge Code Status Charge Trigger Charge Type  [id number] [facility] 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 Wonder if her trouble swallowing is related - it seems like she is at risk for recurrent aspiration. She has had neck injuries in past but MR c spine here recently ok.   Plan: - bmp, check download from [employer] - continue trilogy at night and with naps  - continue breztri 2 puffs BID with spacer  - family thinks cough is too weak to try flutter valve - [facility] - home health PT - before next visit still still want to check PFT with supine/upright, and MIP/MEP, persistently low lung volumes, and FVC that seems low out of proportion to borderline air trapping  RTC 3 months    Maryjane Hurter, MD [facility] 06/18/2022 10:34 AM

## 2022-06-18 ENCOUNTER — Encounter: Payer: Self-pay | Admitting: Student

## 2022-06-18 ENCOUNTER — Ambulatory Visit (INDEPENDENT_AMBULATORY_CARE_PROVIDER_SITE_OTHER): Payer: Medicare Other | Admitting: Student

## 2022-06-18 VITALS — BP 126/76 | HR 95 | Temp 98.1°F | Ht 60.25 in | Wt 167.0 lb

## 2022-06-18 DIAGNOSIS — R0609 Other forms of dyspnea: Secondary | ICD-10-CM | POA: Diagnosis not present

## 2022-06-18 DIAGNOSIS — J9612 Chronic respiratory failure with hypercapnia: Secondary | ICD-10-CM | POA: Diagnosis not present

## 2022-06-18 LAB — BASIC METABOLIC PANEL
BUN: 17 mg/dL (ref 6–23)
CO2: 29 mEq/L (ref 19–32)
Calcium: 9.9 mg/dL (ref 8.4–10.5)
Chloride: 102 mEq/L (ref 96–112)
Creatinine, Ser: 0.46 mg/dL (ref 0.40–1.20)
GFR: 91.22 mL/min (ref >60.00)
Glucose, Bld: 79 mg/dL (ref 70–99)
Potassium: 4.1 mEq/L (ref 3.5–5.1)
Sodium: 139 mEq/L (ref 135–145)

## 2022-06-18 NOTE — Patient Instructions (Signed)
-   labs today - keep up the good work using trilogy nightly and with naps, we'll confirm settings with [employer], check labs today to see if we need to make any adjustments - breztri 2 puffs twice daily, rinse mouth after use - we will look into home health PT - referral made to neurology at [place] - see you in 3 months or sooner if need be!

## 2022-06-23 LAB — RESPIRATORY ALLERGY PROFILE REGION II ~~LOC~~

## 2022-06-23 LAB — INTERPRETATION:

## 2022-06-24 ENCOUNTER — Telehealth: Payer: Self-pay | Admitting: Student

## 2022-06-24 DIAGNOSIS — J9612 Chronic respiratory failure with hypercapnia: Secondary | ICD-10-CM

## 2022-06-25 NOTE — Telephone Encounter (Signed)
Order placed per AVS. Waiting on signature from Dr Verlee Monte. Nothing further needed

## 2022-06-30 ENCOUNTER — Telehealth: Payer: Self-pay | Admitting: Student

## 2022-06-30 DIAGNOSIS — J9612 Chronic respiratory failure with hypercapnia: Secondary | ICD-10-CM

## 2022-07-01 NOTE — Telephone Encounter (Signed)
Attempted to call pt's daughter Smitty Cords but unable to reach. Left message for her to return call.

## 2022-07-02 NOTE — Telephone Encounter (Signed)
Dr. Verlee Monte could you please advise what Neurology consult was ordered for? I did not see it directly addressed in last OV note on 06/18/22.

## 2022-07-02 NOTE — Telephone Encounter (Signed)
Can we send the Neuro Referral back out and explain that Dr Verlee Monte is wanting them to access for   To assess for any neurologic condition that may explain diaphragmatic weakness - needs sleep/neuromuscular specialist  Daughter states [facility] called and told her that this is a pulmonary issue and that they were not the department to help her mother.   Please advise   Thank you

## 2022-07-02 NOTE — Telephone Encounter (Signed)
To assess for any neurologic condition that may explain diaphragmatic weakness - needs sleep/neuromuscular specialist

## 2022-07-03 NOTE — Telephone Encounter (Signed)
Called and spoke with patients daughter. Patients daughter stated that they would like a referral placed to [facility] or [facility] to see if they could get them in any sooner.

## 2022-07-03 NOTE — Telephone Encounter (Signed)
Dr Verlee Monte sir,   Please note message below from [place]:  called [facility] - Dr Lorine Bears office [phone] - and spoke to [place].  Explained to her why NM wants pt to see neurology.  Kennyth Lose states Dr Dione Booze is general neurology and will only see pt's that have not seen neurology previously.  She scheduled pt for 1/10 at 1:00 with Dr Murtis Sink - she states this is first opening they have.  I called pt's dtr and gave her appt info.  She states this is really to far out and wants to know what else they can do.   Please advise sir

## 2022-07-03 NOTE — Telephone Encounter (Addendum)
I called [facility] [place] [facility] - Dr Lorine Bears office [phone] - and spoke to [place].  Explained to her why NM wants pt to see neurology.  Kennyth Lose states Dr Dione Booze is general neurology and will only see pt's that have not seen neurology previously.  She scheduled pt for 1/10 at 1:00 with Dr Murtis Sink - she states this is first opening they have.  I called pt's dtr and gave her appt info.  She states this is really to far out and wants to know what else they can do.

## 2022-07-03 NOTE — Telephone Encounter (Signed)
I can place referral to [facility] or [facility] if they wish

## 2022-07-06 ENCOUNTER — Telehealth: Payer: Self-pay | Admitting: Student

## 2022-07-06 DIAGNOSIS — R1312 Dysphagia, oropharyngeal phase: Secondary | ICD-10-CM

## 2022-07-07 NOTE — Telephone Encounter (Signed)
Home health is requesting a evaluation social work [place] is requesting a speech therapy assessment  She wants a verbal to get orders continue speech therapy for at least 2 more weeks.   Please advise on verbal orders?

## 2022-07-09 NOTE — Telephone Encounter (Signed)
Order placed for speech

## 2022-07-09 NOTE — Telephone Encounter (Signed)
Referral placed to [facility] neurology for workup of suspected diaphragmatic weakness

## 2022-07-09 NOTE — Telephone Encounter (Addendum)
I faxed referral to [facility] today.  I called dtr and made her aware of the referral.  Nothing further needed.

## 2022-07-13 ENCOUNTER — Telehealth: Payer: Self-pay | Admitting: Student

## 2022-07-13 NOTE — Telephone Encounter (Signed)
Called Mickel Baas and there was no answer- LMTCB.

## 2022-07-14 NOTE — Telephone Encounter (Signed)
[facility] nurse returning call and can be reached @ [phone].Hillery Hunter

## 2022-07-14 NOTE — Telephone Encounter (Signed)
Left message for Laura to call back.

## 2022-07-28 ENCOUNTER — Telehealth: Payer: Self-pay | Admitting: Student

## 2022-07-30 ENCOUNTER — Telehealth: Payer: Self-pay | Admitting: Student

## 2022-07-30 NOTE — Telephone Encounter (Signed)
Created in error

## 2022-07-31 NOTE — Telephone Encounter (Signed)
LMTCB

## 2022-08-01 ENCOUNTER — Emergency Department (HOSPITAL_BASED_OUTPATIENT_CLINIC_OR_DEPARTMENT_OTHER): Payer: Medicare Other

## 2022-08-01 ENCOUNTER — Other Ambulatory Visit: Payer: Self-pay

## 2022-08-01 ENCOUNTER — Emergency Department (HOSPITAL_BASED_OUTPATIENT_CLINIC_OR_DEPARTMENT_OTHER)
Admission: EM | Admit: 2022-08-01 | Discharge: 2022-08-01 | Disposition: A | Discharge status: Home / Self Care | Payer: Medicare Other | Attending: Emergency Medicine | Admitting: Emergency Medicine

## 2022-08-01 ENCOUNTER — Encounter (HOSPITAL_BASED_OUTPATIENT_CLINIC_OR_DEPARTMENT_OTHER): Payer: Self-pay | Admitting: Emergency Medicine

## 2022-08-01 DIAGNOSIS — E876 Hypokalemia: Secondary | ICD-10-CM | POA: Diagnosis not present

## 2022-08-01 DIAGNOSIS — R7309 Other abnormal glucose: Secondary | ICD-10-CM | POA: Diagnosis not present

## 2022-08-01 DIAGNOSIS — R519 Headache, unspecified: Secondary | ICD-10-CM | POA: Diagnosis present

## 2022-08-01 LAB — CBC WITH DIFFERENTIAL/PLATELET
Abs Immature Granulocytes: 0.03 10*3/uL (ref 0.00–0.07)
Basophils Absolute: 0 10*3/uL (ref 0.0–0.1)
Basophils Relative: 0 %
Eosinophils Absolute: 0.1 10*3/uL (ref 0.0–0.5)
Eosinophils Relative: 1 %
HCT: 38.2 % (ref 36.0–46.0)
Hemoglobin: 12.3 g/dL (ref 12.0–15.0)
Immature Granulocytes: 0 %
Lymphocytes Relative: 28 %
Lymphs Abs: 2.5 10*3/uL (ref 0.7–4.0)
MCH: 28.2 pg (ref 26.0–34.0)
MCHC: 32.2 g/dL (ref 30.0–36.0)
MCV: 87.6 fL (ref 80.0–100.0)
Monocytes Absolute: 0.6 10*3/uL (ref 0.1–1.0)
Monocytes Relative: 7 %
Neutro Abs: 5.5 10*3/uL (ref 1.7–7.7)
Neutrophils Relative %: 64 %
Platelets: 406 10*3/uL — ABNORMAL HIGH (ref 150–400)
RBC: 4.36 MIL/uL (ref 3.87–5.11)
RDW: 16.5 % — ABNORMAL HIGH (ref 11.5–15.5)
WBC: 8.7 10*3/uL (ref 4.0–10.5)
nRBC: 0 % (ref 0.0–0.2)

## 2022-08-01 LAB — BASIC METABOLIC PANEL
Anion gap: 8 (ref 5–15)
BUN: 12 mg/dL (ref 8–23)
CO2: 30 mmol/L (ref 22–32)
Calcium: 9.3 mg/dL (ref 8.9–10.3)
Chloride: 100 mmol/L (ref 98–111)
Creatinine, Ser: 0.53 mg/dL (ref 0.44–1.00)
GFR, Estimated: >60 mL/min (ref >60)
Glucose, Bld: 120 mg/dL — ABNORMAL HIGH (ref 70–99)
Potassium: 3.3 mmol/L — ABNORMAL LOW (ref 3.5–5.1)
Sodium: 138 mmol/L (ref 135–145)

## 2022-08-01 MED ORDER — DEXAMETHASONE SODIUM PHOSPHATE 4 MG/ML IJ SOLN
4.0000 mg | Freq: Once | INTRAMUSCULAR | Status: AC
  Administered 2022-08-01: 4 mg via INTRAVENOUS
  Filled 2022-08-01: qty 1

## 2022-08-01 MED ORDER — METOCLOPRAMIDE HCL 5 MG/ML IJ SOLN
10.0000 mg | Freq: Once | INTRAMUSCULAR | Status: AC
Start: 2022-08-01 — End: 2022-08-01
  Administered 2022-08-01: 10 mg via INTRAVENOUS
  Filled 2022-08-01: qty 2

## 2022-08-01 NOTE — ED Triage Notes (Signed)
Reports headache that started this afternoon.  No relief with tylenol and ibuprofen.

## 2022-08-01 NOTE — Discharge Instructions (Signed)
You were seen in the emergency department for evaluation of a headache.  You had blood work and a CAT scan that did not show an obvious explanation for your symptoms.  Please stay well-hydrated and can continue to use Tylenol and ibuprofen as needed.  Follow-up with your primary care doctor.  Return to the emergency department if any worsening or concerning symptoms.

## 2022-08-01 NOTE — ED Provider Notes (Signed)
[facility] DEPARTMENT Provider Note   CSN: [id number] Arrival date & time: 08/01/22  1941     History {Add pertinent medical, surgical, social history, OB history to HPI:1} Chief Complaint  Patient presents with   Headache    Rebecca Osborn is a 81 y.o. female.  She speaks some [place] but mostly Punjabi and her family member is translating at her request.  Complaining of a generalized headache that she calls pressure 8 out of 10 that started slowly earlier today and has been getting progressively worse.  Not associated with any visual change nausea vomiting fevers chills.  Baseline short of breath no changes.  Family member states they checked her vital signs and they were all normal.  She tried some Tylenol and ibuprofen without any improvement.  She has baseline oxygen dependence.  No sick contacts or recent travel.  The history is provided by the patient and a relative.  Headache Pain location:  Generalized Quality:  Dull Radiates to:  Does not radiate Severity currently:  8/10 Severity at highest:  8/10 Onset quality:  Gradual Duration:  1 day Timing:  Constant Progression:  Worsening Chronicity:  New Similar to prior headaches: no   Relieved by:  Nothing Worsened by:  Nothing Ineffective treatments:  Acetaminophen and NSAIDs Associated symptoms: no abdominal pain, no eye pain, no facial pain, no fever, no loss of balance, no nausea, no neck pain, no numbness, no visual change, no vomiting and no weakness        Home Medications Prior to Admission medications   Medication Sig Start Date End Date Taking? Authorizing Provider  albuterol (PROVENTIL) (2.5 MG/3ML) 0.083% nebulizer solution Take 2.5 mg by nebulization every 6 (six) hours as needed for wheezing or shortness of breath. 04/25/22   [provider]  albuterol (VENTOLIN HFA) 108 (90 Base) MCG/ACT inhaler Inhale 1-2 puffs into the lungs every 4 (four) hours as needed for wheezing or  shortness of breath. 08/27/21   [provider]  amLODipine (NORVASC) 10 MG tablet Take 10 mg by mouth daily.    [provider]  Budeson-Glycopyrrol-Formoterol (BREZTRI AEROSPHERE) 160-9-4.8 MCG/ACT AERO Inhale 2 puffs into the lungs in the morning and at bedtime. Patient not taking: Reported on 06/18/2022 04/20/22   Maryjane Hurter, MD  cetirizine (ZYRTEC) 10 MG tablet Take 10 mg by mouth daily as needed for allergies. 07/05/21   [provider]  dexlansoprazole (DEXILANT) 60 MG capsule Take 60 mg by mouth daily. 10/30/21   [provider]  diclofenac Sodium (VOLTAREN) 1 % GEL Apply 2-4 g topically 4 (four) times daily as needed (joint pain). 02/27/22   [provider]  famotidine (PEPCID) 20 MG tablet Take 20 mg by mouth at bedtime as needed for heartburn or indigestion. 05/22/21   [provider]  fluticasone (FLONASE) 50 MCG/ACT nasal spray Place 1 spray into both nostrils daily. 06/23/21   [provider]  levothyroxine (SYNTHROID, LEVOTHROID) 88 MCG tablet Take 88 mcg by mouth daily.    [provider]  LIDOCAINE EX Apply 1 patch topically daily as needed (stomach pain).    [provider]  Misc. Devices (ROLLATOR ULTRA-LIGHT) MISC Rollator style walker 01/07/22   Sater, Nanine Means, MD  montelukast (SINGULAIR) 10 MG tablet Take 10 mg by mouth at bedtime. 10/27/21   [provider]  Multiple Vitamins-Minerals (MULTIVITAMIN WITH MINERALS) tablet Take 1 tablet by mouth daily.    [provider]  rOPINIRole (REQUIP) 0.5 MG  tablet Take 0.5 mg by mouth at bedtime as needed (restless legs/cramps). 10/27/21   [provider]  Spacer/Aero-Holding Josiah Lobo DEVI 1 each by Does not apply route in the morning and at bedtime. Please use with MDI inhalers 11/04/21   Maryjane Hurter, MD  TRULANCE 3 MG TABS Take 3 mg by mouth daily. 10/14/21   [provider]      Allergies    Penicillins,  Pork-derived products, and Nsaids    Review of Systems   Review of Systems  Constitutional:  Negative for fever.  Eyes:  Negative for pain and visual disturbance.  Respiratory:  Positive for shortness of breath (chronic).   Cardiovascular:  Negative for chest pain.  Gastrointestinal:  Negative for abdominal pain, nausea and vomiting.  Musculoskeletal:  Negative for neck pain.  Neurological:  Positive for headaches. Negative for weakness, numbness and loss of balance.    Physical Exam Updated Vital Signs BP (!) 175/89 (BP Location: Right Arm)   Pulse 74   Temp 97.6 F (36.4 C) (Oral)   Resp 18   Ht 5' 0.25" (1.53 m)   Wt 75.3 kg   SpO2 93%   BMI 32.15 kg/m  Physical Exam Vitals and nursing note reviewed.  Constitutional:      General: She is not in acute distress.    Appearance: She is well-developed.  HENT:     Head: Normocephalic and atraumatic.  Eyes:     Conjunctiva/sclera: Conjunctivae normal.  Neck:     Meningeal: Brudzinski's sign and Kernig's sign absent.  Cardiovascular:     Rate and Rhythm: Normal rate and regular rhythm.     Heart sounds: No murmur heard. Pulmonary:     Effort: Pulmonary effort is normal. No respiratory distress.     Breath sounds: Normal breath sounds.  Abdominal:     Palpations: Abdomen is soft.     Tenderness: There is no abdominal tenderness.  Musculoskeletal:        General: No swelling.     Cervical back: Neck supple.  Skin:    General: Skin is warm and dry.     Capillary Refill: Capillary refill takes less than 2 seconds.  Neurological:     Mental Status: She is alert.     GCS: GCS eye subscore is 4. GCS verbal subscore is 5. GCS motor subscore is 6.     Cranial Nerves: No cranial nerve deficit, dysarthria or facial asymmetry.     Sensory: No sensory deficit.     Motor: No weakness.     ED Results / Procedures / Treatments   Labs (all labs ordered are listed, but only abnormal results are displayed) Labs Reviewed - No  data to display  EKG None  Radiology No results found.  Procedures Procedures  {Document cardiac monitor, telemetry assessment procedure when appropriate:1}  Medications Ordered in ED Medications  metoCLOPramide (REGLAN) injection 10 mg (has no administration in time range)  dexamethasone (DECADRON) injection 4 mg (has no administration in time range)    ED Course/ Medical Decision Making/ A&P                           Medical Decision Making Amount and/or Complexity of Data Reviewed Labs: ordered. Radiology: ordered.  Risk Prescription drug management.  This patient complains of ***; this involves an extensive number of treatment Options and is a complaint that carries with it a high risk of complications and morbidity. The  differential includes ***  I ordered, reviewed and interpreted labs, which included *** I ordered medication *** and reviewed PMP when indicated. I ordered imaging studies which included *** and I independently    visualized and interpreted imaging which showed *** Additional history obtained from *** Previous records obtained and reviewed *** I consulted *** and discussed lab and imaging findings and discussed disposition.  Cardiac monitoring reviewed, *** Social determinants considered, *** Critical Interventions: ***  After the interventions stated above, I reevaluated the patient and found *** Admission and further testing considered, ***    {Document critical care time when appropriate:1} {Document review of labs and clinical decision tools ie heart score, Chads2Vasc2 etc:1}  {Document your independent review of radiology images, and any outside records:1} {Document your discussion with family members, caretakers, and with consultants:1} {Document social determinants of health affecting pt's care:1} {Document your decision making why or why not admission, treatments were needed:1} Final Clinical Impression(s) / ED Diagnoses Final  diagnoses:  None    Rx / DC Orders ED Discharge Orders     None

## 2022-10-19 ENCOUNTER — Other Ambulatory Visit: Payer: Self-pay | Admitting: Anesthesiology

## 2022-10-19 DIAGNOSIS — M5115 Intervertebral disc disorders with radiculopathy, thoracolumbar region: Secondary | ICD-10-CM

## 2023-01-28 ENCOUNTER — Encounter: Payer: Self-pay | Admitting: Radiology

## 2023-03-17 ENCOUNTER — Telehealth: Payer: Self-pay | Admitting: Student

## 2023-03-17 NOTE — Telephone Encounter (Signed)
Spoke to the pt's daughter and she states her mom's o2 has been dropping to 85-87% while sitting and laying down. 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 Pt's daughter also states that O2 goes up to 90% at times and pt has been taking neb and inhaler and is sob at times. Pt daughter stated her mom is in [place] with her and not in [place]. I informed pt's daughter I will send Dr Thora Lance a message and get back to her. She verbalized understanding.

## 2023-03-17 NOTE — Telephone Encounter (Signed)
PT daughter calling. Mom is a ALS PT on a Bipap and of late her O2 levels have dropped/ She said this morning they were between 85-87 at rest. The have noticed this since yesterday  Pls call daughter Andria Rhein @ [phone]

## 2023-03-17 NOTE — Telephone Encounter (Signed)
Called and discussed with daughter. Recommend going to ED if O2 sustains <88% on either RA or BiPAP. If stable then we will try to see in office for acute visit. Can we try to arrange office visit for acute visit with a provider at [facility] (per pt's daughter request) or if not available then APP or any MD at [facility] location?  THanks!

## 2023-03-18 ENCOUNTER — Encounter (HOSPITAL_COMMUNITY): Payer: Self-pay

## 2023-03-18 ENCOUNTER — Other Ambulatory Visit: Payer: Self-pay

## 2023-03-18 ENCOUNTER — Emergency Department (HOSPITAL_COMMUNITY)
Admission: EM | Admit: 2023-03-18 | Discharge: 2023-03-18 | Disposition: A | Discharge status: Home / Self Care | Payer: Medicare Other | Attending: Emergency Medicine | Admitting: Emergency Medicine

## 2023-03-18 ENCOUNTER — Emergency Department (HOSPITAL_COMMUNITY): Payer: Medicare Other

## 2023-03-18 DIAGNOSIS — E860 Dehydration: Secondary | ICD-10-CM | POA: Insufficient documentation

## 2023-03-18 DIAGNOSIS — R0602 Shortness of breath: Secondary | ICD-10-CM | POA: Diagnosis present

## 2023-03-18 DIAGNOSIS — G1221 Amyotrophic lateral sclerosis: Secondary | ICD-10-CM | POA: Diagnosis not present

## 2023-03-18 HISTORY — DX: Amyotrophic lateral sclerosis: G12.21

## 2023-03-18 LAB — CBC WITH DIFFERENTIAL/PLATELET
Abs Immature Granulocytes: 0.02 10*3/uL (ref 0.00–0.07)
Basophils Absolute: 0 10*3/uL (ref 0.0–0.1)
Basophils Relative: 0 %
Eosinophils Absolute: 0.1 10*3/uL (ref 0.0–0.5)
Eosinophils Relative: 1 %
HCT: 36.7 % (ref 36.0–46.0)
Hemoglobin: 11.6 g/dL — ABNORMAL LOW (ref 12.0–15.0)
Immature Granulocytes: 0 %
Lymphocytes Relative: 21 %
Lymphs Abs: 1.6 10*3/uL (ref 0.7–4.0)
MCH: 29.2 pg (ref 26.0–34.0)
MCHC: 31.6 g/dL (ref 30.0–36.0)
MCV: 92.4 fL (ref 80.0–100.0)
Monocytes Absolute: 0.8 10*3/uL (ref 0.1–1.0)
Monocytes Relative: 10 %
Neutro Abs: 5.1 10*3/uL (ref 1.7–7.7)
Neutrophils Relative %: 68 %
Platelets: 216 10*3/uL (ref 150–400)
RBC: 3.97 MIL/uL (ref 3.87–5.11)
RDW: 15.9 % — ABNORMAL HIGH (ref 11.5–15.5)
WBC: 7.6 10*3/uL (ref 4.0–10.5)
nRBC: 0 % (ref 0.0–0.2)

## 2023-03-18 LAB — URINALYSIS, ROUTINE W REFLEX MICROSCOPIC
Bilirubin Urine: NEGATIVE
Glucose, UA: NEGATIVE mg/dL
Hgb urine dipstick: NEGATIVE
Ketones, ur: 20 mg/dL — AB
Nitrite: NEGATIVE
Protein, ur: NEGATIVE mg/dL
Specific Gravity, Urine: 1.02 (ref 1.005–1.030)
pH: 5 (ref 5.0–8.0)

## 2023-03-18 LAB — COMPREHENSIVE METABOLIC PANEL
ALT: 23 U/L (ref 0–44)
AST: 31 U/L (ref 15–41)
Albumin: 3.6 g/dL (ref 3.5–5.0)
Alkaline Phosphatase: 47 U/L (ref 38–126)
Anion gap: 9 (ref 5–15)
BUN: 32 mg/dL — ABNORMAL HIGH (ref 8–23)
CO2: 31 mmol/L (ref 22–32)
Calcium: 9.2 mg/dL (ref 8.9–10.3)
Chloride: 99 mmol/L (ref 98–111)
Creatinine, Ser: 0.66 mg/dL (ref 0.44–1.00)
GFR, Estimated: >60 mL/min (ref >60)
Glucose, Bld: 99 mg/dL (ref 70–99)
Potassium: 3.4 mmol/L — ABNORMAL LOW (ref 3.5–5.1)
Sodium: 139 mmol/L (ref 135–145)
Total Bilirubin: 0.3 mg/dL (ref 0.3–1.2)
Total Protein: 6.4 g/dL — ABNORMAL LOW (ref 6.5–8.1)

## 2023-03-18 LAB — BLOOD GAS, VENOUS
Acid-Base Excess: 9.2 mmol/L — ABNORMAL HIGH (ref 0.0–2.0)
Bicarbonate: 37.4 mmol/L — ABNORMAL HIGH (ref 20.0–28.0)
Drawn by: 4237
O2 Saturation: 43.8 %
Patient temperature: 36.7
pCO2, Ven: 70 mmHg — ABNORMAL HIGH (ref 44–60)
pH, Ven: 7.33 (ref 7.25–7.43)
pO2, Ven: <31 mmHg — CL (ref 32–45)

## 2023-03-18 MED ORDER — LACTATED RINGERS IV BOLUS
1000.0000 mL | Freq: Once | INTRAVENOUS | Status: AC
  Administered 2023-03-18: 1000 mL via INTRAVENOUS

## 2023-03-18 MED ORDER — POTASSIUM CHLORIDE 10 MEQ/100ML IV SOLN
10.0000 meq | Freq: Once | INTRAVENOUS | Status: AC
  Administered 2023-03-18: 10 meq via INTRAVENOUS
  Filled 2023-03-18: qty 100

## 2023-03-18 NOTE — ED Notes (Signed)
Pt's family member at bedside states that pt is on cpap at night and/or when she lies flat sometimes, but is on 2 L [place] at baseline

## 2023-03-18 NOTE — ED Provider Notes (Signed)
[facility] EMERGENCY DEPARTMENT AT [facility] Provider Note   CSN: [id number] Arrival date & time: 03/18/23  1430     History  Chief Complaint  Patient presents with   Weakness    Rebecca Osborn is a 82 y.o. female.  HPI    Patient comes in with chief complaint of shortness of breath. Patient has known history of ALS.  She requires BiPAP at nighttime.  Patient accompanied by daughter, who provides substantial part of the history.  Per patient's daughter, patient has been having increasing weakness over the last 5 to 7 days.  Patient has chronic abdominal pain, but she has been having reduced p.o. intake.  Patient has complained of burning with urination, but that is also chronic for her typically.  There is no new cough.  Patient has been struggling with fluids lately, therefore she has not had significant water.  They have been giving her water via spoon.  Home Medications Prior to Admission medications   Medication Sig Start Date End Date Taking? Authorizing Provider  albuterol (PROVENTIL) (2.5 MG/3ML) 0.083% nebulizer solution Take 2.5 mg by nebulization every 6 (six) hours as needed for wheezing or shortness of breath. 04/25/22  Yes [provider]  albuterol (VENTOLIN HFA) 108 (90 Base) MCG/ACT inhaler Inhale 1-2 puffs into the lungs every 4 (four) hours as needed for wheezing or shortness of breath. 08/27/21  Yes [provider]  bisoprolol-hydrochlorothiazide (ZIAC) 2.5-6.25 MG tablet Take 1 tablet by mouth daily.   Yes [provider]  cetirizine (ZYRTEC) 10 MG tablet Take 10 mg by mouth daily as needed for allergies. 07/05/21  Yes [provider]  diclofenac Sodium (VOLTAREN) 1 % GEL Apply 2-4 g topically 4 (four) times daily as needed (joint pain). 02/27/22  Yes [provider]  fluticasone (FLONASE) 50 MCG/ACT nasal spray Place 1 spray into both nostrils daily. 06/23/21  Yes [provider]  gabapentin  (NEURONTIN) 100 MG capsule Take 100-200 mg by mouth See admin instructions. Take 100 mg in the morning and 200 mg every evening. 11/18/22  Yes [provider]  levothyroxine (SYNTHROID, LEVOTHROID) 88 MCG tablet Take 88 mcg by mouth daily.   Yes [provider]  LIDOCAINE EX Apply 1 patch topically daily as needed (stomach pain).   Yes [provider]  LORazepam (ATIVAN) 1 MG tablet Take 1 mg by mouth 3 (three) times daily.   Yes [provider]  montelukast (SINGULAIR) 10 MG tablet Take 10 mg by mouth at bedtime. 10/27/21  Yes [provider]  pantoprazole (PROTONIX) 40 MG tablet Take 40 mg by mouth daily.   Yes [provider]  riluzole (RILUTEK) 50 MG tablet Take 50 mg by mouth every 12 (twelve) hours. 12/24/22  Yes [provider]  rOPINIRole (REQUIP) 0.5 MG tablet Take 0.5 mg by mouth at bedtime as needed (restless legs/cramps). 10/27/21  Yes [provider]  sucralfate (CARAFATE) 1 g tablet Take 1 g by mouth 2 (two) times daily. 10/16/22  Yes [provider]  triamcinolone cream (KENALOG) 0.1 % Apply to affected area BID PRN 10/16/22  Yes [provider]  Baclofen 5 MG TABS Take 1 tablet by mouth daily. Patient not taking: Reported on 03/18/2023 02/25/23   [provider]  glycopyrrolate (ROBINUL) 1 MG tablet Take by mouth. Patient not taking: Reported on 03/18/2023 11/20/22   [provider]  HYDROcodone-acetaminophen (NORCO/VICODIN) 5-325 MG tablet Take 1 tablet by mouth daily as needed. Patient not  taking: Reported on 03/18/2023 10/19/22   [provider]  isosorbide mononitrate (IMDUR) 60 MG 24 hr tablet Take by mouth. Patient not taking: Reported on 03/18/2023 09/30/22   [provider]  Misc. Devices (ROLLATOR ULTRA-LIGHT) MISC Rollator style walker 01/07/22   Sater, Pearletha Furl, MD  sertraline (ZOLOFT) 50 MG tablet Take by mouth. Patient not taking: Reported on 03/18/2023  12/23/22   [provider]  Spacer/Aero-Holding Deretha Emory DEVI 1 each by Does not apply route in the morning and at bedtime. Please use with MDI inhalers 11/04/21   Omar Person, MD      Allergies    Penicillins, Pork-derived products, and Nsaids    Review of Systems   Review of Systems  All other systems reviewed and are negative.   Physical Exam Updated Vital Signs BP 128/72   Pulse 86   Temp 98 F (36.7 C) (Oral)   Resp (!) 27   Ht 5' (1.524 m)   Wt 67.1 kg   SpO2 94%   BMI 28.90 kg/m  Physical Exam Vitals and nursing note reviewed.  Constitutional:      Appearance: She is well-developed.  HENT:     Head: Atraumatic.     Mouth/Throat:     Mouth: Mucous membranes are dry.  Eyes:     Extraocular Movements: Extraocular movements intact.     Pupils: Pupils are equal, round, and reactive to light.  Cardiovascular:     Rate and Rhythm: Normal rate.  Pulmonary:     Effort: Pulmonary effort is normal.  Abdominal:     Palpations: Abdomen is soft.     Tenderness: There is abdominal tenderness.  Musculoskeletal:     Cervical back: Normal range of motion and neck supple.  Skin:    General: Skin is dry.  Neurological:     Mental Status: She is alert and oriented to person, place, and time.     ED Results / Procedures / Treatments   Labs (all labs ordered are listed, but only abnormal results are displayed) Labs Reviewed  COMPREHENSIVE METABOLIC PANEL - Abnormal; Notable for the following components:      Result Value   Potassium 3.4 (*)    BUN 32 (*)    Total Protein 6.4 (*)    All other components within normal limits  CBC WITH DIFFERENTIAL/PLATELET - Abnormal; Notable for the following components:   Hemoglobin 11.6 (*)    RDW 15.9 (*)    All other components within normal limits  URINALYSIS, ROUTINE W REFLEX MICROSCOPIC - Abnormal; Notable for the following components:   APPearance HAZY (*)    Ketones, ur 20 (*)    Leukocytes,Ua SMALL (*)     Bacteria, UA RARE (*)    All other components within normal limits  BLOOD GAS, VENOUS - Abnormal; Notable for the following components:   pCO2, Ven 70 (*)    pO2, Ven <31 (*)    Bicarbonate 37.4 (*)    Acid-Base Excess 9.2 (*)    All other components within normal limits    EKG None  Radiology DG Chest Port 1 View  Result Date: 03/18/2023 CLINICAL DATA:  Shortness of breath.  History of ALS. EXAM: PORTABLE CHEST 1 VIEW COMPARISON:  Chest radiographs 05/26/2022, 05/25/2022, 05/04/2020; CT chest 05/25/2022 FINDINGS: There are again moderately decreased lung volumes with bilateral horizontal streaky densities overlying the bilateral hemidiaphragms. Cardiac silhouette is again at the upper limits of normal size. Mediastinal contours are within normal limits. No  new acute airspace opacity. No pleural effusion or pneumothorax. No acute skeletal abnormality. IMPRESSION: 1. Moderately decreased lung volumes with bibasilar atelectasis, unchanged from multiple prior studies. No acute airspace opacity. 2. Cardiac silhouette at the upper limits of normal size. Electronically Signed   By: Neita Garnet M.D.   On: 03/18/2023 17:03    Procedures Procedures    Medications Ordered in ED Medications  lactated ringers bolus 1,000 mL (1,000 mLs Intravenous Bolus 03/18/23 1717)  lactated ringers bolus 1,000 mL (1,000 mLs Intravenous Bolus 03/18/23 1900)  potassium chloride 10 mEq in 100 mL IVPB (0 mEq Intravenous Stopped 03/18/23 1957)    ED Course/ Medical Decision Making/ A&P                             Medical Decision Making Amount and/or Complexity of Data Reviewed Labs: ordered. Radiology: ordered.  Risk Prescription drug management.   82 year old patient comes in with chief complaint of weakness, shortness of breath.  She has history of ALS.  I have reviewed care everywhere and outside neurology notes.  I have also received history from patient's daughter, who is at the bedside.  On exam  patient is dry.  History is not indicative of any specific source of infection.  She does have some burning with urination, but it is not entirely new.  She does not get frequent UTIs.  Patient currently does not have a G-tube, but that is something that the family has planned to get in the next few days.  Currently patient looks dehydrated.  Differential diagnosis considered for her includes dehydration, AKI, electrolyte abnormality, hypercapnia, hypoxia, aspiration.  Plan is to get basic labs, chest x-ray and reassess the patient.   Reassessment: Labs independently reviewed.  Patient does not have profound leukocytosis.  Her urine analysis does not show any evidence of infection.   She does have BUN/creatinine ratio over 20, and looks dry on exam.  Will give her 2 L of IV fluid.  Patient's blood gas reveals pH of 7.33 with pCO2 of 70.  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  I did have RT come in for inspiratory evaluation, but patient was not able to participate well.  We will reassess her again to see how she is doing. Family is comfortable taking patient home.  Reassessment: Patient reassessed again. She is more alert, active. P.o. challenge initiated.  Family has brought food from home. Anticipate discharge at this time.  Final Clinical Impression(s) / ED Diagnoses Final diagnoses:  Dehydration  ALS (amyotrophic lateral sclerosis)    Rx / DC Orders ED Discharge Orders     None         Derwood Kaplan, MD 03/19/23 2145

## 2023-03-18 NOTE — Discharge Instructions (Addendum)
Rebecca Osborn does not have any evidence of urinary tract infection.  Her labs are overall reassuring besides evidence of dehydration.  Renal function is still normal.  Her blood gas revealed elevated slightly elevated level of carbon dioxide, but it appears not to be at toxic levels.  Not to be at toxic levels.  Still, it is prudent that we keep close eye on her mental status.  If you start noticing further sleepiness, confusion then bring her to the emergency room immediately.

## 2023-03-18 NOTE — Progress Notes (Signed)
Attempted to get NIF/VC on patient.  She is unable to keep her mouth tightly on mouthpieces of the devices to get any thing to register.  She is noticeably weak and not sure if lack of muscle strength to do so or just can't comprehend how to properly perform the test.

## 2023-03-18 NOTE — ED Triage Notes (Signed)
Patient from home for weakness and fluctuating oxygen saturations. Patient diagnosed with ALS in November. Patient also reports abd pain. Upon arrival to ER, patient is alert and oriented; wears 2L of oxygen at baseline.

## 2023-03-18 NOTE — Telephone Encounter (Signed)
Called and spoke w/ pt daughter she verbalized that she is going to take her mother to the Ed @ [facility] due to her O2 saturations being low. I provided her with an appt in [place] tomorrow @ 3:45pm since there in none in [place].   Daughter verbalized understanding. NFN att

## 2023-03-19 ENCOUNTER — Ambulatory Visit: Payer: Medicare Other | Admitting: Emergency Medicine

## 2023-08-31 DEATH — deceased
